# Patient Record
Sex: Male | Born: 1955 | Race: White | Hispanic: No | Smoking: Never smoker
Health system: Southern US, Community
[De-identification: ages and names within clinical notes are randomized; demographics above are authoritative.]

## PROBLEM LIST (undated history)

## (undated) DIAGNOSIS — K219 Gastro-esophageal reflux disease without esophagitis: Secondary | ICD-10-CM

## (undated) DIAGNOSIS — E785 Hyperlipidemia, unspecified: Secondary | ICD-10-CM

## (undated) DIAGNOSIS — R002 Palpitations: Secondary | ICD-10-CM

## (undated) HISTORY — DX: Hyperlipidemia, unspecified: E78.5

## (undated) HISTORY — DX: Palpitations: R00.2

## (undated) HISTORY — DX: Gastro-esophageal reflux disease without esophagitis: K21.9

## (undated) HISTORY — PX: NASAL SINUS SURGERY: SHX719

## (undated) HISTORY — PX: OTHER SURGICAL HISTORY: SHX169

---

## 2000-12-13 ENCOUNTER — Encounter: Payer: Self-pay | Admitting: *Deleted

## 2000-12-13 ENCOUNTER — Encounter: Admission: RE | Admit: 2000-12-13 | Discharge: 2000-12-13 | Payer: Self-pay | Admitting: *Deleted

## 2000-12-14 ENCOUNTER — Ambulatory Visit (HOSPITAL_COMMUNITY): Admission: RE | Admit: 2000-12-14 | Discharge: 2000-12-14 | Payer: Self-pay | Admitting: *Deleted

## 2002-06-04 ENCOUNTER — Encounter: Payer: Self-pay | Admitting: Orthopedic Surgery

## 2002-06-04 ENCOUNTER — Ambulatory Visit (HOSPITAL_COMMUNITY): Admission: RE | Admit: 2002-06-04 | Discharge: 2002-06-04 | Payer: Self-pay | Admitting: Orthopedic Surgery

## 2004-05-04 ENCOUNTER — Encounter: Admission: RE | Admit: 2004-05-04 | Discharge: 2004-05-04 | Payer: Self-pay | Admitting: Family Medicine

## 2006-09-21 ENCOUNTER — Encounter: Admission: RE | Admit: 2006-09-21 | Discharge: 2006-09-21 | Payer: Self-pay | Admitting: Family Medicine

## 2007-06-24 ENCOUNTER — Encounter: Payer: Self-pay | Admitting: Cardiovascular Disease

## 2010-12-16 ENCOUNTER — Encounter: Payer: Self-pay | Admitting: Cardiovascular Disease

## 2010-12-16 ENCOUNTER — Ambulatory Visit (INDEPENDENT_AMBULATORY_CARE_PROVIDER_SITE_OTHER): Payer: Managed Care, Other (non HMO) | Admitting: Cardiovascular Disease

## 2010-12-16 DIAGNOSIS — Z87898 Personal history of other specified conditions: Secondary | ICD-10-CM | POA: Insufficient documentation

## 2010-12-16 DIAGNOSIS — R002 Palpitations: Secondary | ICD-10-CM

## 2010-12-22 NOTE — Assessment & Plan Note (Signed)
Summary: palpitations/[facility] [phone]/pt [phone]/mt   Visit Type:  Initial Consult Primary Provider:  Dr. Cam Hai  CC:  palpitations.  History of Present Illness: 55 yo WM with history of hyperlipidemia, GERD who is here today as a self referral for evaluation of palpitations. He tells me that he has noticed his heart skipping beats in the past. He has had a workup at [facility] in the past. He exercised on the treadmill several years ago and had no ischemia. 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 He describes skipped beats but no chest pain, SOB, near syncope or syncope. He notices skipped beats several times per week. He does drink several caffeinated sodas per day. He avoids any stimulants.   Current Medications (verified): 1)  Acyclovir 400 Mg Tabs (Acyclovir) .Marland Kitchen.. 1 Tab Two Times A Day 2)  Protonix 40 Mg Tbec (Pantoprazole Sodium) .... Take 1 Tablet By Mouth Once A Day 3)  Atorvastatin Calcium 10 Mg Tabs (Atorvastatin Calcium) .... 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  The patient denies fatigue, malaise, fever, weight gain/loss, vision loss, decreased hearing, hoarseness, chest pain, shortness of breath, prolonged cough, wheezing, sleep apnea, coughing up blood, abdominal pain, blood in stool, nausea, vomiting, diarrhea, heartburn, incontinence, blood in urine, muscle weakness, joint pain, leg swelling, rash, skin lesions, headache, fainting, dizziness, depression, anxiety, enlarged lymph nodes, easy bruising or bleeding, and environmental allergies.    Vital  Signs:  Patient profile:   55 year old male Height:      69 inches Weight:      217.25 pounds BMI:     32.20 Pulse rate:   78 / minute Resp:     17 per minute BP sitting:   122 / 82  (left arm)  Vitals Entered By: Celestia Khat, CMA (December 16, 2010 10:24 AM)  Physical Exam  General:  General: Well developed, well nourished, NAD HEENT: OP clear, mucus membranes moist SKIN: warm, dry Neuro: No focal deficits Musculoskeletal: Muscle strength 5/5 all ext Psychiatric: Mood and affect normal Neck: No JVD, no carotid bruits, no thyromegaly, no lymphadenopathy. Lungs:Clear bilaterally, no wheezes, rhonci, crackles CV: RRR no murmurs, gallops rubs Abdomen: soft, NT, ND, BS present Extremities: No edema, pulses 2+.    EKG  Procedure date:  12/16/2010  Findings:      sinus rhythm.   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 Will arrange echo to assess LV function. He does not wish to wear a monitor at this time.   Orders: EKG w/ Interpretation (93000) Echocardiogram (Echo)  Patient Instructions: 1)  Your physician recommends that you schedule a follow-up appointment as needed. 2)  Your physician recommends that you continue on your current medications as directed. Please refer to the Current Medication list given to you today. 3)  Your physician has requested that you have an echocardiogram.  Echocardiography is a painless test that uses sound waves to create images of your heart. It provides your doctor with information about the size and shape of your heart and how well  your heart's chambers and valves are working.  This procedure takes approximately one hour. There are no restrictions for this procedure.

## 2010-12-26 ENCOUNTER — Ambulatory Visit (HOSPITAL_COMMUNITY): Payer: Managed Care, Other (non HMO) | Attending: Cardiovascular Disease

## 2010-12-26 DIAGNOSIS — K219 Gastro-esophageal reflux disease without esophagitis: Secondary | ICD-10-CM | POA: Insufficient documentation

## 2010-12-26 DIAGNOSIS — R002 Palpitations: Secondary | ICD-10-CM | POA: Insufficient documentation

## 2010-12-26 DIAGNOSIS — I519 Heart disease, unspecified: Secondary | ICD-10-CM | POA: Insufficient documentation

## 2011-01-11 ENCOUNTER — Telehealth: Payer: Self-pay | Admitting: Cardiovascular Disease

## 2011-01-11 DIAGNOSIS — R002 Palpitations: Secondary | ICD-10-CM

## 2011-01-11 NOTE — Telephone Encounter (Signed)
Patient has been experiencing more palpitations. They are coming off & on. No SOB or CP. Denies a lot of caffeine use. He is still well hydrated. Spoke with Dr. Clifton James and we will order a 48 hour holter monitor.

## 2011-01-13 ENCOUNTER — Telehealth: Payer: Self-pay

## 2011-01-13 NOTE — Telephone Encounter (Signed)
Patient  call back and said he is  not having  Any more palpitations and don't need monitor now.

## 2011-08-17 ENCOUNTER — Telehealth: Payer: Self-pay | Admitting: Cardiovascular Disease

## 2011-08-17 NOTE — Telephone Encounter (Signed)
New message  Pt said he was lifting weights and now his chest is hurting,he has no sob but he is concerned. He was taking tylenol and it goes away but comes back. Pleas call

## 2011-08-17 NOTE — Telephone Encounter (Signed)
Called stating he was lifting weights last week and ever since then has had some chest discomfort. No SOB, "no really chest pain". BP good. 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 Has taken Aleve once or twice and helps for awhile. Advised to take Aleve consistently for a few days. If pain doesn't resolve or gets more symptotic ie SOB,dizziness,chest pain then advised to go to ER. Otherwise he will call us back and will be worked in. Marland Kitchen

## 2011-08-23 ENCOUNTER — Encounter: Payer: Self-pay | Admitting: *Deleted

## 2011-08-24 ENCOUNTER — Ambulatory Visit (INDEPENDENT_AMBULATORY_CARE_PROVIDER_SITE_OTHER): Payer: Managed Care, Other (non HMO) | Admitting: Physician Assistant

## 2011-08-24 ENCOUNTER — Encounter: Payer: Self-pay | Admitting: Physician Assistant

## 2011-08-24 VITALS — BP 144/80 | HR 96 | Ht 70.0 in | Wt 223.0 lb

## 2011-08-24 DIAGNOSIS — R030 Elevated blood-pressure reading, without diagnosis of hypertension: Secondary | ICD-10-CM

## 2011-08-24 DIAGNOSIS — R079 Chest pain, unspecified: Secondary | ICD-10-CM

## 2011-08-24 DIAGNOSIS — E785 Hyperlipidemia, unspecified: Secondary | ICD-10-CM

## 2011-08-24 NOTE — Assessment & Plan Note (Signed)
Atypical > typical features.  He is adopted but knows his grandfather had CAD.  He has HLP and is intolerant to most meds due to muscle pain.  With his risk factors, I have suggested he undergo an ETT-Myoview.  If negative he can follow up PRN.

## 2011-08-24 NOTE — Assessment & Plan Note (Signed)
Managed by PCP.  I have suggest he try taking his statin 3 x a week to help with his side effects.  He will notify his PCP that he is trying this.

## 2011-08-24 NOTE — Assessment & Plan Note (Signed)
Took Mucinex D recently for his URI.  Likely related to this as prior BPs ok.

## 2011-08-24 NOTE — Patient Instructions (Addendum)
Your physician recommends that you schedule a follow-up appointment in: AS NEEDED  Your physician has requested that you have en exercise stress myoview DX [phone] CHEST PAIN. For further information please visit [email or web address] Please follow instruction sheet, as given.

## 2011-08-24 NOTE — Progress Notes (Signed)
History of Present Illness: PCP:  Dr. Cam Hai Primary Cardiologist:  Dr. Verne Carrow   Jim Graham is a 55 y.o. male with a h/o HLP and GERD.  He saw Dr. Clifton James in 3/12 for evaluation of palpitations.  He was apparently dx with PVCs when seen at [facility].  Echo 3/12: mild LVH, EF 60-65%, grade 1 diast dysfxn.  Patient called in after that visit with palpitations and was set up for a Holter but he canceled this as his symptoms improved.  He presents today for evaluation of chest pain.  He was placed on amoxicillin for a URI recently.  Since then, he has noted chest discomfort.  It is an ache or tightness.  Located anterior chest.  Radiates to bilateral shoulders and arms.  Pain does not occur with running.  He lifts weights and will note pain sometimes with this, but more often after finishing his workout.  No associated dyspnea, nausea or diaphoresis.  Pain lasts 1-2 hours.  Takes aleve with relief.  No syncope, orthopnea, PND, edema.    Past Medical History  Diagnosis Date  . Hyperlipidemia   . GERD (gastroesophageal reflux disease)   . Palpitations     Current Outpatient Prescriptions  Medication Sig Dispense Refill  . acyclovir (ZOVIRAX) 400 MG tablet Take 400 mg by mouth every 4 (four) hours while awake.        Marland Kitchen aspirin 81 MG tablet Take 81 mg by mouth daily.        . CRESTOR 5 MG tablet Take 1 tablet by mouth Daily.      . fluticasone (FLONASE) 50 MCG/ACT nasal spray as needed.      . multivitamin (THERAGRAN) per tablet Take 1 tablet by mouth daily.        Marland Kitchen NEXIUM 40 MG capsule Take 1 tablet by mouth Daily.        Allergies: No Known Allergies  History  Substance Use Topics  . Smoking status: Never Smoker   . Smokeless tobacco: Never Used  . Alcohol Use: No     Family History  Problem Relation Age of Onset  . Adopted: Yes  . Heart attack Paternal Grandfather      ROS:  Please see the history of present illness.   Gastrointestinal ROS: positive for -  blood in stools from hemorrhoids and diarrhea.  All other systems reviewed and negative.   Vital Signs: BP 144/80  Pulse 96  Ht 5\' 10"  (1.778 m)  Wt 223 lb (101.152 kg)  BMI 32.00 kg/m2  PHYSICAL EXAM: Well nourished, well developed, in no acute distress HEENT: normal Neck: no JVD Vascular: no carotid bruits Endocrine: no thyromegaly Cardiac:  normal S1, S2; RRR; no murmur Lungs:  clear to auscultation bilaterally, no wheezing, rhonchi or rales Abd: soft, nontender, no hepatomegaly Ext: no edema Skin: warm and dry Neuro:  CNs 2-12 intact, no focal abnormalities noted Psych: normal affect  EKG:  NSR, HR 96, no ischemic changes, no significant change since prior tracing  ASSESSMENT AND PLAN:

## 2011-09-01 ENCOUNTER — Telehealth: Payer: Self-pay | Admitting: Physician Assistant

## 2011-09-01 ENCOUNTER — Other Ambulatory Visit: Payer: Self-pay | Admitting: Physician Assistant

## 2011-09-01 ENCOUNTER — Telehealth: Payer: Self-pay | Admitting: *Deleted

## 2011-09-01 DIAGNOSIS — R079 Chest pain, unspecified: Secondary | ICD-10-CM

## 2011-09-01 NOTE — Telephone Encounter (Signed)
Ok Katrese Shell, PA-C  

## 2011-09-01 NOTE — Telephone Encounter (Signed)
PATIENT WAS SCHEDULED FOR A STRESS MYOVIEW ON 09/04/11.  [employer] DENIED DUE TO NORMAL EKG AND ABILITY TO WALK ON A TREADMILL.  PER SCOTT CHANGE TO A STRESS ECHO.  KEVIN JOHNSON SPOKE WITH THE PATIENT AND SCHEDULED HIM FOR A STRESS ECHO ON 09/11/11.  KEVIN STATES PATIENT WAS OKAY WITH THIS AND VERBALIZED UNDERSTANDING OF WHY THE TEST WAS CHANGED.

## 2011-09-01 NOTE — Telephone Encounter (Signed)
Wanted to note that Dr. Bosie Clos from [facility] doubled his nexium prescription from 40mg  to 80mg  daily his heartburn is still persistant. Please feel free to call back if you have any questions.  Danielle Rankin

## 2011-09-01 NOTE — Telephone Encounter (Signed)
Wanted to note that Dr. Bosie Clos from [facility] doubled his nexium prescription from 40mg  to 80mg  daily his heartburn is still persistant. Please feel free to call back if you have any questions.

## 2011-09-04 ENCOUNTER — Encounter (HOSPITAL_COMMUNITY): Payer: Managed Care, Other (non HMO) | Admitting: Radiology

## 2011-09-11 ENCOUNTER — Ambulatory Visit (HOSPITAL_BASED_OUTPATIENT_CLINIC_OR_DEPARTMENT_OTHER): Payer: Managed Care, Other (non HMO) | Admitting: Radiology

## 2011-09-11 ENCOUNTER — Ambulatory Visit (HOSPITAL_COMMUNITY): Payer: Managed Care, Other (non HMO) | Attending: Cardiology | Admitting: Radiology

## 2011-09-11 DIAGNOSIS — E785 Hyperlipidemia, unspecified: Secondary | ICD-10-CM | POA: Insufficient documentation

## 2011-09-11 DIAGNOSIS — R072 Precordial pain: Secondary | ICD-10-CM

## 2011-09-11 DIAGNOSIS — R002 Palpitations: Secondary | ICD-10-CM | POA: Insufficient documentation

## 2011-09-11 DIAGNOSIS — R0989 Other specified symptoms and signs involving the circulatory and respiratory systems: Secondary | ICD-10-CM

## 2011-09-11 DIAGNOSIS — R079 Chest pain, unspecified: Secondary | ICD-10-CM | POA: Insufficient documentation

## 2011-09-11 DIAGNOSIS — I4949 Other premature depolarization: Secondary | ICD-10-CM | POA: Insufficient documentation

## 2011-09-13 ENCOUNTER — Other Ambulatory Visit (HOSPITAL_COMMUNITY): Payer: Managed Care, Other (non HMO) | Admitting: Radiology

## 2011-10-18 ENCOUNTER — Other Ambulatory Visit: Payer: Self-pay | Admitting: Family Medicine

## 2011-10-18 DIAGNOSIS — R109 Unspecified abdominal pain: Secondary | ICD-10-CM

## 2011-10-20 ENCOUNTER — Ambulatory Visit
Admission: RE | Admit: 2011-10-20 | Discharge: 2011-10-20 | Disposition: A | Discharge status: Home / Self Care | Payer: Managed Care, Other (non HMO) | Source: Ambulatory Visit | Attending: Family Medicine | Admitting: Family Medicine

## 2011-10-20 DIAGNOSIS — R109 Unspecified abdominal pain: Secondary | ICD-10-CM

## 2012-04-16 ENCOUNTER — Other Ambulatory Visit: Payer: Self-pay | Admitting: Family Medicine

## 2012-04-16 DIAGNOSIS — M545 Low back pain: Secondary | ICD-10-CM

## 2012-04-17 ENCOUNTER — Ambulatory Visit
Admission: RE | Admit: 2012-04-17 | Discharge: 2012-04-17 | Disposition: A | Discharge status: Home / Self Care | Payer: Managed Care, Other (non HMO) | Source: Ambulatory Visit | Attending: Family Medicine | Admitting: Family Medicine

## 2012-04-17 DIAGNOSIS — M545 Low back pain: Secondary | ICD-10-CM

## 2012-07-01 ENCOUNTER — Ambulatory Visit (HOSPITAL_BASED_OUTPATIENT_CLINIC_OR_DEPARTMENT_OTHER)
Admission: RE | Admit: 2012-07-01 | Discharge: 2012-07-01 | Disposition: A | Discharge status: Home / Self Care | Payer: Self-pay | Source: Ambulatory Visit | Attending: Occupational Medicine | Admitting: Occupational Medicine

## 2012-07-01 ENCOUNTER — Other Ambulatory Visit: Payer: Self-pay | Admitting: Occupational Medicine

## 2012-07-01 DIAGNOSIS — Z0000 Encounter for general adult medical examination without abnormal findings: Secondary | ICD-10-CM

## 2013-08-22 ENCOUNTER — Ambulatory Visit (INDEPENDENT_AMBULATORY_CARE_PROVIDER_SITE_OTHER): Payer: 59 | Admitting: Psychology

## 2013-08-22 DIAGNOSIS — F331 Major depressive disorder, recurrent, moderate: Secondary | ICD-10-CM

## 2013-09-03 ENCOUNTER — Ambulatory Visit: Payer: Self-pay | Admitting: Psychology

## 2013-09-03 ENCOUNTER — Ambulatory Visit (INDEPENDENT_AMBULATORY_CARE_PROVIDER_SITE_OTHER): Payer: 59 | Admitting: Psychology

## 2013-09-03 DIAGNOSIS — F331 Major depressive disorder, recurrent, moderate: Secondary | ICD-10-CM

## 2014-10-14 ENCOUNTER — Ambulatory Visit (INDEPENDENT_AMBULATORY_CARE_PROVIDER_SITE_OTHER): Payer: 59 | Admitting: Psychology

## 2014-10-14 DIAGNOSIS — F332 Major depressive disorder, recurrent severe without psychotic features: Secondary | ICD-10-CM

## 2014-10-30 ENCOUNTER — Ambulatory Visit (INDEPENDENT_AMBULATORY_CARE_PROVIDER_SITE_OTHER): Payer: 59 | Admitting: Psychology

## 2014-10-30 DIAGNOSIS — F332 Major depressive disorder, recurrent severe without psychotic features: Secondary | ICD-10-CM

## 2014-11-10 ENCOUNTER — Ambulatory Visit (INDEPENDENT_AMBULATORY_CARE_PROVIDER_SITE_OTHER): Payer: Managed Care, Other (non HMO)

## 2014-11-10 VITALS — BP 153/93 | HR 86 | Resp 13 | Ht 70.0 in | Wt 220.0 lb

## 2014-11-10 DIAGNOSIS — M7732 Calcaneal spur, left foot: Secondary | ICD-10-CM

## 2014-11-10 DIAGNOSIS — M79672 Pain in left foot: Secondary | ICD-10-CM

## 2014-11-10 DIAGNOSIS — M722 Plantar fascial fibromatosis: Secondary | ICD-10-CM

## 2014-11-10 NOTE — Progress Notes (Signed)
   Subjective:    Patient ID: Jim Graham, male    DOB: 1955/10/24, 59 y.o.   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  Dr. Elijah Birkom has injected the left heel 6 times per pt, and ultrasound therapy, and prescription orthotics without relief.     Review of Systems  Musculoskeletal: Positive for back pain.       Back pain and taking Aleve bid.  All other systems reviewed and are negative.      Objective:   Physical Exam 59 year old white male well-developed well-nourished oriented 3 presents this time with neurovascular status is intact pedal pulses are palpable epicritic and proprioceptive sensations intact and symmetric. Patient been seen by Dr. Maisie Fushomas had 6 steroid injections for his inferior left heel pain on exam at this time indicates is actually feeling better still has tenderness and mid arch medial band of the plantar fascia left right is however this is compensatory due to gait changes and walking on his toes. There is x-rays reveal well-developed inferior calcaneal spur mild fascial thickening rectus foot type mild rotatory changes noted on weightbearing clinically and radiographically. Patient is wearing orthoses however on exam orthotics did not contour well to the foot and arch patient did a standing bio foam impression and I feel captured a pronated foot position rather than a neutral position. At this time orthotics are modified with a felt padding to the medial arch of both orthotics left more so than right and patient was placed in a fascial strapping.       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 However if it is improved and successful biomechanical support new orthoses or adjustment orthotic may be beneficial in the future. Reassess with the next 2 weeks for follow-up  Alvan Dameichard Josiane Labine DPM

## 2014-11-10 NOTE — Patient Instructions (Signed)

## 2014-11-20 ENCOUNTER — Ambulatory Visit (INDEPENDENT_AMBULATORY_CARE_PROVIDER_SITE_OTHER): Payer: 59 | Admitting: Psychology

## 2014-11-20 DIAGNOSIS — F332 Major depressive disorder, recurrent severe without psychotic features: Secondary | ICD-10-CM

## 2014-12-01 ENCOUNTER — Ambulatory Visit (INDEPENDENT_AMBULATORY_CARE_PROVIDER_SITE_OTHER): Payer: Managed Care, Other (non HMO)

## 2014-12-01 VITALS — BP 126/82 | HR 90 | Resp 12

## 2014-12-01 DIAGNOSIS — M7732 Calcaneal spur, left foot: Secondary | ICD-10-CM

## 2014-12-01 DIAGNOSIS — M722 Plantar fascial fibromatosis: Secondary | ICD-10-CM

## 2014-12-01 DIAGNOSIS — M79672 Pain in left foot: Secondary | ICD-10-CM

## 2014-12-01 NOTE — Progress Notes (Signed)
   Subjective:    Patient ID: Jim Graham, male    DOB: 1956-05-20, 59 y.o.   MRN: [id number]  HPI  ''LT FOOT IS DOING MUCH BETTER BUT STILL GET A LITTLE SORE,  Review of Systems no new findings or systemic changes noted     Objective:   Physical Exam Neurovascular status unchanged pedal pulses are palpable the strapping has significant improvement both heels are definitely much better than they were based on the fact that the strapping in the orthotic adjustment was beneficial patient is a strong candidate for new orthoses as alternative to surgery at this time orthotic scan will be carried out with a new functional Sorbothane type orthotic. The orthotic with a high arch with no extrinsic post. If successful, will likely not require surgery for his heel pain. At this time fascial strapping reapplied on an as-needed basis until orthotics arrive orthotic scan carried out per patient request at this time.        AAssessment plantar fasciitis/heel spur syndrome bilateral orthotic scan carried out per patient request may be not be covered by his insurance R patient is prepared to pay if available and successful will be able to avoid surgery however if not successful long-term patient may still require EPF procedure. Reevaluate within 3 or 4 weeks once orthotics are ready for fitting and dispensing  Alvan DameRichard Taleeya Graham Graham Neosho HospitalDPMssessment & Plan:

## 2014-12-01 NOTE — Patient Instructions (Signed)
ICE INSTRUCTIONS  Apply ice or cold pack to the affected area at least 3 times a day for 10-15 minutes each time.  You should also use ice after prolonged activity or vigorous exercise.  Do not apply ice longer than 20 minutes at one time.  Always keep a cloth between your skin and the ice pack to prevent burns.  Being consistent and following these instructions will help control your symptoms.  We suggest you purchase a gel ice pack because they are reusable and do bit leak.  Some of them are designed to wrap around the area.  Use the method that works best for you.  Here are some other suggestions for icing.   Use a frozen bag of peas or corn-inexpensive and molds well to your body, usually stays frozen for 10 to 20 minutes.  Wet a towel with cold water and squeeze out the excess until it's damp.  Place in a bag in the freezer for 20 minutes. Then remove and use.  Follow-up with additional strapping if needed if there is a flareup until the orthotics arrive.

## 2014-12-28 ENCOUNTER — Encounter: Payer: Self-pay | Admitting: Internal Medicine

## 2014-12-28 ENCOUNTER — Ambulatory Visit (INDEPENDENT_AMBULATORY_CARE_PROVIDER_SITE_OTHER): Payer: Managed Care, Other (non HMO) | Admitting: Internal Medicine

## 2014-12-28 ENCOUNTER — Telehealth: Payer: Self-pay | Admitting: Internal Medicine

## 2014-12-28 VITALS — BP 130/98 | HR 69 | Ht 69.0 in | Wt 217.6 lb

## 2014-12-28 DIAGNOSIS — Z8679 Personal history of other diseases of the circulatory system: Secondary | ICD-10-CM

## 2014-12-28 DIAGNOSIS — E785 Hyperlipidemia, unspecified: Secondary | ICD-10-CM

## 2014-12-28 DIAGNOSIS — Z87898 Personal history of other specified conditions: Secondary | ICD-10-CM

## 2014-12-28 DIAGNOSIS — R0789 Other chest pain: Secondary | ICD-10-CM

## 2014-12-28 NOTE — Patient Instructions (Addendum)
Dr. Rennis GoldenHilty has scheduled you to have a CTA calcium score. This will be done at our [address] office. A cardiac CT scan is a non-invasive way of obtaining information about the presence, location and extent of calcified plaque in the coronary arteries, the vessels that supply oxygen-containing blood to the heart muscle.  Your physician wants you to follow-up in: 1 year or sooner if needed.You will receive a reminder letter in the mail two months in advance. If you don't receive a letter, please call our office to schedule the follow-up appointment.

## 2014-12-28 NOTE — Progress Notes (Signed)
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 He is also on chronic suppressive therapy for HSV. He was praised evaluated for atypical chest pain and palpitations in 2012. He underwent an exercise echocardiogram which was negative for ischemia. He also wore monitor for. At time. Symptoms improved and were related to stress. He does not know his medical history secondary to being adopted. Recently he's had more discomfort mostly in his left shoulder and some in the left upper chest but no evidence for substernal chest pain. He's also been having worsening symptoms which she attributes to reflux. He was switched from Nexium to Dexilant. In addition, he notes that when he takes Zantac he has marked improvement in his symptoms. 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  PMHx:  Past Medical History  Diagnosis Date  . Hyperlipidemia   . GERD (gastroesophageal reflux disease)   . Palpitations     Past Surgical History  Procedure Laterality Date  . Bicep re-attachment    . Nasal sinus surgery      FAMHx:  Family History  Problem Relation Age of Onset  . Adopted: Yes  . Heart attack Paternal Grandfather     SOCHx:   reports that he has never smoked. He has never used smokeless tobacco. He reports that he does not drink alcohol or use illicit drugs.  ALLERGIES:  No Known Allergies  ROS: A comprehensive review of systems was negative except for: Cardiovascular: positive for chest pain Gastrointestinal: positive for reflux symptoms  HOME MEDS: Current Outpatient Prescriptions  Medication Sig Dispense Refill  . acyclovir (ZOVIRAX) 400 MG tablet Take 400 mg by mouth every 4  (four) hours while awake.      Marland Kitchen aspirin 81 MG tablet Take 81 mg by mouth daily.      . CRESTOR 5 MG tablet Take 1 tablet by mouth Daily.    Marland Kitchen dexlansoprazole (DEXILANT) 60 MG capsule Take 60 mg by mouth daily.    . multivitamin (THERAGRAN) per tablet Take 1 tablet by mouth daily.      Marland Kitchen omega-3 acid ethyl esters (LOVAZA) 1 G capsule Take 1 capsule by mouth daily.    . valACYclovir (VALTREX) 500 MG tablet Take 1 tablet by mouth 2 (two) times daily.     No current facility-administered medications for this visit.    LABS/IMAGING: No results found for this or any previous visit (from the past 48 hour(s)). No results found.  VITALS: BP 130/98 mmHg  Pulse 69  Ht  (1.753 m)  Wt 217 lb 9.6 oz (98.703 kg)  BMI 32.12 kg/m2  EXAM: General appearance: alert and no distress Neck: no carotid bruit, no JVD and thyroid not enlarged, symmetric, no tenderness/mass/nodules Lungs: clear to auscultation bilaterally Heart: regular rate and rhythm, S1, S2 normal, no murmur, click, rub or gallop Abdomen: soft, non-tender; bowel sounds normal; no masses,  no organomegaly Extremities: extremities normal, atraumatic, no cyanosis or edema Pulses: 2+ and symmetric Skin: Skin color, texture, turgor normal. No rashes or lesions Neurologic: Grossly normal Psych: Mildly anxious  EKG:  Normal sinus rhythm at 69  ASSESSMENT: 1. Atypical left upper chest/shoulder pain 2. GERD 3. Dyslipidemia  PLAN: 1.   Jim Graham is reporting atypical chest discomfort which does not sound cardiac. He continues to exercise and does a lot of aerobic and strength training without any symptoms. This would be very unusual if his chest pain were cardiac in nature. I suspect is probably related to reflux, as he reports he gets some improvement in his symptoms when taking Zantac. I advised him it is okay to take both Zantac and Dexilant for his ongoing reflux symptoms. He should follow-up with his gastroenterologist. He is  interested as to whether or not he has any coronary disease.  I recommended a coronary calcium score to help better advise him. Should he have no coronary calcium, his risk would be very low, and we may be able to adjust his cholesterol medicine some. If he has a very high coronary calcium score, further risk stratification and risk medication would be necessary.  Thanks for allowing me to participate in his care.  Chrystie NoseKenneth C. Hilty, MD, [facility] HospitalFACC Attending Cardiologist [facility]  HILTY,Kenneth C 12/28/2014, 1:03 PM

## 2014-12-28 NOTE — Telephone Encounter (Signed)
Called and spoke with patient regarding CT [facility] [address]  Scheduled for 01/05/15 @ 8:30 am  Arrive at 8:15 for check in and be sure to bring $150.00 since insurance does not cover this service.  Patient voiced his understanding.

## 2015-01-01 ENCOUNTER — Ambulatory Visit: Payer: Managed Care, Other (non HMO) | Admitting: *Deleted

## 2015-01-01 DIAGNOSIS — M79673 Pain in unspecified foot: Secondary | ICD-10-CM

## 2015-01-01 DIAGNOSIS — M722 Plantar fascial fibromatosis: Secondary | ICD-10-CM

## 2015-01-01 NOTE — Patient Instructions (Signed)

## 2015-01-01 NOTE — Progress Notes (Signed)
Patient ID: Jim Graham, male   DOB: May 19, 1956, 59 y.o.   MRN: [id number] PICKING UP INSERTS

## 2015-01-05 ENCOUNTER — Ambulatory Visit (INDEPENDENT_AMBULATORY_CARE_PROVIDER_SITE_OTHER)
Admission: RE | Admit: 2015-01-05 | Discharge: 2015-01-05 | Disposition: A | Discharge status: Home / Self Care | Payer: Managed Care, Other (non HMO) | Source: Ambulatory Visit | Attending: Internal Medicine | Admitting: Internal Medicine

## 2015-01-05 DIAGNOSIS — E785 Hyperlipidemia, unspecified: Secondary | ICD-10-CM

## 2015-01-12 ENCOUNTER — Telehealth: Payer: Self-pay | Admitting: Internal Medicine

## 2015-01-12 NOTE — Telephone Encounter (Signed)
Called, attempted to speak to pt, line d/c'ed.

## 2015-01-12 NOTE — Telephone Encounter (Signed)
[place]ing [place] call from [place],concerning his test results.

## 2015-01-12 NOTE — Telephone Encounter (Signed)
Returned call to patient no answer.LMTC. 

## 2015-01-13 ENCOUNTER — Telehealth: Payer: Self-pay

## 2015-01-13 DIAGNOSIS — E785 Hyperlipidemia, unspecified: Secondary | ICD-10-CM

## 2015-01-13 NOTE — Telephone Encounter (Signed)
Pt is calling back to get the results to his CT CA score.

## 2015-01-13 NOTE — Telephone Encounter (Signed)
Returned call to patient received labs from PCP.Last Lipid Panel 06/11/14 LDL 93.Advised will need current lipid panel.Stated he will have a fasting lipid panel this [facility].

## 2015-01-13 NOTE — Telephone Encounter (Signed)
Received a call from patient Ct cardiac scoring results given.Appointment scheduled 02/08/15 with Dr.Hilty to discuss.Lab work with PCP requested.

## 2015-01-15 ENCOUNTER — Other Ambulatory Visit: Payer: Self-pay | Admitting: *Deleted

## 2015-01-15 ENCOUNTER — Telehealth: Payer: Self-pay | Admitting: Internal Medicine

## 2015-01-15 DIAGNOSIS — E785 Hyperlipidemia, unspecified: Secondary | ICD-10-CM

## 2015-01-15 LAB — LIPID PANEL
CHOL/HDL RATIO: 3.3 ratio
Cholesterol: 140 mg/dL (ref 0–200)
HDL: 43 mg/dL (ref >=40)
LDL Cholesterol: 84 mg/dL (ref 0–99)
Triglycerides: 66 mg/dL (ref <150)
VLDL: 13 mg/dL (ref 0–40)

## 2015-01-15 NOTE — Telephone Encounter (Signed)
Lab order released, [facility] confirmed.

## 2015-01-21 ENCOUNTER — Encounter: Payer: Self-pay | Admitting: Cardiovascular Disease

## 2015-02-08 ENCOUNTER — Ambulatory Visit (INDEPENDENT_AMBULATORY_CARE_PROVIDER_SITE_OTHER): Payer: Managed Care, Other (non HMO) | Admitting: Internal Medicine

## 2015-02-08 ENCOUNTER — Encounter: Payer: Self-pay | Admitting: Internal Medicine

## 2015-02-08 VITALS — BP 120/88 | HR 76 | Ht 70.0 in | Wt 195.3 lb

## 2015-02-08 DIAGNOSIS — E785 Hyperlipidemia, unspecified: Secondary | ICD-10-CM | POA: Diagnosis not present

## 2015-02-08 DIAGNOSIS — R938 Abnormal findings on diagnostic imaging of other specified body structures: Secondary | ICD-10-CM

## 2015-02-08 DIAGNOSIS — I77819 Aortic ectasia, unspecified site: Secondary | ICD-10-CM

## 2015-02-08 DIAGNOSIS — I77810 Thoracic aortic ectasia: Secondary | ICD-10-CM

## 2015-02-08 DIAGNOSIS — N529 Male erectile dysfunction, unspecified: Secondary | ICD-10-CM | POA: Insufficient documentation

## 2015-02-08 DIAGNOSIS — R918 Other nonspecific abnormal finding of lung field: Secondary | ICD-10-CM | POA: Diagnosis not present

## 2015-02-08 DIAGNOSIS — R030 Elevated blood-pressure reading, without diagnosis of hypertension: Secondary | ICD-10-CM | POA: Diagnosis not present

## 2015-02-08 DIAGNOSIS — N528 Other male erectile dysfunction: Secondary | ICD-10-CM

## 2015-02-08 DIAGNOSIS — R931 Abnormal findings on diagnostic imaging of heart and coronary circulation: Secondary | ICD-10-CM

## 2015-02-08 DIAGNOSIS — IMO0001 Reserved for inherently not codable concepts without codable children: Secondary | ICD-10-CM

## 2015-02-08 MED ORDER — SILDENAFIL CITRATE 20 MG PO TABS: 40.0000 mg | ORAL_TABLET | Freq: Every day | ORAL | Status: DC | PRN

## 2015-02-08 NOTE — Patient Instructions (Signed)
Your physician recommends that you return for lab work in: 2 months (fasting)  Non-Cardiac CT scanning, (CAT scanning), is a noninvasive, special x-ray that produces cross-sectional images of the body using x-rays and a computer. CT scans help physicians diagnose and treat medical conditions. For some CT exams, a contrast material is used to enhance visibility in the area of the body being studied. CT scans provide greater clarity and reveal more details than regular x-ray exams. >> this with be a CT of your chest done in 1 year ~ April 2017  Your physician wants you to follow-up in: 1 year with Dr. Rennis GoldenHilty. You will receive a reminder letter in the mail two months in advance. If you don't receive a letter, please call our office to schedule the follow-up appointment.

## 2015-02-08 NOTE — Progress Notes (Signed)
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 He is also on chronic suppressive therapy for HSV. He was praised evaluated for atypical chest pain and palpitations in 2012. He underwent an exercise echocardiogram which was negative for ischemia. He also wore monitor for. At time. Symptoms improved and were related to stress. He does not know his medical history secondary to being adopted. Recently he's had more discomfort mostly in his left shoulder and some in the left upper chest but no evidence for substernal chest pain. He's also been having worsening symptoms which she attributes to reflux. He was switched from Nexium to Dexilant. In addition, he notes that when he takes Zantac he has marked improvement in his symptoms. 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  The pleasure see Mr. Jim CantorStrimple back in the office today. Overall he is doing well denies any further chest pain or shortness of breath. In fact she's managed to lose about 20 pounds since his last office visit. He says he's on the [employer] diet. This is a combination of caloric restriction, hCG, other vitamins and appetite suppressants. He also continues to exercise. He had a lipid profile performed which showed total cholesterol 140, glyceride 66, HL 43, LDL 84. He's currently on Crestor 5 mg daily. The CT scan of his coronary Arteries was performed which demonstrated a low coronary artery calcium score of 46, but was located in the proximal to mid LAD. The aortic root was mildly dilated 4.0 cm. [facility] indicated several small subcentimeter pulmonary nodules, the largest being 5 mm. A repeat CT  scan was recommended in one year. He is not a smoker and at lower risk for bronchogenic carcinoma. In addition, today he reported some recent erectile dysfunction, which was unrelated to her follow-up. He is asking about Viagra.  PMHx:  Past Medical History  Diagnosis Date  . Hyperlipidemia   . GERD (gastroesophageal reflux disease)   . Palpitations     Past Surgical History  Procedure Laterality Date  . Bicep re-attachment    . Nasal sinus surgery      FAMHx:  Family History  Problem Relation Age of Onset  . Adopted: Yes  . Heart attack Paternal Grandfather     SOCHx:   reports that he has never smoked. He has never used smokeless tobacco. He reports that he does not drink alcohol or use illicit drugs.  ALLERGIES:  No Known Allergies  ROS: A comprehensive review of systems was negative except for: Genitourinary: positive for sexual problems  HOME MEDS: Current Outpatient Prescriptions  Medication Sig Dispense Refill  . aspirin 81 MG tablet Take 81 mg by mouth daily.      . CRESTOR 5 MG tablet Take 1 tablet by mouth Daily.    Marland Kitchen. dexlansoprazole (DEXILANT) 60 MG capsule Take 60 mg by mouth daily.    . multivitamin (THERAGRAN) per tablet Take 1 tablet by mouth daily.      Marland Kitchen. omega-3 acid ethyl esters (LOVAZA) 1 G capsule Take 1 capsule by mouth daily.    . valACYclovir (VALTREX) 500 MG tablet Take 1 tablet by mouth 2 (two) times daily.    .Marland Kitchen  sildenafil (REVATIO) 20 MG tablet Take 2-3 tablets (40-60 mg total) by mouth daily as needed. 50 tablet 1   No current facility-administered medications for this visit.    LABS/IMAGING: No results found for this or any previous visit (from the past 48 hour(s)). No results found.  VITALS: BP 120/88 mmHg  Pulse 76  Ht  (1.778 m)  Wt 195 lb 4.8 oz (88.587 kg)  BMI 28.02 kg/m2  EXAM: Deferred  EKG: Deferred  ASSESSMENT: 1. Atypical left upper chest/shoulder pain - low coronary calcium score  46 2. GERD 3. Dyslipidemia 4. Erectile dysfunction  PLAN: 1.   [name] has had improvement in his chest discomfort. This may been related to reflux. He says currently he is taking Dexilant as needed. I told him it may be better to take either Zantac or Pepcid on an as-needed basis. He continues to exercise without any exertional chest discomfort. He does have a low coronary artery calcium score however it is located in the proximal to mid LAD. A review his lipid profile shows there is room for further risk stratification. He's made major dietary changes and has had over 20 pound weight loss. He wishes to reassess his cholesterol in a couple of months. I like to obtain a lipid NMR for more detailed analysis. There may be room to increase his Crestor to lower particle numbers and/or LDL below 70. He does need strict cholesterol controlled her reduce his ongoing risk. He inquired today about using Viagra and I feel that's safe. I'll go ahead and give him a prescription for the 20 mg tablets to take 2 or 3 prior to sexual activity. He is not using any nitrates and was advised against using them together.  I will contact him with the results of his cholesterol profile a couple months and adjust his medication as necessary. I'll also go ahead and schedule him for repeat chest CT in one year to follow-up on his pulmonary nodules and borderline dilated aortic root.  Thanks for allowing me to participate in his care.  Jim Nose, MD, [facility] Attending Cardiologist [facility]  Jim Graham 02/08/2015, 9:39 AM

## 2015-03-05 DIAGNOSIS — M722 Plantar fascial fibromatosis: Secondary | ICD-10-CM

## 2015-05-17 ENCOUNTER — Telehealth: Payer: Self-pay | Admitting: Internal Medicine

## 2015-05-17 NOTE — Telephone Encounter (Signed)
Pt called in stating that he has been having some spasms in his chest which work their way down to both arms . He is concerned and he would like to come in and be seen tomorrow.   Thanks

## 2015-05-17 NOTE — Telephone Encounter (Signed)
Patient said that on Saturday while in grocery store started have a spasm feeling in chest and in his arms lasting 30 min  This morning he started with same symptoms.  Took Zantac and symptoms have improved.  Said he hasn't taken Dex lansoprazole in months and is thinking about restarting.  Stated he drank a lot of iced tea this weekend and is attributing his symptoms to this especially after improvement with Zantac  Has been working out like he typically does lifting weights 5 days a week.  Describes discomfort in arms like a muscle pull.  Denies any shortness of breath or chest pain  Advised if his symptoms start returning,  He becomes weak or short of breath or pain becomes worse he needs to go to the ED.  Will send message to Dr. Rennis Golden to see if he would like for him to follow up in the office or any or changes in his plan of care

## 2015-05-20 NOTE — Telephone Encounter (Signed)
Patient called back and said that his symptoms have improved but he would like to be seen to discuss with physician  York Spaniel that Joyce Gross was to get back to him to see if she could get him an appointment next week  Will route to [employer]

## 2015-05-20 NOTE — Telephone Encounter (Signed)
This patient is calling back today stating his symptoms are improved, but he feels he needs to be seen.  Please call patient and advise scheduling if this should be scheduled on the flex schedule.

## 2015-05-24 NOTE — Telephone Encounter (Signed)
Patient is scheduled 05/25/15

## 2015-05-25 ENCOUNTER — Ambulatory Visit (INDEPENDENT_AMBULATORY_CARE_PROVIDER_SITE_OTHER): Payer: Managed Care, Other (non HMO) | Admitting: Internal Medicine

## 2015-05-25 ENCOUNTER — Encounter: Payer: Self-pay | Admitting: Internal Medicine

## 2015-05-25 VITALS — BP 126/100 | HR 67 | Ht 70.0 in | Wt 197.1 lb

## 2015-05-25 DIAGNOSIS — E785 Hyperlipidemia, unspecified: Secondary | ICD-10-CM

## 2015-05-25 DIAGNOSIS — R938 Abnormal findings on diagnostic imaging of other specified body structures: Secondary | ICD-10-CM

## 2015-05-25 DIAGNOSIS — R931 Abnormal findings on diagnostic imaging of heart and coronary circulation: Secondary | ICD-10-CM

## 2015-05-25 DIAGNOSIS — R0789 Other chest pain: Secondary | ICD-10-CM | POA: Diagnosis not present

## 2015-05-25 DIAGNOSIS — K219 Gastro-esophageal reflux disease without esophagitis: Secondary | ICD-10-CM | POA: Diagnosis not present

## 2015-05-25 NOTE — Patient Instructions (Signed)
Dr. Rennis Golden will follow up with you in April 2017  Please take dexilant once daily You can take zantac in addition to this as necessary Please contact Dr. Bosie Clos for your heartburn

## 2015-05-25 NOTE — Progress Notes (Signed)
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 He is also on chronic suppressive therapy for HSV. He was praised evaluated for atypical chest pain and palpitations in 2012. He underwent an exercise echocardiogram which was negative for ischemia. He also wore monitor for. At time. Symptoms improved and were related to stress. He does not know his medical history secondary to being adopted. Recently he's had more discomfort mostly in his left shoulder and some in the left upper chest but no evidence for substernal chest pain. He's also been having worsening symptoms which she attributes to reflux. He was switched from Nexium to Dexilant. In addition, he notes that when he takes Zantac he has marked improvement in his symptoms. 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  The pleasure see [name] back in the office today. Overall he is doing well denies any further chest pain or shortness of breath. In fact she's managed to lose about 20 pounds since his last office visit. He says he's on the [employer]. This is a combination of caloric restriction, hCG, other vitamins and appetite suppressants. He also continues to exercise. He had a lipid profile performed which showed total cholesterol 140, glyceride 66, HL 43, LDL 84. He's currently on Crestor 5 mg daily. The CT scan of his coronary Arteries was performed which demonstrated a low coronary artery calcium score of 46, but was located in the proximal to mid LAD. The aortic root was mildly dilated 4.0 cm. An over read by [facility] radiology indicated several small subcentimeter pulmonary nodules, the largest being 5 mm. A repeat  CT scan was recommended in one year. He is not a smoker and at lower risk for bronchogenic carcinoma. In addition, today he reported some recent erectile dysfunction, which was unrelated to her follow-up. He is asking about Viagra.  [name] returns today for follow-up. Over the weekend he had an episode where he developed pain on the left and right sides of his chest laterally that radiated down his arms. There is no central chest discomfort. It seemed to occurred fairly recently after eating. He did take a Zantac and went to the emergency room. He said he waited outside of the emergency room and his symptoms resolved and he ultimately was never seen. He then started taking his Dexilant again. He has noted some improvement in his symptoms but is not completely resolved. He says he recently saw his gastroenterologist but was not having any symptoms at the time. He continues to exercise at a significant rate without any chest pain or shortness of breath.  PMHx:  Past Medical History  Diagnosis Date  . Hyperlipidemia   . GERD (gastroesophageal reflux disease)   . Palpitations     Past Surgical History  Procedure Laterality Date  . Bicep re-attachment    . Nasal sinus surgery      FAMHx:  Family History  Problem Relation Age of Onset  . Adopted: Yes  . Heart attack Paternal Grandfather     SOCHx:   reports that he has never smoked. He has never used smokeless tobacco. He reports that he does not drink alcohol or use illicit  drugs.  ALLERGIES:  No Known Allergies  ROS: A comprehensive review of systems was negative except for: Gastrointestinal: positive for dyspepsia and reflux symptoms  HOME MEDS: Current Outpatient Prescriptions  Medication Sig Dispense Refill  . aspirin 81 MG tablet Take 81 mg by mouth daily.      . CRESTOR 5 MG tablet Take 1 tablet by mouth Daily.    Marland Kitchen dexlansoprazole (DEXILANT) 60 MG capsule Take 60 mg by mouth daily.    . Multiple Vitamin (MULTIVITAMIN)  capsule Take 1 capsule by mouth daily.    . Omega-3 Fatty Acids (FISH OIL) 1200 MG CAPS Take 1 capsule by mouth daily.    . sildenafil (REVATIO) 20 MG tablet Take 2-3 tablets (40-60 mg total) by mouth daily as needed. 50 tablet 1  . valACYclovir (VALTREX) 500 MG tablet Take 1 tablet by mouth 2 (two) times daily.     No current facility-administered medications for this visit.    LABS/IMAGING: No results found for this or any previous visit (from the past 48 hour(s)). No results found.  VITALS: BP 126/100 mmHg  Pulse 67  Ht 5\' 10"  (1.778 m)  Wt 197 lb 1.6 oz (89.404 kg)  BMI 28.28 kg/m2  EXAM: General appearance: alert and no distress Lungs: clear to auscultation bilaterally Heart: regular rate and rhythm, S1, S2 normal, no murmur, click, rub or gallop Extremities: extremities normal, atraumatic, no cyanosis or edema  EKG: Normal sinus rhythm at 67, no ischemia   ASSESSMENT: 1. Atypical left upper chest/shoulder pain - low coronary calcium score 46 2. GERD 3. Dyslipidemia 4. Erectile dysfunction  PLAN: 1.   Mr. Feil had an episode of left and right sided chest pain with radiation to both arms, this was not associated with exertion and seem to improve with Zantac. He subsequently started taking Dexliant and his symptoms have improved but not yet totally resolved. He's only been on the medicine for a week. I've encouraged him to continue to take this as I feel that this is likely recurrent reflux. He could also take Zantac in addition to this. He should contact Dr. Bosie Clos to be reevaluated. If his symptoms persist or are associated with exertion or he noticed worsening exercise tolerance, I'm happy to see him back and we may consider invasive cardiac evaluation however I think this is very atypical for coronary disease, despite his coronary calcium.   Chrystie Nose, MD, [facility] Attending Cardiologist [facility]  Lisette Abu Hilty 05/25/2015, 1:26 PM

## 2015-05-28 ENCOUNTER — Ambulatory Visit: Payer: Self-pay | Admitting: Physician Assistant

## 2015-08-04 ENCOUNTER — Telehealth: Payer: Self-pay | Admitting: Internal Medicine

## 2015-08-04 MED ORDER — ROSUVASTATIN CALCIUM 10 MG PO TABS: 10.0000 mg | ORAL_TABLET | Freq: Every day | ORAL | Status: DC

## 2015-08-04 NOTE — Telephone Encounter (Signed)
Reviewed labs- cholesterol is higher. I would recommend increasing Crestor to 10 mg daily as we had discussed before.  Dr. HRexene Edison

## 2015-08-04 NOTE — Telephone Encounter (Signed)
Spoke with pt, aware no labs received at this time. Spoke with dr Alver Fishershaw's office, requested labs be faxed to our office.

## 2015-08-04 NOTE — Telephone Encounter (Signed)
Patient called and notified of MD advice. He voiced understanding.  Rx(s) sent to pharmacy electronically. Co-pay card left for patient to pick up.

## 2015-08-04 NOTE — Telephone Encounter (Signed)
Labs received and on MD cart for review

## 2015-08-04 NOTE — Addendum Note (Signed)
Addended by: Lindell SparELKINS, JENNA M on: 08/04/2015 05:48 PM   Modules accepted: Orders

## 2015-08-04 NOTE — Telephone Encounter (Signed)
Pt had lab work on 06-25-15 at Dr Alver FisherShaw's office. He wants to know if you received it,and what was his Cholesterol results?

## 2015-08-18 ENCOUNTER — Encounter: Payer: Self-pay | Admitting: Internal Medicine

## 2016-02-03 ENCOUNTER — Ambulatory Visit (INDEPENDENT_AMBULATORY_CARE_PROVIDER_SITE_OTHER): Payer: Managed Care, Other (non HMO) | Admitting: Internal Medicine

## 2016-02-03 ENCOUNTER — Encounter: Payer: Self-pay | Admitting: Internal Medicine

## 2016-02-03 VITALS — BP 126/96 | HR 82 | Ht 70.0 in | Wt 207.4 lb

## 2016-02-03 DIAGNOSIS — I77810 Thoracic aortic ectasia: Secondary | ICD-10-CM

## 2016-02-03 DIAGNOSIS — E785 Hyperlipidemia, unspecified: Secondary | ICD-10-CM

## 2016-02-03 DIAGNOSIS — R938 Abnormal findings on diagnostic imaging of other specified body structures: Secondary | ICD-10-CM | POA: Diagnosis not present

## 2016-02-03 DIAGNOSIS — R918 Other nonspecific abnormal finding of lung field: Secondary | ICD-10-CM | POA: Diagnosis not present

## 2016-02-03 DIAGNOSIS — R931 Abnormal findings on diagnostic imaging of heart and coronary circulation: Secondary | ICD-10-CM

## 2016-02-03 NOTE — Progress Notes (Signed)
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 He is also on chronic suppressive therapy for HSV. He was praised evaluated for atypical chest pain and palpitations in 2012. He underwent an exercise echocardiogram which was negative for ischemia. He also wore monitor for. At time. Symptoms improved and were related to stress. He does not know his medical history secondary to being adopted. Recently he's had more discomfort mostly in his left shoulder and some in the left upper chest but no evidence for substernal chest pain. He's also been having worsening symptoms which she attributes to reflux. He was switched from Nexium to Dexilant. In addition, he notes that when he takes Zantac he has marked improvement in his symptoms. 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  The pleasure see [name] back in the office today. Overall he is doing well denies any further chest pain or shortness of breath. In fact she's managed to lose about 20 pounds since his last office visit. He says he's on the [employer] diet. This is a combination of caloric restriction, hCG, other vitamins and appetite suppressants. He also continues to exercise. He had a lipid profile performed which showed total cholesterol 140, glyceride 66, HL 43, LDL 84. He's currently on Crestor 5 mg daily. The CT scan of his coronary Arteries was performed which demonstrated a low coronary artery calcium score of 46, but was located in the proximal to mid LAD. The aortic root was mildly dilated 4.0 cm. [facility] indicated several small subcentimeter pulmonary nodules, the largest being 5 mm. A  repeat CT scan was recommended in one year. He is not a smoker and at lower risk for bronchogenic carcinoma. In addition, today he reported some recent erectile dysfunction, which was unrelated to her follow-up. He is asking about Viagra.  [name] returns today for follow-up. Over the weekend he had an episode where he developed pain on the left and right sides of his chest laterally that radiated down his arms. There is no central chest discomfort. It seemed to occurred fairly recently after eating. He did take a Zantac and went to the emergency room. He said he waited outside of the emergency room and his symptoms resolved and he ultimately was never seen. He then started taking his Dexilant again. He has noted some improvement in his symptoms but is not completely resolved. He says he recently saw his gastroenterologist but was not having any symptoms at the time. He continues to exercise at a significant rate without any chest pain or shortness of breath.  [name] returns today for follow-up. He reports some occasional left shoulder pain but is able to exercise at a high rate without any chest pain or worsening shortness of breath. He exercises fairly regularly. He recently thought he was having side effects from his Crestor discontinued that however the symptoms did not go away and he restarted his medicine. He is due for repeat lipid profile. He is also due for one year follow-up of his last chest CT which demonstrated several small sub-pulmonary nodules. There was also a mildly dilated aortic root to 4.0 cm.  PMHx:  Past Medical History  Diagnosis Date  . Hyperlipidemia   . GERD (gastroesophageal reflux disease)   . Palpitations     Past Surgical History  Procedure Laterality Date  . Bicep re-attachment    . Nasal sinus surgery      FAMHx:  Family History  Problem Relation Age of Onset  . Adopted: Yes  . Heart attack Paternal Grandfather     SOCHx:   reports that he has  never smoked. He has never used smokeless tobacco. He reports that he does not drink alcohol or use illicit drugs.  ALLERGIES:  No Known Allergies  ROS: A comprehensive review of systems was negative except for: Gastrointestinal: positive for dyspepsia and reflux symptoms  HOME MEDS: Current Outpatient Prescriptions  Medication Sig Dispense Refill  . aspirin 81 MG tablet Take 81 mg by mouth daily.      . Multiple Vitamin (MULTIVITAMIN) capsule Take 1 capsule by mouth daily.    . Omega-3 Fatty Acids (FISH OIL) 1200 MG CAPS Take 1 capsule by mouth daily.    Marland Kitchen omeprazole (PRILOSEC) 40 MG capsule Take 40 mg by mouth daily as needed.    . rosuvastatin (CRESTOR) 10 MG tablet Take 1 tablet (10 mg total) by mouth daily. 30 tablet 11  . sildenafil (REVATIO) 20 MG tablet Take 2-3 tablets (40-60 mg total) by mouth daily as needed. 50 tablet 1  . valACYclovir (VALTREX) 500 MG tablet Take 1 tablet by mouth 2 (two) times daily.     No current facility-administered medications for this visit.    LABS/IMAGING: No results found for this or any previous visit (from the past 48 hour(s)). No results found.  VITALS: BP 126/96 mmHg  Pulse 82  Ht  (1.778 m)  Wt 207 lb 6.4 oz (94.076 kg)  BMI 29.76 kg/m2  EXAM: General appearance: alert and no distress Lungs: clear to auscultation bilaterally Heart: regular rate and rhythm, S1, S2 normal, no murmur, click, rub or gallop Extremities: extremities normal, atraumatic, no cyanosis or edema  EKG: Normal sinus rhythm at 82  ASSESSMENT: 1. Atypical left upper chest/shoulder pain - low coronary calcium score 46 2. GERD 3. Dyslipidemia 4. Erectile dysfunction 5. Mildly dilated aortic root to 4.0 cm 6. Subcentimeter pulmonary nodules  PLAN: 1.   Mr. Asato is generally asymptomatic. He continues to exercise without problems. He has on-and-off symptoms of reflux and he is back on Prilosec. He recently stopped and restarted his Crestor. He is  due for recheck of his lipid profile which were ordered today. He also is due for repeat CT scan of the chest to reassess his aortic root size as well as a subcentimeter pulmonary nodules to see if they've grown in size. Plan follow-up annually or sooner as necessary to all contacted with the results of the CT and lipid profile.  Chrystie Nose, MD, [facility] Attending Cardiologist [facility]  Lisette Abu Hilty 02/03/2016, 1:00 PM

## 2016-02-03 NOTE — Patient Instructions (Signed)
Your physician recommends that you return for lab work FASTING to check cholesterol   Non-Cardiac CT scanning, (CAT scanning), is a noninvasive, special x-ray that produces cross-sectional images of the body using x-rays and a computer. CT scans help physicians diagnose and treat medical conditions. For some CT exams, a contrast material is used to enhance visibility in the area of the body being studied. CT scans provide greater clarity and reveal more details than regular x-ray exams.  Your physician wants you to follow-up in: 1 year with Dr. Rennis GoldenHilty. You will receive a reminder letter in the mail two months in advance. If you don't receive a letter, please call our office to schedule the follow-up appointment.

## 2016-02-08 ENCOUNTER — Ambulatory Visit (INDEPENDENT_AMBULATORY_CARE_PROVIDER_SITE_OTHER)
Admission: RE | Admit: 2016-02-08 | Discharge: 2016-02-08 | Disposition: A | Discharge status: Home / Self Care | Payer: Managed Care, Other (non HMO) | Source: Ambulatory Visit | Attending: Internal Medicine | Admitting: Internal Medicine

## 2016-02-08 DIAGNOSIS — I77819 Aortic ectasia, unspecified site: Secondary | ICD-10-CM | POA: Diagnosis not present

## 2016-02-08 DIAGNOSIS — R918 Other nonspecific abnormal finding of lung field: Secondary | ICD-10-CM

## 2016-02-09 LAB — LIPID PANEL
Cholesterol: 126 mg/dL (ref 125–200)
HDL: 42 mg/dL (ref >=40)
LDL CALC: 70 mg/dL (ref <130)
TRIGLYCERIDES: 71 mg/dL (ref <150)
Total CHOL/HDL Ratio: 3 Ratio (ref <=5.0)
VLDL: 14 mg/dL (ref <30)

## 2016-02-10 ENCOUNTER — Telehealth: Payer: Self-pay | Admitting: Internal Medicine

## 2016-02-10 NOTE — Telephone Encounter (Signed)
Returned called to patient. Notified of lab & CT results.

## 2016-02-10 NOTE — Telephone Encounter (Signed)
New message    Pt is calling requesting to speak to Dr.hilty rn about test results for lab

## 2016-02-14 ENCOUNTER — Ambulatory Visit: Payer: Self-pay | Admitting: Internal Medicine

## 2016-02-21 ENCOUNTER — Telehealth: Payer: Self-pay | Admitting: Internal Medicine

## 2016-02-21 NOTE — Telephone Encounter (Signed)
Pt aware Dr. Rennis GoldenHilty has reviewed his results and does not feel there are any changes from previous studies but wait on radiology to review and give his opinion. Pt stated he is appreciative and would still like call back once radiology has reviewed.

## 2016-02-21 NOTE — Telephone Encounter (Signed)
Pt told that request was sent back to radiology by Dr. Rennis GoldenHilty to review his Aorta. Pt is very much awaiting results. Told I would sent this message to MD to see if there are any update. Pt very appreciative.

## 2016-02-21 NOTE — Telephone Encounter (Signed)
New message    Per pt returning the called about the CT results for aorta portion of the test. The pt verbalized he received everything else but the aorta portion of the test. Please call back to discuss.    Message sent by csy

## 2016-02-21 NOTE — Telephone Encounter (Signed)
I sent a message to the radiologist to amend the report, but he was on vacation last week. I personally reviewed the images and the aorta does not measure larger than last year (about 3.9-4 cm). I will notify him of the official report when it is received.  Dr. HRexene Edison

## 2016-02-22 NOTE — Telephone Encounter (Signed)
Patient called with CT results - addendum per radiologist showed stable aortic dilatation. Patient voiced understanding.

## 2016-02-25 ENCOUNTER — Other Ambulatory Visit: Payer: Self-pay | Admitting: *Deleted

## 2016-02-25 DIAGNOSIS — R911 Solitary pulmonary nodule: Secondary | ICD-10-CM

## 2016-02-25 DIAGNOSIS — I77819 Aortic ectasia, unspecified site: Secondary | ICD-10-CM

## 2016-04-05 ENCOUNTER — Ambulatory Visit (INDEPENDENT_AMBULATORY_CARE_PROVIDER_SITE_OTHER): Payer: 59 | Admitting: Psychology

## 2016-04-05 DIAGNOSIS — F332 Major depressive disorder, recurrent severe without psychotic features: Secondary | ICD-10-CM

## 2016-04-14 ENCOUNTER — Ambulatory Visit (INDEPENDENT_AMBULATORY_CARE_PROVIDER_SITE_OTHER): Payer: 59 | Admitting: Psychology

## 2016-04-14 DIAGNOSIS — F332 Major depressive disorder, recurrent severe without psychotic features: Secondary | ICD-10-CM

## 2016-04-28 ENCOUNTER — Ambulatory Visit (INDEPENDENT_AMBULATORY_CARE_PROVIDER_SITE_OTHER): Payer: 59 | Admitting: Psychology

## 2016-04-28 DIAGNOSIS — F331 Major depressive disorder, recurrent, moderate: Secondary | ICD-10-CM | POA: Diagnosis not present

## 2016-05-03 ENCOUNTER — Ambulatory Visit (INDEPENDENT_AMBULATORY_CARE_PROVIDER_SITE_OTHER): Payer: 59 | Admitting: Psychology

## 2016-05-03 DIAGNOSIS — F331 Major depressive disorder, recurrent, moderate: Secondary | ICD-10-CM

## 2016-05-05 ENCOUNTER — Ambulatory Visit: Payer: 59 | Admitting: Psychology

## 2016-05-11 ENCOUNTER — Ambulatory Visit (INDEPENDENT_AMBULATORY_CARE_PROVIDER_SITE_OTHER): Payer: 59 | Admitting: Psychology

## 2016-05-11 DIAGNOSIS — F4320 Adjustment disorder, unspecified: Secondary | ICD-10-CM | POA: Diagnosis not present

## 2016-05-18 ENCOUNTER — Ambulatory Visit (INDEPENDENT_AMBULATORY_CARE_PROVIDER_SITE_OTHER): Payer: 59 | Admitting: Psychology

## 2016-05-18 DIAGNOSIS — F4320 Adjustment disorder, unspecified: Secondary | ICD-10-CM | POA: Diagnosis not present

## 2016-05-26 ENCOUNTER — Ambulatory Visit (INDEPENDENT_AMBULATORY_CARE_PROVIDER_SITE_OTHER): Payer: 59 | Admitting: Psychology

## 2016-05-26 DIAGNOSIS — F4320 Adjustment disorder, unspecified: Secondary | ICD-10-CM | POA: Diagnosis not present

## 2016-06-01 ENCOUNTER — Ambulatory Visit (INDEPENDENT_AMBULATORY_CARE_PROVIDER_SITE_OTHER): Payer: 59 | Admitting: Psychology

## 2016-06-01 DIAGNOSIS — F4320 Adjustment disorder, unspecified: Secondary | ICD-10-CM

## 2016-06-08 ENCOUNTER — Ambulatory Visit (INDEPENDENT_AMBULATORY_CARE_PROVIDER_SITE_OTHER): Payer: 59 | Admitting: Psychology

## 2016-06-08 DIAGNOSIS — F4320 Adjustment disorder, unspecified: Secondary | ICD-10-CM

## 2016-06-15 ENCOUNTER — Ambulatory Visit (INDEPENDENT_AMBULATORY_CARE_PROVIDER_SITE_OTHER): Payer: 59 | Admitting: Psychology

## 2016-06-15 DIAGNOSIS — F331 Major depressive disorder, recurrent, moderate: Secondary | ICD-10-CM | POA: Diagnosis not present

## 2016-06-21 ENCOUNTER — Ambulatory Visit (INDEPENDENT_AMBULATORY_CARE_PROVIDER_SITE_OTHER): Payer: 59 | Admitting: Psychology

## 2016-06-21 DIAGNOSIS — F332 Major depressive disorder, recurrent severe without psychotic features: Secondary | ICD-10-CM | POA: Diagnosis not present

## 2016-06-29 ENCOUNTER — Ambulatory Visit (INDEPENDENT_AMBULATORY_CARE_PROVIDER_SITE_OTHER): Payer: 59 | Admitting: Psychology

## 2016-06-29 DIAGNOSIS — F4320 Adjustment disorder, unspecified: Secondary | ICD-10-CM | POA: Diagnosis not present

## 2016-07-13 ENCOUNTER — Ambulatory Visit (INDEPENDENT_AMBULATORY_CARE_PROVIDER_SITE_OTHER): Payer: 59 | Admitting: Psychology

## 2016-07-13 DIAGNOSIS — F332 Major depressive disorder, recurrent severe without psychotic features: Secondary | ICD-10-CM | POA: Diagnosis not present

## 2016-07-21 ENCOUNTER — Ambulatory Visit (INDEPENDENT_AMBULATORY_CARE_PROVIDER_SITE_OTHER): Payer: 59 | Admitting: Psychology

## 2016-07-21 DIAGNOSIS — F332 Major depressive disorder, recurrent severe without psychotic features: Secondary | ICD-10-CM | POA: Diagnosis not present

## 2016-07-28 ENCOUNTER — Ambulatory Visit (INDEPENDENT_AMBULATORY_CARE_PROVIDER_SITE_OTHER): Payer: 59 | Admitting: Psychology

## 2016-07-28 DIAGNOSIS — F332 Major depressive disorder, recurrent severe without psychotic features: Secondary | ICD-10-CM | POA: Diagnosis not present

## 2016-07-28 DIAGNOSIS — F4320 Adjustment disorder, unspecified: Secondary | ICD-10-CM

## 2016-08-05 ENCOUNTER — Other Ambulatory Visit: Payer: Self-pay | Admitting: Internal Medicine

## 2016-08-11 ENCOUNTER — Ambulatory Visit (INDEPENDENT_AMBULATORY_CARE_PROVIDER_SITE_OTHER): Payer: 59 | Admitting: Psychology

## 2016-08-11 ENCOUNTER — Telehealth (INDEPENDENT_AMBULATORY_CARE_PROVIDER_SITE_OTHER): Payer: Self-pay | Admitting: Physical Medicine and Rehabilitation

## 2016-08-11 DIAGNOSIS — F332 Major depressive disorder, recurrent severe without psychotic features: Secondary | ICD-10-CM

## 2016-08-14 NOTE — Telephone Encounter (Signed)
Needs prior auth from [place] [id number]

## 2016-08-14 NOTE — Telephone Encounter (Signed)
Yes ok 

## 2016-08-15 NOTE — Telephone Encounter (Signed)
Auto approve for d on [employer] website. Berkley HarveyAuth #[id number]. Good from 08/17/16-11/15/16 for cpt code [id number]

## 2016-08-21 ENCOUNTER — Encounter (INDEPENDENT_AMBULATORY_CARE_PROVIDER_SITE_OTHER): Payer: Self-pay | Admitting: Physical Medicine and Rehabilitation

## 2016-08-21 ENCOUNTER — Ambulatory Visit (INDEPENDENT_AMBULATORY_CARE_PROVIDER_SITE_OTHER): Payer: Managed Care, Other (non HMO) | Admitting: Physical Medicine and Rehabilitation

## 2016-08-21 VITALS — BP 132/87 | HR 80 | Temp 98.6°F

## 2016-08-21 DIAGNOSIS — M5416 Radiculopathy, lumbar region: Secondary | ICD-10-CM | POA: Diagnosis not present

## 2016-08-21 MED ORDER — LIDOCAINE HCL (PF) 1 % IJ SOLN
0.3300 mL | Freq: Once | INTRAMUSCULAR | Status: AC
  Administered 2016-08-21: 0.3 mL

## 2016-08-21 MED ORDER — METHYLPREDNISOLONE ACETATE 80 MG/ML IJ SUSP
80.0000 mg | Freq: Once | INTRAMUSCULAR | Status: AC
  Administered 2016-08-21: 80 mg

## 2016-08-21 NOTE — Progress Notes (Signed)
Jim Graham - 60 y.o. male MRN [id number]  Date of birth: 16-Dec-1955  Office Visit Note: Visit Date: 08/21/2016 PCP: Lupita Raider, MD Referred by: Lupita Raider, MD  Subjective: Chief Complaint  Patient presents with  . 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 He is retired now but has recently began increasing activity level with a cross fit or core fit type of exercises. Pt having increased low back pain x 2 weeks but really had had worsening over the last several months with acute flareup in the 2 week timeframe.  Had been doing running and "normal stuff" and it didn't bother him at all. Across back and worse on left side. States mostly having pain when getting up from seated position. He denies any frank radicular symptoms although he has constant tight left hamstring more than right. He does get some sensations into the heel is wellness been sort of a prolonged situation may last time I saw him was a year ago and we completed an S1 transforaminal injection with good relief. 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 He does have foraminal stenosis and lateral recess narrowing. He reports being able to exercise but not be able to do a lot right now. He's been using some lacrosse balls to do some manual work is also been using a foam roller. He's had no numbness tingling or paresthesias. He denies any bowel or bladder symptoms. He's been using anti-inflammatories without much relief at all. He states his symptoms are worsening. There are limiting what he can do right now.  Blood thinner-Aspirin 81 mg No dye allergy  Driver-Wife-Jennifer    Review of Systems  Constitutional: Negative for chills, fever, malaise/fatigue and weight loss.  HENT: Negative for hearing loss and sinus pain.   Eyes: Negative for blurred vision, double vision and  photophobia.  Respiratory: Negative for cough and shortness of breath.   Cardiovascular: Negative for chest pain, palpitations and leg swelling.  Gastrointestinal: Negative for abdominal pain, nausea and vomiting.  Genitourinary: Negative for flank pain, frequency and urgency.  Musculoskeletal: Negative for myalgias.  Skin: Negative for itching and rash.  Neurological: Negative for tremors, focal weakness and weakness.  Endo/Heme/Allergies: Negative.   Psychiatric/Behavioral: Negative for depression and suicidal ideas.  All other systems reviewed and are negative.  Otherwise per HPI.  Assessment & Plan: Visit Diagnoses:  1. Lumbar radiculopathy     Plan: Findings:  Chronic history of low back pain left more than right with degenerative disc height loss and discogenic edema at various stages due to disc herniations in the past. Now with several month history of worsening pain but 3 weeks of severe pain on the left side. He gets tight hamstring as well with some referral to the foot but not really radicular in terms of complaints. I still think is reasonable to complete an S1 transforaminal epidural injection visit done so well in the past. We've also discussed myofascial pain and trigger points. He should continue to work with the area around the quadratus lumborum and paraspinal muscles. Depending on the relief he gets with the injection we might look at trigger point injection. He should continue with foam roller and his current exercise plan with modification as needed.    Meds & Orders:  Meds ordered this encounter  Medications  . lidocaine (PF) (XYLOCAINE)  1 % injection 0.3 mL  . methylPREDNISolone acetate (DEPO-MEDROL) injection 80 mg    Orders Placed This Encounter  Procedures  . Epidural Steroid injection    Follow-up: Return if symptoms worsen or fail to improve after 2 weeks.   Procedures: No procedures performed  S1 Lumbosacral Transforaminal Epidural Steroid Injection -  Sub-Pedicular Approach with Fluoroscopic Guidance   Patient: Jim Graham      Date of Birth: 06-30-1956 MRN: [id number] PCP: Lupita RaiderSHAW,KIMBERLEE, MD      Visit Date: 08/21/2016   Universal Protocol:    Date/Time: 11/06/174:19 PM  Consent Given By: the patient  Position:  PRONE  Additional Comments: Vital signs were monitored before and after the procedure. Patient was prepped and draped in the usual sterile fashion. The correct patient, procedure, and site was verified.   Injection Procedure Details:  Procedure Site One Meds Administered:  Meds ordered this encounter  Medications  . lidocaine (PF) (XYLOCAINE) 1 % injection 0.3 mL  . methylPREDNISolone acetate (DEPO-MEDROL) injection 80 mg     Laterality: Left  Location/Site:  S1 Foramen   Needle size: 22 G  Needle type: Spinal  Needle Placement: Transforaminal  Findings:  -Contrast Used: 1 mL iohexol 180 mg iodine/mL   -Comments: Excellent flow of contrast along the nerve and into the epidural space.  Procedure Details: After squaring off the sacral end-plate to get a true AP view, the C-arm was positioned so that the best possible view of the S1 foramen was visualized. The soft tissues overlying this structure were infiltrated with 2-3 ml. of 1% Lidocaine without Epinephrine.    The spinal needle was inserted toward the target using a "trajectory" view along the fluoroscope beam.  Under AP and lateral visualization, the needle was advanced so it did not puncture dura. Biplanar projections were used to confirm position. Aspiration was confirmed to be negative for CSF and/or blood. A 1-2 ml. volume of Isovue-250 was injected and flow of contrast was noted at each level. Radiographs were obtained for documentation purposes.   After attaining the desired flow of contrast documented above, a 0.5 to 1.0 ml test dose of 0.25% Marcaine was injected into each respective transforaminal space.  The patient was observed for 90  seconds post injection.  After no sensory deficits were reported, and normal lower extremity motor function was noted,   the above injectate was administered so that equal amounts of the injectate were placed at each foramen (level) into the transforaminal epidural space.   Additional Comments:  The patient tolerated the procedure well But he did have somewhat of a spasm in the left hamstring. Dressing: Band-Aid    Post-procedure details: Patient was observed during the procedure. Post-procedure instructions were reviewed.  Patient left the clinic in stable condition.        Clinical History: No specialty comments available.  He reports that he has never smoked. He has never used smokeless tobacco. No results for input(s): HGBA1C, LABURIC in the last 8760 hours.  Objective:  VS:  HT:    WT:   BMI:     BP:132/87  HR:80bpm  TEMP:98.6 F (37 C)(Oral)  RESP:98 % Physical Exam  Constitutional: He appears well-developed and well-nourished. No distress.  Eyes: Conjunctivae are normal. Pupils are equal, round, and reactive to light.  Cardiovascular: Regular rhythm and intact distal pulses.   Pulmonary/Chest: Effort normal and breath sounds normal.  Musculoskeletal:  The patient ambulates without aid with a slightly forward flexed spine and the knee.  He has very tight hamstrings bilaterally with a equivocal slump test on the left. He has good distal strength without deficit. He has a focal trigger point in the quadratus lumborum paraspinal region on the left does reproduce some of his pain.  Skin: Skin is warm.  Psychiatric: He has a normal mood and affect.    Ortho Exam Imaging: No results found.  Past Medical/Family/Surgical/Social History: Medications & Allergies reviewed per EMR Patient Active Problem List   Diagnosis Date Noted  . GERD (gastroesophageal reflux disease) 05/25/2015  . Agatston coronary artery calcium score less than 100 02/08/2015  . ED (erectile  dysfunction) 02/08/2015  . Pulmonary nodules 02/08/2015  . Dilated aortic root (HCC) 02/08/2015  . Chest pain 08/24/2011  . HLD (hyperlipidemia) 08/24/2011  . Elevated BP 08/24/2011  . History of palpitations 12/16/2010   Past Medical History:  Diagnosis Date  . GERD (gastroesophageal reflux disease)   . Hyperlipidemia   . Palpitations    Family History  Problem Relation Age of Onset  . Adopted: Yes  . Heart attack Paternal Grandfather    Past Surgical History:  Procedure Laterality Date  . bicep re-attachment    . 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 Smoking status: Never Smoker  . Smokeless tobacco: Never Used  . Alcohol use No  . Drug use: No  . Sexual activity: Not on file

## 2016-08-21 NOTE — Procedures (Signed)
S1 Lumbosacral Transforaminal Epidural Steroid Injection - Sub-Pedicular Approach with Fluoroscopic Guidance   Patient: Jim Graham      Date of Birth: 30-Jan-1956 MRN: [id number] PCP: Jim Graham      Visit Date: 08/21/2016   Universal Protocol:    Date/Time: 11/06/174:19 PM  Consent Given By: the patient  Position:  PRONE  Additional Comments: Vital signs were monitored before and after the procedure. Patient was prepped and draped in the usual sterile fashion. The correct patient, procedure, and site was verified.   Injection Procedure Details:  Procedure Site One Meds Administered:  Meds ordered this encounter  Medications  . lidocaine (PF) (XYLOCAINE) 1 % injection 0.3 mL  . methylPREDNISolone acetate (DEPO-MEDROL) injection 80 mg     Laterality: Left  Location/Site:  S1 Foramen   Needle size: 22 G  Needle type: Spinal  Needle Placement: Transforaminal  Findings:  -Contrast Used: 1 mL iohexol 180 mg iodine/mL   -Comments: Excellent flow of contrast along the nerve and into the epidural space.  Procedure Details: After squaring off the sacral end-plate to get a true AP view, the C-arm was positioned so that the best possible view of the S1 foramen was visualized. The soft tissues overlying this structure were infiltrated with 2-3 ml. of 1% Lidocaine without Epinephrine.    The spinal needle was inserted toward the target using a "trajectory" view along the fluoroscope beam.  Under AP and lateral visualization, the needle was advanced so it did not puncture dura. Biplanar projections were used to confirm position. Aspiration was confirmed to be negative for CSF and/or blood. A 1-2 ml. volume of Isovue-250 was injected and flow of contrast was noted at each level. Radiographs were obtained for documentation purposes.   After attaining the desired flow of contrast documented above, a 0.5 to 1.0 ml test dose of 0.25% Marcaine was injected into each  respective transforaminal space.  The patient was observed for 90 seconds post injection.  After no sensory deficits were reported, and normal lower extremity motor function was noted,   the above injectate was administered so that equal amounts of the injectate were placed at each foramen (level) into the transforaminal epidural space.   Additional Comments:  The patient tolerated the procedure well But he did have somewhat of a spasm in the left hamstring. Dressing: Band-Aid    Post-procedure details: Patient was observed during the procedure. Post-procedure instructions were reviewed.  Patient left the clinic in stable condition.

## 2016-08-21 NOTE — Patient Instructions (Signed)

## 2016-08-25 ENCOUNTER — Ambulatory Visit (INDEPENDENT_AMBULATORY_CARE_PROVIDER_SITE_OTHER): Payer: 59 | Admitting: Psychology

## 2016-08-25 DIAGNOSIS — F332 Major depressive disorder, recurrent severe without psychotic features: Secondary | ICD-10-CM | POA: Diagnosis not present

## 2016-09-06 ENCOUNTER — Ambulatory Visit: Payer: 59 | Admitting: Psychology

## 2016-09-22 ENCOUNTER — Ambulatory Visit (INDEPENDENT_AMBULATORY_CARE_PROVIDER_SITE_OTHER): Payer: 59 | Admitting: Psychology

## 2016-09-22 DIAGNOSIS — F332 Major depressive disorder, recurrent severe without psychotic features: Secondary | ICD-10-CM | POA: Diagnosis not present

## 2016-10-13 ENCOUNTER — Ambulatory Visit (INDEPENDENT_AMBULATORY_CARE_PROVIDER_SITE_OTHER): Payer: 59 | Admitting: Psychology

## 2016-10-13 DIAGNOSIS — F332 Major depressive disorder, recurrent severe without psychotic features: Secondary | ICD-10-CM | POA: Diagnosis not present

## 2016-10-16 HISTORY — PX: OTHER SURGICAL HISTORY: SHX169

## 2016-11-01 ENCOUNTER — Ambulatory Visit (INDEPENDENT_AMBULATORY_CARE_PROVIDER_SITE_OTHER): Payer: Managed Care, Other (non HMO) | Admitting: Orthopaedic Surgery

## 2016-11-08 ENCOUNTER — Ambulatory Visit (INDEPENDENT_AMBULATORY_CARE_PROVIDER_SITE_OTHER): Payer: Managed Care, Other (non HMO) | Admitting: Orthopaedic Surgery

## 2016-11-10 ENCOUNTER — Ambulatory Visit (INDEPENDENT_AMBULATORY_CARE_PROVIDER_SITE_OTHER): Payer: 59 | Admitting: Psychology

## 2016-11-10 DIAGNOSIS — F332 Major depressive disorder, recurrent severe without psychotic features: Secondary | ICD-10-CM

## 2016-11-15 ENCOUNTER — Other Ambulatory Visit (INDEPENDENT_AMBULATORY_CARE_PROVIDER_SITE_OTHER): Payer: Self-pay

## 2016-11-15 ENCOUNTER — Ambulatory Visit (INDEPENDENT_AMBULATORY_CARE_PROVIDER_SITE_OTHER): Payer: Self-pay

## 2016-11-15 ENCOUNTER — Ambulatory Visit (INDEPENDENT_AMBULATORY_CARE_PROVIDER_SITE_OTHER): Payer: Managed Care, Other (non HMO) | Admitting: Orthopaedic Surgery

## 2016-11-15 DIAGNOSIS — M25512 Pain in left shoulder: Secondary | ICD-10-CM

## 2016-11-15 MED ORDER — METHYLPREDNISOLONE ACETATE 40 MG/ML IJ SUSP
40.0000 mg | INTRAMUSCULAR | Status: AC | PRN
  Administered 2016-11-15: 40 mg via INTRA_ARTICULAR

## 2016-11-15 MED ORDER — LIDOCAINE HCL 1 % IJ SOLN
3.0000 mL | INTRAMUSCULAR | Status: AC | PRN
  Administered 2016-11-15: 3 mL

## 2016-11-15 NOTE — Progress Notes (Signed)
Office Visit Note   Patient: Jim Graham           Date of Birth: 10/05/1956           MRN: [id number] Visit Date: 11/15/2016              Requested by: Lupita Raider, MD [address] [place], [place] [address] PCP: Lupita Raider, MD   Assessment & Plan: Visit Diagnoses:  1. Acute pain of left shoulder     Plan: Given the weakness the rotator cuff on exam of his left shoulder as well as is positive liftoff exam and MRI is warranted to rule out a rotator cuff tear. We placed a subacromial steroid injection in his shoulder just to calm down his pain and no continue his anti-inflammatories. I do feel that MRI is warranted based on the clinical exam findings. We'll see him back after the MRI.  Follow-Up Instructions: Return in about 2 weeks (around 11/29/2016).   Orders:  Orders Placed This Encounter  Procedures  . Large Joint Injection/Arthrocentesis  . XR Shoulder Left   No orders of the defined types were placed in this encounter.     Procedures: Large Joint Inj Date/Time: 11/15/2016 1:51 PM Performed by: Kathryne Hitch Authorized by: Kathryne Hitch   Location:  Shoulder Site:  L subacromial bursa Ultrasound Guidance: No   Fluoroscopic Guidance: No   Arthrogram: No   Medications:  3 mL lidocaine 1 %; 40 mg methylPREDNISolone acetate 40 MG/ML     Clinical Data: No additional findings.   Subjective: No chief complaint on file. The patient is well-known to me. He has had an acute injury to his left shoulder in October of this past year. He was lifting something heavy and felt a pop in his shoulder. He is tried to modify activities as well as take anti-inflammatories and alternate between ice and heat to get it feel better. He is also given adequate time. This is a nondominant side is definitely feeling weak at this point. The pain is waking him up at night.  HPI  Review of Systems He denies any chest pain, headache, short of  breath, fever, chills, nausea, vomiting. He denies any neck pain. He denies any radicular symptoms or numbness and tingling in his left upper extremity.  Objective: Vital Signs: There were no vitals taken for this visit.  Physical Exam He is alert and oriented 3 and in no acute distress. Ortho Exam Examination of his left shoulder shows weakness with abduction. There is weakness with external rotation. He has a positive liftoff exam as well. He does have positive Neer and Hawkins signs. Passively I get his range of motion full. There is no atrophy of the shoulder muscles on inspection. Specialty Comments:  No specialty comments available.  Imaging: Xr Shoulder Left  Result Date: 11/15/2016 3 views of his left shoulder show well located shoulder. There is loss of the subacromial outlet and some mild arthritic changes.    PMFS History: Patient Active Problem List   Diagnosis Date Noted  . GERD (gastroesophageal reflux disease) 05/25/2015  . Agatston coronary artery calcium score less than 100 02/08/2015  . ED (erectile dysfunction) 02/08/2015  . Pulmonary nodules 02/08/2015  . Dilated aortic root (HCC) 02/08/2015  . Chest pain 08/24/2011  . HLD (hyperlipidemia) 08/24/2011  . Elevated BP 08/24/2011  . History of palpitations 12/16/2010   Past Medical History:  Diagnosis Date  . GERD (gastroesophageal reflux disease)   .  Hyperlipidemia   . Palpitations     Family History  Problem Relation Age of Onset  . Adopted: Yes  . Heart attack Paternal Grandfather     Past Surgical History:  Procedure Laterality Date  . bicep re-attachment    . 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 Smoking status: Never Smoker  . Smokeless tobacco: Never Used  . Alcohol use No  . Drug use: No  . Sexual activity: Not on file

## 2016-11-22 ENCOUNTER — Ambulatory Visit
Admission: RE | Admit: 2016-11-22 | Discharge: 2016-11-22 | Disposition: A | Discharge status: Home / Self Care | Payer: Managed Care, Other (non HMO) | Source: Ambulatory Visit | Attending: Orthopaedic Surgery | Admitting: Orthopaedic Surgery

## 2016-11-22 DIAGNOSIS — M25512 Pain in left shoulder: Secondary | ICD-10-CM

## 2016-11-29 ENCOUNTER — Ambulatory Visit (INDEPENDENT_AMBULATORY_CARE_PROVIDER_SITE_OTHER): Payer: Managed Care, Other (non HMO) | Admitting: Orthopaedic Surgery

## 2016-11-29 DIAGNOSIS — M7542 Impingement syndrome of left shoulder: Secondary | ICD-10-CM

## 2016-11-29 DIAGNOSIS — S43432D Superior glenoid labrum lesion of left shoulder, subsequent encounter: Secondary | ICD-10-CM

## 2016-11-29 DIAGNOSIS — G8929 Other chronic pain: Secondary | ICD-10-CM

## 2016-11-29 DIAGNOSIS — M25512 Pain in left shoulder: Secondary | ICD-10-CM

## 2016-11-29 NOTE — Progress Notes (Addendum)
The patient is here for follow-up after an MRI of his left shoulder. He is a retired 61 year old is very physically fit. He does a Retail bankerlot of CrossFit and works with a Systems analystpersonal trainer. Been having a lot of problems with that shoulder and failed conservative treatment. We sent him for an MRI for further evaluation of the shoulder.  On examination he still hurts with external rotation and reaching behind and overhead. Shoulder doesn't feel weak itself but she can tell certainly bothering him. MRI does confirm I superior labral tear with significant degenerative changes the glenohumeral joint at that level with no joint effusion. There is a significant labral cyst. There is undersurface tearing of the supraspinatus portion of the rotator cuff but that is only minimal area and there is a lot of tendinosis of the supra his tendon as well. His subscapularis tendon and the remainder the rotator cuff and intact. The biceps tendon is intact.  At this point I would recommend an arthroscopic intervention given the SLAP tear as well as the labral cyst. I showed him a shoulder model and gave him copies of his MRI. We went over a shoulder model and explained how arthroscopic surgery would be helpful for what is dealing with. He does wish proceed with surgery given the amount of pain he setting in the shoulder. I explained the risks and benefits of the surgery to him in detail. Certainly if we can repair the labrum in the same setting we will. I explained what his postoperative recovery and therapy would be like as well. We'll work on getting the surgery set up and it would see him back at one week postoperative.  On clinical exam of that shoulder he has pain with overhead activities and reaching across him. He has a positive speed test and a positive O'Brien test. He also has a positive apprehension sign. All of these signs and symptoms correlate with his MRI findings.

## 2016-12-08 ENCOUNTER — Ambulatory Visit (INDEPENDENT_AMBULATORY_CARE_PROVIDER_SITE_OTHER): Payer: Managed Care, Other (non HMO) | Admitting: Psychology

## 2016-12-08 DIAGNOSIS — F332 Major depressive disorder, recurrent severe without psychotic features: Secondary | ICD-10-CM | POA: Diagnosis not present

## 2016-12-14 ENCOUNTER — Encounter: Payer: Self-pay | Admitting: Orthopaedic Surgery

## 2016-12-14 DIAGNOSIS — M7542 Impingement syndrome of left shoulder: Secondary | ICD-10-CM | POA: Diagnosis not present

## 2016-12-21 ENCOUNTER — Ambulatory Visit (INDEPENDENT_AMBULATORY_CARE_PROVIDER_SITE_OTHER): Payer: Self-pay | Admitting: Orthopaedic Surgery

## 2016-12-21 DIAGNOSIS — Z9889 Other specified postprocedural states: Secondary | ICD-10-CM | POA: Insufficient documentation

## 2016-12-21 NOTE — Progress Notes (Signed)
The patient is following up after left shoulder arthroscopy. 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 He had a pair labral cyst as well. We debrided all these back. He is doing well otherwise. On exam incisions look good and we removed of the sutures. He has some limited motion of the shoulder just to stiffness from the surgery.   At this point I will let him get back to most of his exercise routine however he'll avoid pushups and pull-ups as well as bench pressing for now. I showed him some shoulder exercises to try. We'll see him back in about 3 weeks to see how is doing overall.

## 2017-01-10 ENCOUNTER — Ambulatory Visit: Payer: 59 | Admitting: Psychology

## 2017-01-10 ENCOUNTER — Ambulatory Visit (INDEPENDENT_AMBULATORY_CARE_PROVIDER_SITE_OTHER): Payer: Self-pay | Admitting: Orthopaedic Surgery

## 2017-01-10 DIAGNOSIS — Z9889 Other specified postprocedural states: Secondary | ICD-10-CM

## 2017-01-10 NOTE — Progress Notes (Signed)
The patient is now about 4 weeks out from a left shoulder arthroscopy with subacromial decompression. We found significant wear of the posterior conoid and a posterior labral tear. He is working with his Systems analystpersonal trainer avoiding pushups and pull-ups and bench pressing and work on stretching his shoulder. This is certainly sore but his trainers work and things out for him.  On examination his range of motion is almost full except for his internal rotation with adduction his symptoms and limited. It certainly a Bollard him though with overhead activities and reaching behind but is motion is improving.  Atomic continue work with his trainer not sit or injecting his shoulder a month from now to just calm down the posterior surgery inflammation only if needed. I still want him to avoid pushups and pull-ups as well as bench pressing.

## 2017-02-07 ENCOUNTER — Ambulatory Visit (INDEPENDENT_AMBULATORY_CARE_PROVIDER_SITE_OTHER): Payer: Managed Care, Other (non HMO) | Admitting: Orthopaedic Surgery

## 2017-02-07 ENCOUNTER — Encounter (INDEPENDENT_AMBULATORY_CARE_PROVIDER_SITE_OTHER): Payer: Self-pay | Admitting: Orthopaedic Surgery

## 2017-02-07 DIAGNOSIS — M7542 Impingement syndrome of left shoulder: Secondary | ICD-10-CM

## 2017-02-07 DIAGNOSIS — Z9889 Other specified postprocedural states: Secondary | ICD-10-CM | POA: Diagnosis not present

## 2017-02-07 MED ORDER — METHOCARBAMOL 500 MG PO TABS: 500.0000 mg | ORAL_TABLET | Freq: Three times a day (TID) | ORAL | 0 refills | Status: DC | PRN

## 2017-02-07 MED ORDER — LIDOCAINE HCL 1 % IJ SOLN
3.0000 mL | INTRAMUSCULAR | Status: AC | PRN
  Administered 2017-02-07: 3 mL

## 2017-02-07 MED ORDER — METHYLPREDNISOLONE ACETATE 40 MG/ML IJ SUSP
40.0000 mg | INTRAMUSCULAR | Status: AC | PRN
  Administered 2017-02-07: 40 mg via INTRA_ARTICULAR

## 2017-02-07 MED ORDER — CELECOXIB 200 MG PO CAPS: 200.0000 mg | ORAL_CAPSULE | Freq: Two times a day (BID) | ORAL | 3 refills | Status: DC | PRN

## 2017-02-07 NOTE — Progress Notes (Signed)
Office Visit Note   Patient: Jim Graham           Date of Birth: [birth year]           MRN: [id number] Visit Date: 02/07/2017              Requested by: Lupita Raider, MD [address] [place], [place] [address] PCP: Lupita Raider, MD   Assessment & Plan: Visit Diagnoses:  1. History of arthroscopy of left shoulder     Plan: He tolerated the steroid injection in his left shoulder well. We'll try generic Celebrex which is been on the past and Robaxin. I'll see him back in a month to see how is doing overall.  Follow-Up Instructions: Return in about 4 weeks (around 03/07/2017).   Orders:  Orders Placed This Encounter  Procedures  . Large Joint Injection/Arthrocentesis   Meds ordered this encounter  Medications  . celecoxib (CELEBREX) 200 MG capsule    Sig: Take 1 capsule (200 mg total) by mouth 2 (two) times daily as needed.    Dispense:  60 capsule    Refill:  3  . methocarbamol (ROBAXIN) 500 MG tablet    Sig: Take 1 tablet (500 mg total) by mouth every 8 (eight) hours as needed for muscle spasms.    Dispense:  60 tablet    Refill:  0      Procedures: Large Joint Inj Date/Time: 02/07/2017 2:13 PM Performed by: Kathryne Hitch Authorized by: Kathryne Hitch   Location:  Shoulder Site:  L subacromial bursa Ultrasound Guidance: No   Fluoroscopic Guidance: No   Arthrogram: No   Medications:  3 mL lidocaine 1 %; 40 mg methylPREDNISolone acetate 40 MG/ML     Clinical Data: No additional findings.   Subjective: Chief Complaint  Patient presents with  . Left Shoulder - Pain, Follow-up   The patient is following up after a shoulder arthroscopy about 60 days ago. We found significant tearing in the posterior labrum and we debrided this. He is doing well overall. He would like to have steroid injection in his shoulder. He is regular patient of Dr. Alvester Morin is well gets injections in his back on occasion. HPI  Review of  Systems He denies fever, chills, nausea, vomiting.  Objective: Vital Signs: There were no vitals taken for this visit.  Physical Exam His alert and oriented 3 and in no acute distress Ortho Exam Examination of his left shoulder still shows some stiffness postoperatively and a little bit of catching at the subacromial area but overall he seems to be improving. Specialty Comments:  No specialty comments available.  Imaging: No results found.   PMFS History: Patient Active Problem List   Diagnosis Date Noted  . History of arthroscopy of left shoulder 12/21/2016  . GERD (gastroesophageal reflux disease) 05/25/2015  . Agatston coronary artery calcium score less than 100 02/08/2015  . ED (erectile dysfunction) 02/08/2015  . Pulmonary nodules 02/08/2015  . Dilated aortic root (HCC) 02/08/2015  . Chest pain 08/24/2011  . HLD (hyperlipidemia) 08/24/2011  . Elevated BP 08/24/2011  . History of palpitations 12/16/2010   Past Medical History:  Diagnosis Date  . GERD (gastroesophageal reflux disease)   . Hyperlipidemia   . Palpitations     Family History  Problem Relation Age of Onset  . Adopted: Yes  . Heart attack Paternal Grandfather     Past Surgical History:  Procedure Laterality Date  . bicep re-attachment    . 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 Smoking status: Never Smoker  . Smokeless tobacco: Never Used  . Alcohol use No  . Drug use: No  . Sexual activity: Not on file

## 2017-02-08 ENCOUNTER — Ambulatory Visit (INDEPENDENT_AMBULATORY_CARE_PROVIDER_SITE_OTHER): Payer: 59 | Admitting: Psychology

## 2017-02-08 DIAGNOSIS — F332 Major depressive disorder, recurrent severe without psychotic features: Secondary | ICD-10-CM

## 2017-03-07 ENCOUNTER — Ambulatory Visit (INDEPENDENT_AMBULATORY_CARE_PROVIDER_SITE_OTHER): Payer: Managed Care, Other (non HMO) | Admitting: Orthopaedic Surgery

## 2017-03-07 DIAGNOSIS — Z9889 Other specified postprocedural states: Secondary | ICD-10-CM

## 2017-03-07 NOTE — Progress Notes (Signed)
The patient is now about 3 months into a left shoulder arthroscopy. We had a debridement significant posterior labrum. He is doing better overall. I placed a subacromial injection of steroid his last visit and said that helped greatly and therapy is helped well.  His get excellent range of motion of his left shoulder at this point the pain is minimal.  Relation to full activities. He'll follow-up as needed. We can always provide another subacromial injection toward the end of summer he did. All questions were encouraged and answered.

## 2017-03-19 ENCOUNTER — Other Ambulatory Visit: Payer: Self-pay | Admitting: Internal Medicine

## 2017-03-19 NOTE — Telephone Encounter (Signed)
Rx request sent to pharmacy.  

## 2017-03-23 ENCOUNTER — Ambulatory Visit: Payer: 59 | Admitting: Psychology

## 2017-03-23 IMAGING — CT CT CHEST W/O CM
2 of 3 series · 14 of 36 positions shown, 17 images · non-contrast
Comparison: Chest CT 01/05/2015.

ADDENDUM:
Ascending thoracic aorta is ectatic measuring 4 cm in diameter
(unchanged compared to the prior study from 01/05/2015). Recommend
annual imaging followup by CTA or MRA. This recommendation follows
2646 ACCF/AHA/AATS/ACR/ASA/SCA/KALONNEN/FERIENHAUS/HALMIEV/CEOLA Guidelines for the
Diagnosis and Management of Patients with Thoracic Aortic Disease.
Circulation. 2646; 121: [id number].
CLINICAL DATA: 59-year-old male with history of pulmonary nodules.
Followup evaluation.

EXAM:
CT CHEST WITHOUT CONTRAST
TECHNIQUE: Multidetector CT imaging of the chest was performed following the
standard protocol without IV contrast.

[Series 2: thorax · axial · 0.89mm/px · z∈[-342,-60]mm · 11 of 167 slices shown, 14 images]
[im 13/167  mediastinal]
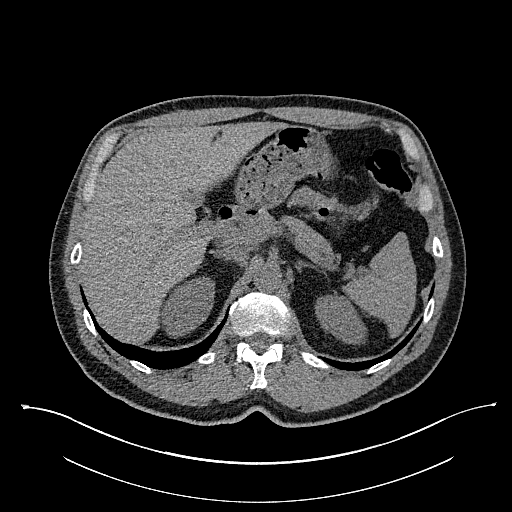
[im 13/167  lung]
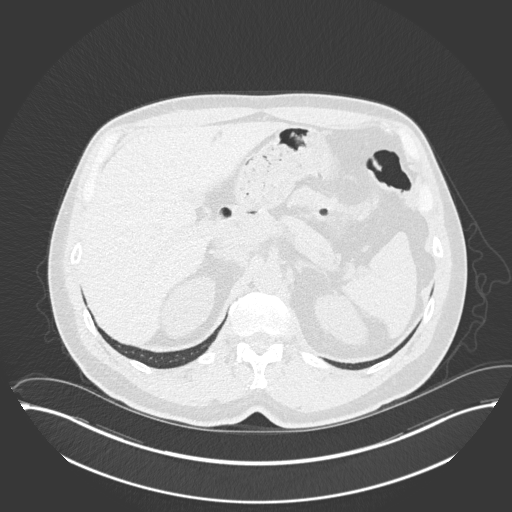
[im 25/167  lung]
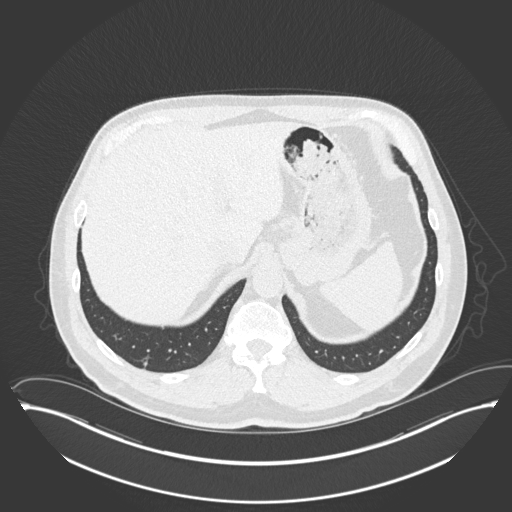
[im 37/167  lung]
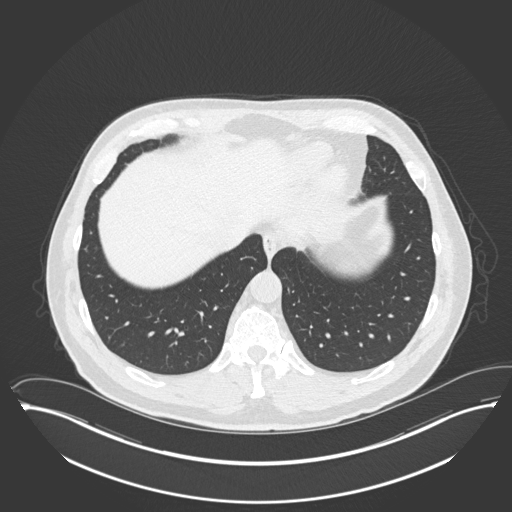
[im 56/167  lung]
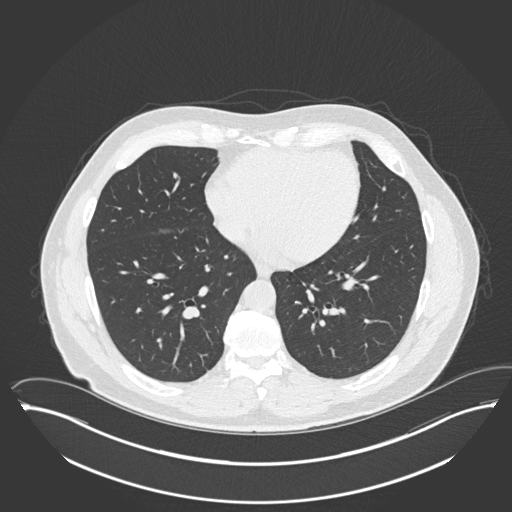
[im 68/167  mediastinal]
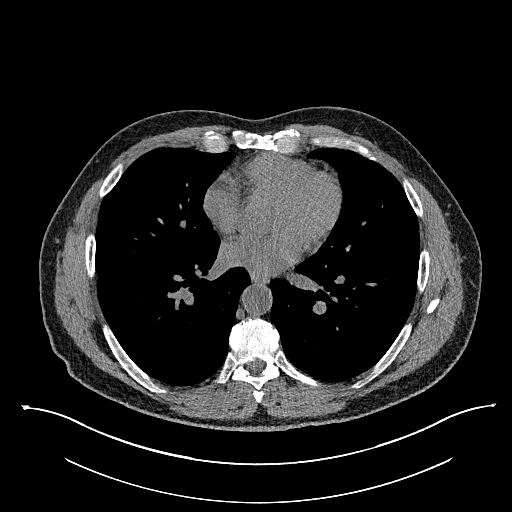
[im 68/167  lung]
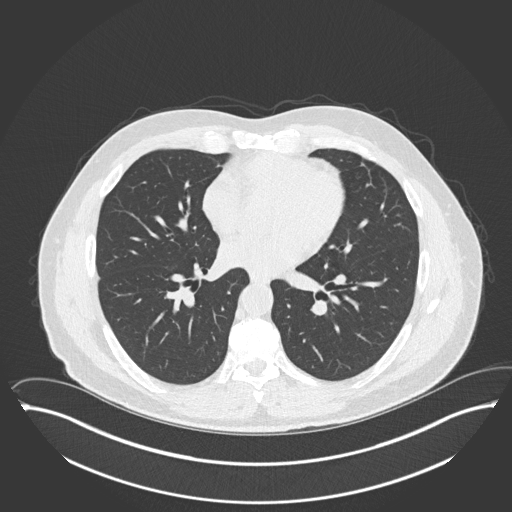
[im 87/167  lung]
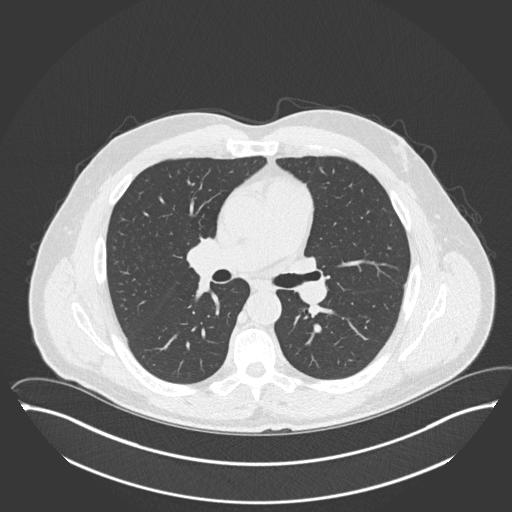
[im 99/167  lung]
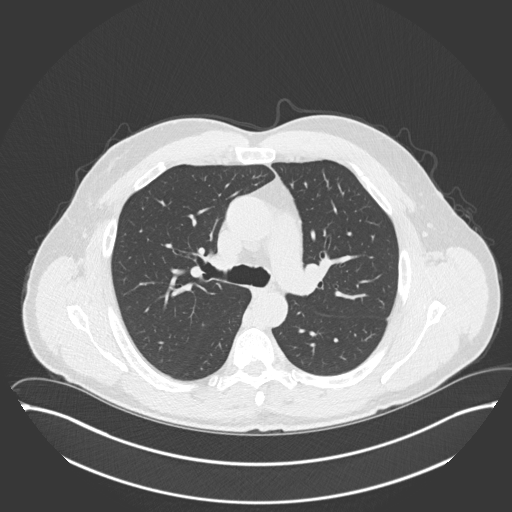
[im 111/167  lung]
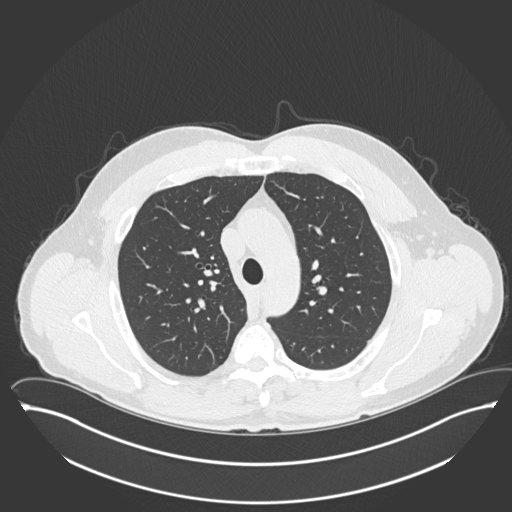
[im 130/167  mediastinal]
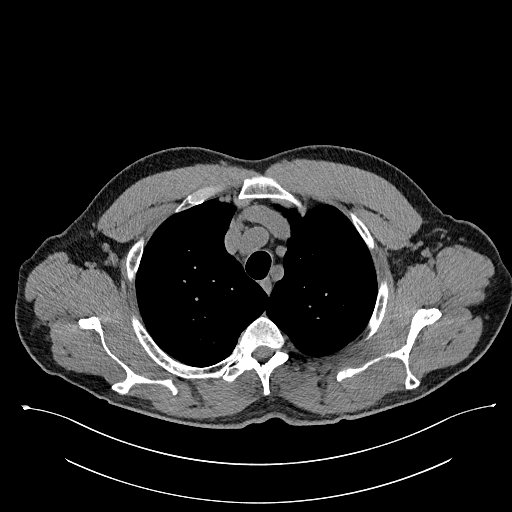
[im 130/167  lung]
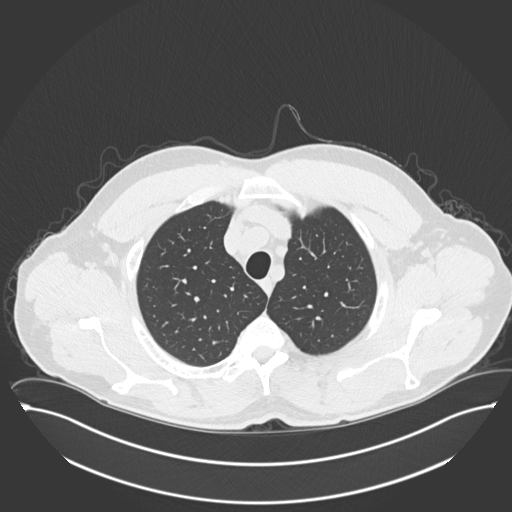
[im 142/167  lung]
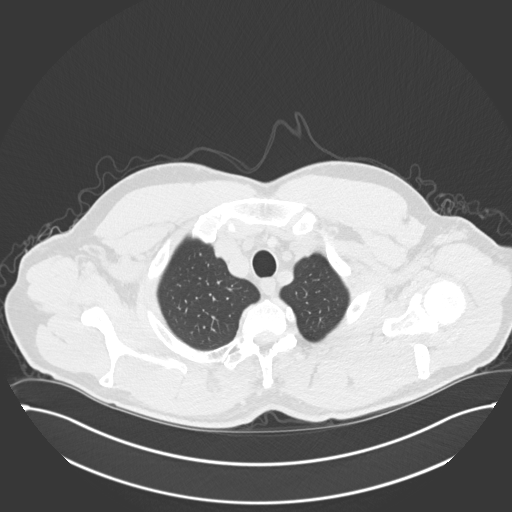
[im 154/167  lung]
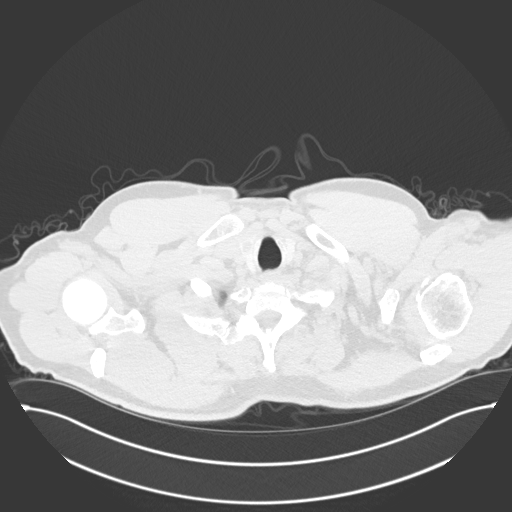

[Series 5: coronal · coronal · 0.65mm/px · 3 of 143 slices shown]
[im 29/143  lung]
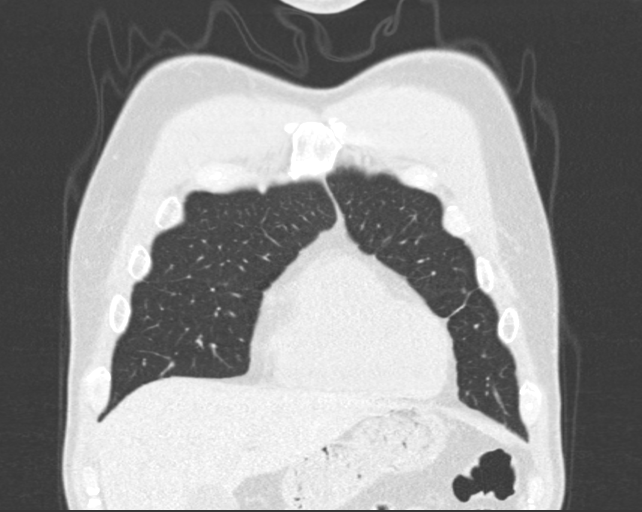
[im 57/143  lung]
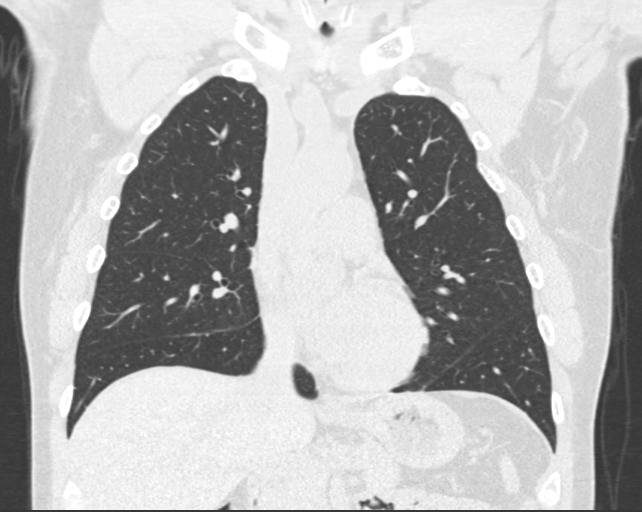
[im 86/143  lung]
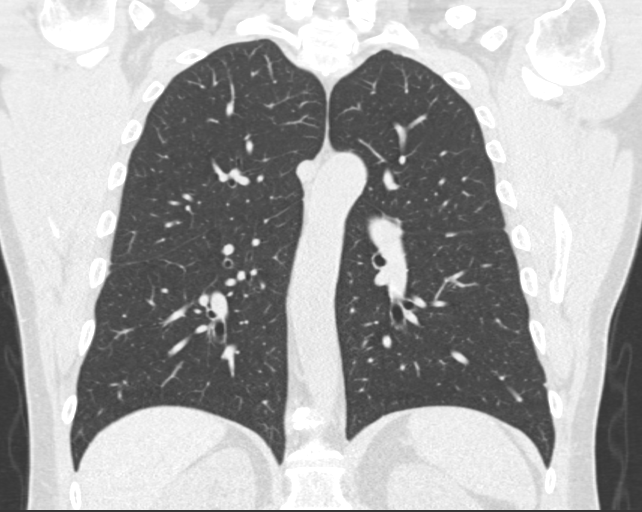

[14 of 36 positions shown; findings below may reference images not displayed]

FINDINGS: Mediastinum/Lymph Nodes: Heart size is normal. There is no
significant pericardial fluid, thickening or pericardial
calcification. There is atherosclerosis of the thoracic aorta, the
great vessels of the mediastinum and the coronary arteries,
including calcified atherosclerotic plaque in the left anterior
descending coronary arteries. No pathologically enlarged mediastinal
or hilar lymph nodes. Please note that accurate exclusion of hilar
adenopathy is limited on noncontrast CT scans. Esophagus is
unremarkable in appearance. No axillary lymphadenopathy.

Lungs/Pleura: Again noted are tiny pulmonary nodules throughout the
in lungs bilaterally which are unchanged in size, number and
distribution, all demonstrating a mean diameter of 4 mm or less in
size. No other suspicious appearing pulmonary nodules or masses are
noted. No acute consolidative airspace disease. No pleural
effusions.

Upper Abdomen: Unremarkable.

Musculoskeletal/Soft Tissues: There are no aggressive appearing
lytic or blastic lesions noted in the visualized portions of the
skeleton.
IMPRESSION: 1. Tiny pulmonary nodules scattered throughout the lungs bilaterally
are stable in size, number and distribution, all demonstrating a
mean diameter of 4 mm or less. These are statistically considered
benign and no future imaging followup is recommended at this time.
This recommendation follows the consensus statement: Guidelines for
Management of Incidental Pulmonary Nodules Detected on CT Images:
From the [HOSPITAL] 6895; published online before print
(10.1148/radiol.9817101083).
2. Atherosclerosis, including left anterior descending coronary
artery disease. Please note that although the presence of coronary
artery calcium documents the presence of coronary artery disease,
the severity of this disease and any potential stenosis cannot be
assessed on this non-gated CT examination. Assessment for potential
risk factor modification, dietary therapy or pharmacologic therapy
may be warranted, if clinically indicated.

## 2017-04-09 ENCOUNTER — Ambulatory Visit (INDEPENDENT_AMBULATORY_CARE_PROVIDER_SITE_OTHER)
Admission: RE | Admit: 2017-04-09 | Discharge: 2017-04-09 | Disposition: A | Discharge status: Home / Self Care | Payer: Managed Care, Other (non HMO) | Source: Ambulatory Visit | Attending: Internal Medicine | Admitting: Internal Medicine

## 2017-04-09 DIAGNOSIS — R911 Solitary pulmonary nodule: Secondary | ICD-10-CM

## 2017-04-09 DIAGNOSIS — I77819 Aortic ectasia, unspecified site: Secondary | ICD-10-CM

## 2017-04-23 ENCOUNTER — Ambulatory Visit (INDEPENDENT_AMBULATORY_CARE_PROVIDER_SITE_OTHER): Payer: Managed Care, Other (non HMO) | Admitting: Internal Medicine

## 2017-04-23 ENCOUNTER — Encounter: Payer: Self-pay | Admitting: Internal Medicine

## 2017-04-23 VITALS — BP 132/90 | HR 84 | Ht 70.0 in | Wt 218.0 lb

## 2017-04-23 DIAGNOSIS — I77810 Thoracic aortic ectasia: Secondary | ICD-10-CM | POA: Diagnosis not present

## 2017-04-23 DIAGNOSIS — R931 Abnormal findings on diagnostic imaging of heart and coronary circulation: Secondary | ICD-10-CM

## 2017-04-23 DIAGNOSIS — E785 Hyperlipidemia, unspecified: Secondary | ICD-10-CM

## 2017-04-23 MED ORDER — ROSUVASTATIN CALCIUM 20 MG PO TABS: 20.0000 mg | ORAL_TABLET | Freq: Every day | ORAL | 3 refills | Status: DC

## 2017-04-23 NOTE — Patient Instructions (Addendum)
Your physician has recommended you make the following change in your medication:  -- INCREASE crestor 20mg  once daily  Your physician wants you to follow-up in: ONE YEAR with Dr. Rennis GoldenHilty. You will receive a reminder letter in the mail two months in advance. If you don't receive a letter, please call our office to schedule the follow-up appointment.

## 2017-04-23 NOTE — Progress Notes (Signed)
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 He is also on chronic suppressive therapy for HSV. He was praised evaluated for atypical chest pain and palpitations in 2012. He underwent an exercise echocardiogram which was negative for ischemia. He also wore monitor for. At time. Symptoms improved and were related to stress. He does not know his medical history secondary to being adopted. Recently he's had more discomfort mostly in his left shoulder and some in the left upper chest but no evidence for substernal chest pain. He's also been having worsening symptoms which she attributes to reflux. He was switched from Nexium to Dexilant. In addition, he notes that when he takes Zantac he has marked improvement in his symptoms. 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  The pleasure see [name] back in the office today. Overall he is doing well denies any further chest pain or shortness of breath. In fact she's managed to lose about 20 pounds since his last office visit. He says he's on the [employer] diet. This is a combination of caloric restriction, hCG, other vitamins and appetite suppressants. He also continues to exercise. He had a lipid profile performed which showed total cholesterol 140, glyceride 66, HL 43, LDL 84. He's currently on Crestor 5 mg daily. The CT scan of his coronary Arteries was performed which demonstrated a low coronary artery calcium score of 46, but was located in the proximal to mid LAD. The aortic root was mildly dilated 4.0 cm. [facility] indicated several small subcentimeter pulmonary nodules, the largest being 5 mm. A repeat CT scan  was recommended in one year. He is not a smoker and at lower risk for bronchogenic carcinoma. In addition, today he reported some recent erectile dysfunction, which was unrelated to her follow-up. He is asking about Viagra.  [name] returns today for follow-up. Over the weekend he had an episode where he developed pain on the left and right sides of his chest laterally that radiated down his arms. There is no central chest discomfort. It seemed to occurred fairly recently after eating. He did take a Zantac and went to the emergency room. He said he waited outside of the emergency room and his symptoms resolved and he ultimately was never seen. He then started taking his Dexilant again. He has noted some improvement in his symptoms but is not completely resolved. He says he recently saw his gastroenterologist but was not having any symptoms at the time. He continues to exercise at a significant rate without any chest pain or shortness of breath.  [name] returns today for follow-up. He reports some occasional left shoulder pain but is able to exercise at a high rate without any chest pain or worsening shortness of breath. He exercises fairly regularly. He recently thought he was having side effects from his Crestor discontinued that however the symptoms did not go away and he restarted his medicine. He is due for repeat lipid profile. He is also due for one year follow-up of his last chest CT which demonstrated several small sub-pulmonary nodules. There was also a mildly dilated aortic root to 4.0 cm.  04/23/2017  Mr.  Jim Graham was seen today in follow-up. He underwent a recent repeat CT which showed no evidence of aortic dilatation and no change in his small subpulmonary nodules. No further CT was recommended. He denies any chest pain or worsening shortness of breath. He continues to exercise regularly. 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 We  recently received labs from his primary care provider which shows a total cholesterol of 149, temperature glyceride 88, HDL-C 40, LDL-C 91 and non-HDL-C of 469. I review the results today including his 10 year and 68 year MESA and pulled cohort risk assessments given his coronary artery calcium. He is at 6.5 and 7.5% risk respectively based on those databases and has a long-term risk of about 36% of developing a hard cardiovascular event. With this in mind his LDL-C is a goal less than 629, however his non-HDL-C is at goal <130. We discussed that based on current data if he were to be very aggressive about his medical therapy he may want to try to drive his LDL-C less than 70 or at least get his non-HDL-C less than 100. We may accomplish this with a small increase in his Crestor.  PMHx:  Past Medical History:  Diagnosis Date  . GERD (gastroesophageal reflux disease)   . Hyperlipidemia   . Palpitations     Past Surgical History:  Procedure Laterality Date  . bicep re-attachment    . NASAL SINUS SURGERY      FAMHx:  Family History  Problem Relation Age of Onset  . Adopted: Yes  . Heart attack Paternal Grandfather     SOCHx:   reports that he has never smoked. He has never used smokeless tobacco. He reports that he does not drink alcohol or use drugs.  ALLERGIES:  No Known Allergies  ROS: Pertinent items noted in HPI and remainder of comprehensive ROS otherwise negative.  HOME MEDS: Current Outpatient Prescriptions  Medication Sig Dispense Refill  . aspirin 81 MG tablet Take 81 mg by mouth daily.      Marland Kitchen buPROPion (WELLBUTRIN XL) 150 MG 24 hr tablet Take 150 mg by mouth daily.     . celecoxib (CELEBREX) 200 MG capsule Take 1 capsule (200 mg total) by mouth 2 (two) times daily as needed. 60 capsule 3  . methocarbamol (ROBAXIN) 500 MG tablet Take 1 tablet (500 mg total) by mouth every 8 (eight) hours as needed for muscle spasms. 60 tablet 0  . Multiple Vitamin (MULTIVITAMIN) capsule  Take 1 capsule by mouth daily.    . Omega-3 Fatty Acids (FISH OIL) 1200 MG CAPS Take 1 capsule by mouth daily.    . sildenafil (REVATIO) 20 MG tablet Take 2-3 tablets (40-60 mg total) by mouth daily as needed. 50 tablet 1  . valACYclovir (VALTREX) 500 MG tablet Take 1 tablet by mouth 2 (two) times daily.    . rosuvastatin (CRESTOR) 20 MG tablet Take 1 tablet (20 mg total) by mouth daily. 90 tablet 3   No current facility-administered medications for this visit.     LABS/IMAGING: No results found for this or any previous visit (from the past 48 hour(s)). No results found.  VITALS: BP 132/90   Pulse 84   Ht 5\' 10"  (1.778 m)   Wt 218 lb (98.9 kg)   BMI 31.28 kg/m   EXAM: General appearance: alert and no distress Lungs: clear to auscultation bilaterally Heart: regular rate and rhythm, S1, S2 normal, no murmur,  click, rub or gallop Extremities: extremities normal, atraumatic, no cyanosis or edema  EKG: Normal sinus rhythm at 79  ASSESSMENT: 1. Atypical left upper chest/shoulder pain - low coronary calcium score 46 2. GERD 3. Dyslipidemia 4. Erectile dysfunction 5. Mildly dilated aortic root to 4.0 cm 6. Subcentimeter pulmonary nodules - stable, do not require follow-up  PLAN: 1.   [name] continues exercise and is asymptomatic. As discussed above we are going to try to be a little more aggressive with his cholesterol. He is maximally modifying his lifestyle risk with exercise and diet at this point. Plan to increase his Crestor to 20 mg daily. He should watch for side effects including myalgias unrelated to his exercise. He says he while a repeat lipid profile with his full physical in September. I would appreciate a copy that from his primary care provider.  I'll put me annually or sooner as necessary.  Chrystie NoseKenneth C. Hilty, MD, [facility]ACC Attending Cardiologist [facility]  Lisette AbuKenneth C Hilty 04/23/2017, 1:06 PM

## 2017-05-15 ENCOUNTER — Telehealth: Payer: Self-pay | Admitting: Internal Medicine

## 2017-05-15 NOTE — Telephone Encounter (Signed)
Pt c/o medication issue:  1. Name of Medication: Crestor 20mg    2. How are you currently taking this medication (dosage and times per day)? Once a day in the morning   3. Are you having a reaction (difficulty breathing--STAT)? Yes( Itching all over )   4. What is your medication issue? Reaction

## 2017-05-15 NOTE — Telephone Encounter (Signed)
Sounds to me like he could try to take 2 x 10 mg Crestor tabs, after the current rash/itching resolves. I guess it is possible he is allergic to the different pigment or other additive in the 20 mg tablet. MCr

## 2017-05-15 NOTE — Telephone Encounter (Signed)
Spoke with pt he states that the itching started 04-24-17 and he has continued to take and then taking the benadryl but he thought that he would let us know and see what to do next. Informed pt to stop Crestor (brand)and continue to take benadryl until itching stops and call back if itching continues. Pt  states that Crestor 10mg  did not have this reaction can he start back on this since he just had his rx filled. Pt notified Dr Rennis GoldenHilty is on vacation and will defer direction to him when he returns, he states that this is fine. Please advise what to do next

## 2017-05-16 MED ORDER — CRESTOR 10 MG PO TABS: 20.0000 mg | ORAL_TABLET | Freq: Every day | ORAL | 3 refills | Status: DC

## 2017-05-16 NOTE — Telephone Encounter (Signed)
LM for patient with MD advice & that new Rx for brand name Crestor 10mg  x2 would be sent to pharmacy  Rx(s) sent to pharmacy electronically.

## 2017-05-29 ENCOUNTER — Telehealth: Payer: Self-pay | Admitting: Internal Medicine

## 2017-05-29 NOTE — Telephone Encounter (Signed)
Spoke with pt he states that he is having issues with his crestor again. He stopped for about 1 1/2 weeks and he states that itching stopped then he took crestor 10mg  QD and states that he was ok. Then he started taking 10mg  in the am and 10mg  at lunch and was having  intermittent itching that seems to only happen when he was in the sun. He will start taking 10mg  daily then every-other-day take 20mg  and see how this goes, is this ok, please advise

## 2017-05-29 NOTE — Telephone Encounter (Signed)
Jim Graham is calling because he is still having some issues with the Crestor .  Please call

## 2017-05-29 NOTE — Telephone Encounter (Signed)
That's fine ... He can take 10 mg QOD if that works for him.   Dr. HRexene Edison

## 2017-05-30 NOTE — Telephone Encounter (Signed)
Called patient and he states he is taking crestor 10mg  around 1pm and then 10mg  around dinner and he tolerates this OK. He states his only SE is itching, which is worse in the sun/if sweating. Advised to continue current med regimen and call if symptoms worsen.

## 2017-05-30 NOTE — Addendum Note (Signed)
Addended by: Lindell SparELKINS, Eppie Barhorst M on: 05/30/2017 11:31 AM   Modules accepted: Orders

## 2017-08-28 ENCOUNTER — Other Ambulatory Visit: Payer: Self-pay | Admitting: Internal Medicine

## 2017-08-28 MED ORDER — ROSUVASTATIN CALCIUM 10 MG PO TABS: ORAL_TABLET | ORAL | 11 refills | Status: DC

## 2017-08-28 NOTE — Telephone Encounter (Signed)
Rx(s) sent to pharmacy electronically for generic crestor d/t brand name not being covered

## 2018-06-07 ENCOUNTER — Encounter

## 2018-06-07 ENCOUNTER — Encounter: Payer: Self-pay | Admitting: Internal Medicine

## 2018-06-07 ENCOUNTER — Ambulatory Visit: Payer: BC Managed Care – PPO | Admitting: Internal Medicine

## 2018-06-07 VITALS — BP 122/88 | HR 84 | Ht 70.0 in | Wt 237.6 lb

## 2018-06-07 DIAGNOSIS — E785 Hyperlipidemia, unspecified: Secondary | ICD-10-CM

## 2018-06-07 DIAGNOSIS — R931 Abnormal findings on diagnostic imaging of heart and coronary circulation: Secondary | ICD-10-CM

## 2018-06-07 DIAGNOSIS — I77810 Thoracic aortic ectasia: Secondary | ICD-10-CM | POA: Diagnosis not present

## 2018-06-07 NOTE — Patient Instructions (Signed)
Your physician wants you to follow-up in: ONE YEAR with Dr. Hilty. You will receive a reminder letter in the mail two months in advance. If you don't receive a letter, please call our office to schedule the follow-up appointment.  

## 2018-06-07 NOTE — Progress Notes (Signed)
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 He is also on chronic suppressive therapy for HSV. He was praised evaluated for atypical chest pain and palpitations in 2012. He underwent an exercise echocardiogram which was negative for ischemia. He also wore monitor for. At time. Symptoms improved and were related to stress. He does not know his medical history secondary to being adopted. Recently he's had more discomfort mostly in his left shoulder and some in the left upper chest but no evidence for substernal chest pain. He's also been having worsening symptoms which she attributes to reflux. He was switched from Nexium to Dexilant. In addition, he notes that when he takes Zantac he has marked improvement in his symptoms. 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  The pleasure see Jim Graham back in the office today. Overall he is doing well denies any further chest pain or shortness of breath. In fact she's managed to lose about 20 pounds since his last office visit. He says he's on the [employer] diet. This is a combination of caloric restriction, hCG, other vitamins and appetite suppressants. He also continues to exercise. He had a lipid profile performed which showed total cholesterol 140, glyceride 66, HL 43, LDL 84. He's currently on Crestor 5 mg daily. The CT scan of his coronary Arteries was performed which demonstrated a low coronary artery calcium score of 46, but was located in the proximal to mid LAD. The aortic root was mildly dilated 4.0 cm. [facility] indicated several small subcentimeter pulmonary nodules, the largest being 5 mm. A repeat CT scan  was recommended in one year. He is not a smoker and at lower risk for bronchogenic carcinoma. In addition, today he reported some recent erectile dysfunction, which was unrelated to her follow-up. He is asking about Viagra.  Jim Graham returns today for follow-up. Over the weekend he had an episode where he developed pain on the left and right sides of his chest laterally that radiated down his arms. There is no central chest discomfort. It seemed to occurred fairly recently after eating. He did take a Zantac and went to the emergency room. He said he waited outside of the emergency room and his symptoms resolved and he ultimately was never seen. He then started taking his Dexilant again. He has noted some improvement in his symptoms but is not completely resolved. He says he recently saw his gastroenterologist but was not having any symptoms at the time. He continues to exercise at a significant rate without any chest pain or shortness of breath.  Jim Graham returns today for follow-up. He reports some occasional left shoulder pain but is able to exercise at a high rate without any chest pain or worsening shortness of breath. He exercises fairly regularly. He recently thought he was having side effects from his Crestor discontinued that however the symptoms did not go away and he restarted his medicine. He is due for repeat lipid profile. He is also due for one year follow-up of his last chest CT which demonstrated several small sub-pulmonary nodules. There was also a mildly dilated aortic root to 4.0 cm.  04/23/2017  Mr.  Graham was seen today in follow-up. He underwent a recent repeat CT which showed no evidence of aortic dilatation and no change in his small subpulmonary nodules. No further CT was recommended. He denies any chest pain or worsening shortness of breath. He continues to exercise regularly. 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 We  recently received labs from his primary care provider which shows a total cholesterol of 149, temperature glyceride 88, HDL-C 40, LDL-C 91 and non-HDL-C of 098. I review the results today including his 10 year and 79 year MESA and pulled cohort risk assessments given his coronary artery calcium. He is at 6.5 and 7.5% risk respectively based on those databases and has a long-term risk of about 36% of developing a hard cardiovascular event. With this in mind his LDL-C is a goal less than 119, however his non-HDL-C is at goal <130. We discussed that based on current data if he were to be very aggressive about his medical therapy he may want to try to drive his LDL-C less than 70 or at least get his non-HDL-C less than 100. We may accomplish this with a small increase in his Crestor.  06/07/2018  Jim Graham seen today in follow-up.  Overall seems to be doing well.  He denies any chest pain or shortness of breath.  He exercises regularly.  Recently gained some weight combination of both muscle mass and he feels like he may be more sedentary.  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  Recent labs in April 2019 showed total cholesterol 158, HDL 45, LDL 87 and triglycerides 132.  EKG sinus rhythm at 84.  PMHx:  Past Medical History:  Diagnosis Date  . GERD (gastroesophageal reflux disease)   . Hyperlipidemia   . Palpitations     Past Surgical History:  Procedure Laterality Date  . bicep re-attachment    . NASAL SINUS SURGERY      FAMHx:  Family History  Adopted: Yes  Problem Relation Age of Onset  . Heart attack Paternal Grandfather     SOCHx:   reports that he has never smoked. He has never used smokeless tobacco. He reports that he does not drink alcohol or use drugs.  ALLERGIES:  No Known Allergies  ROS: Pertinent items noted in HPI and remainder of comprehensive ROS otherwise negative.  HOME MEDS: Current Outpatient  Medications  Medication Sig Dispense Refill  . aspirin 81 MG tablet Take 81 mg by mouth daily.      . celecoxib (CELEBREX) 200 MG capsule Take 1 capsule (200 mg total) by mouth 2 (two) times daily as needed. 60 capsule 3  . Coenzyme Q10 100 MG capsule Take 100 mg by mouth daily.    . methocarbamol (ROBAXIN) 500 MG tablet Take 1 tablet (500 mg total) by mouth every 8 (eight) hours as needed for muscle spasms. 60 tablet 0  . Multiple Vitamin (MULTIVITAMIN) capsule Take 1 capsule by mouth daily.    . rosuvastatin (CRESTOR) 10 MG tablet Take 10mg  by mouth at 1pm and 10mg  at dinner. 60 tablet 11  . sildenafil (REVATIO) 20 MG tablet Take 2-3 tablets (40-60 mg total) by mouth daily as needed. 50 tablet 1  . valACYclovir (VALTREX) 500 MG tablet Take 1 tablet by mouth 2 (two) times daily.     No current facility-administered medications for this visit.  LABS/IMAGING: No results found for this or any previous visit (from the past 48 hour(s)). No results found.  VITALS: BP 122/88 (BP Location: Left Arm, Patient Position: Sitting, Cuff Size: Large)   Pulse 84   Ht 5\' 10"  (1.778 m)   Wt 237 lb 9.6 oz (107.8 kg)   BMI 34.09 kg/m   EXAM: General appearance: alert and no distress Neck: no carotid bruit, no JVD and thyroid not enlarged, symmetric, no tenderness/mass/nodules Lungs: clear to auscultation bilaterally Heart: regular rate and rhythm, S1, S2 normal, no murmur, click, rub or gallop Abdomen: soft, non-tender; bowel sounds normal; no masses,  no organomegaly Extremities: extremities normal, atraumatic, no cyanosis or edema Pulses: 2+ and symmetric Skin: Skin color, texture, turgor normal. No rashes or lesions Neurologic: Grossly normal Psych: Pleasant  EKG: Normal sinus rhythm 84-personally reviewed  ASSESSMENT: 1. Atypical left upper chest/shoulder pain - low coronary calcium score 46 2. GERD 3. Dyslipidemia 4. Erectile dysfunction 5. Mildly dilated aortic root to 4.0  cm 6. Subcentimeter pulmonary nodules - stable, do not require follow-up  PLAN: 1.   Jim Graham continues to do well.  He is on aggressive medical therapy.  Currently on rosuvastatin 20 mg daily with LDL in the low 80s.  He says he has had subsequent testing from his PCP which shows his LDL is even lower.  He is on testosterone replacement therapy.  He had a dilated aorta on 2017 at 4.0 cm however CT chest last year did not comment on dilated aorta.  Will need to consider repeat imaging next year to resolve this question.  Follow-up with me annually or sooner as necessary.  Chrystie Nose, MD, [facility], FACP  [place]  [facility]  Medical Director of the [facility]  [facility] Diplomate of the American Board of Clinical Lipidology Attending Cardiologist  Direct Dial: [phone]  Fax: [phone]  Website:  [email or web address] 06/07/2018, 2:10 PM

## 2018-07-19 ENCOUNTER — Ambulatory Visit (INDEPENDENT_AMBULATORY_CARE_PROVIDER_SITE_OTHER): Payer: Managed Care, Other (non HMO) | Admitting: Physical Medicine and Rehabilitation

## 2018-07-19 ENCOUNTER — Encounter (INDEPENDENT_AMBULATORY_CARE_PROVIDER_SITE_OTHER): Payer: Self-pay | Admitting: Physical Medicine and Rehabilitation

## 2018-07-19 ENCOUNTER — Telehealth (INDEPENDENT_AMBULATORY_CARE_PROVIDER_SITE_OTHER): Payer: Self-pay | Admitting: Physical Medicine and Rehabilitation

## 2018-07-19 VITALS — BP 133/91 | HR 85 | Ht 70.5 in | Wt 235.0 lb

## 2018-07-19 DIAGNOSIS — M5416 Radiculopathy, lumbar region: Secondary | ICD-10-CM | POA: Diagnosis not present

## 2018-07-19 DIAGNOSIS — M5116 Intervertebral disc disorders with radiculopathy, lumbar region: Secondary | ICD-10-CM | POA: Diagnosis not present

## 2018-07-19 DIAGNOSIS — M47816 Spondylosis without myelopathy or radiculopathy, lumbar region: Secondary | ICD-10-CM

## 2018-07-19 DIAGNOSIS — M5441 Lumbago with sciatica, right side: Secondary | ICD-10-CM

## 2018-07-19 DIAGNOSIS — M5442 Lumbago with sciatica, left side: Secondary | ICD-10-CM | POA: Diagnosis not present

## 2018-07-19 DIAGNOSIS — G8929 Other chronic pain: Secondary | ICD-10-CM

## 2018-07-19 MED ORDER — CELECOXIB 200 MG PO CAPS
200.0000 mg | ORAL_CAPSULE | Freq: Two times a day (BID) | ORAL | 3 refills | Status: DC | PRN
Start: 2018-07-19 — End: 2019-08-05

## 2018-07-19 NOTE — Progress Notes (Signed)
.   .  Numeric Pain Rating Scale and Functional Assessment Average Pain 5 Pain Right Now 4 My pain is constant, dull and aching Pain is worse with: walking, bending and some activites Pain improves with: rest   In the last MONTH (on 0-10 scale) has pain interfered with the following?  1. General activity like being  able to carry out your everyday physical activities such as walking, climbing stairs, carrying groceries, or moving a chair?  Rating(4)  2. Relation with others like being able to carry out your usual social activities and roles such as  activities at home, at work and in your community. Rating(3)  3. Enjoyment of life such that you have  been bothered by emotional problems such as feeling anxious, depressed or irritable?  Rating(0)

## 2018-07-19 NOTE — Telephone Encounter (Signed)
Per [employer] representative prior authorization is not needed for [address]. Reference [id number]

## 2018-07-24 ENCOUNTER — Ambulatory Visit (INDEPENDENT_AMBULATORY_CARE_PROVIDER_SITE_OTHER): Payer: BC Managed Care – PPO | Admitting: Physical Medicine and Rehabilitation

## 2018-07-24 ENCOUNTER — Encounter (INDEPENDENT_AMBULATORY_CARE_PROVIDER_SITE_OTHER): Payer: Self-pay | Admitting: Physical Medicine and Rehabilitation

## 2018-07-24 ENCOUNTER — Ambulatory Visit (INDEPENDENT_AMBULATORY_CARE_PROVIDER_SITE_OTHER): Payer: Self-pay

## 2018-07-24 VITALS — BP 164/115 | HR 86 | Temp 98.4°F

## 2018-07-24 DIAGNOSIS — M5416 Radiculopathy, lumbar region: Secondary | ICD-10-CM | POA: Diagnosis not present

## 2018-07-24 MED ORDER — BETAMETHASONE SOD PHOS & ACET 6 (3-3) MG/ML IJ SUSP
12.0000 mg | Freq: Once | INTRAMUSCULAR | Status: AC
  Administered 2018-07-24: 12 mg

## 2018-07-24 NOTE — Patient Instructions (Signed)

## 2018-07-24 NOTE — Progress Notes (Signed)
 .  Numeric Pain Rating Scale and Functional Assessment Average Pain 5   In the last MONTH (on 0-10 scale) has pain interfered with the following?  1. General activity like being  able to carry out your everyday physical activities such as walking, climbing stairs, carrying groceries, or moving a chair?  Rating(3)   +Driver, -BT, -Dye Allergies.  

## 2018-07-24 NOTE — Progress Notes (Signed)
Jim Graham - 62 y.o. male MRN [id number]  Date of birth: 1956-07-03  Office Visit Note: Visit Date: 07/24/2018 PCP: Lupita Raider, MD Referred by: Lupita Raider, MD  Subjective: Chief Complaint  Patient presents with  . Lower Back - Pain   HPI:  Jim Graham is a 62 y.o. male who comes in today For planned left S1 transforaminal epidural steroid injection for chronic worsening left hip and leg pain.  Please see our prior evaluation and management note for further details and justification.  ROS Otherwise per HPI.  Assessment & Plan: Visit Diagnoses:  1. Lumbar radiculopathy     Plan: No additional findings.   Meds & Orders:  Meds ordered this encounter  Medications  . betamethasone acetate-betamethasone sodium phosphate (CELESTONE) injection 12 mg    Orders Placed This Encounter  Procedures  . XR C-ARM NO REPORT  . Epidural Steroid injection    Follow-up: Return if symptoms worsen or fail to improve.   Procedures: No procedures performed  S1 Lumbosacral Transforaminal Epidural Steroid Injection - Sub-Pedicular Approach with Fluoroscopic Guidance   Patient: Jim Graham      Date of Birth: 1956-05-27 MRN: [id number] PCP: Lupita Raider, MD      Visit Date: 07/24/2018   Universal Protocol:    Date/Time: 10/09/199:16 AM  Consent Given By: the patient  Position:  PRONE  Additional Comments: Vital signs were monitored before and after the procedure. Patient was prepped and draped in the usual sterile fashion. The correct patient, procedure, and site was verified.   Injection Procedure Details:  Procedure Site One Meds Administered:  Meds ordered this encounter  Medications  . betamethasone acetate-betamethasone sodium phosphate (CELESTONE) injection 12 mg    Laterality: Left  Location/Site:  S1 Foramen   Needle size: 22 ga.  Needle type: Spinal  Needle Placement: Transforaminal  Findings:   -Comments: Excellent flow of  contrast along the nerve and into the epidural space.  Procedure Details: After squaring off the sacral end-plate to get a true AP view, the C-arm was positioned so that the best possible view of the S1 foramen was visualized. The soft tissues overlying this structure were infiltrated with 2-3 ml. of 1% Lidocaine without Epinephrine.    The spinal needle was inserted toward the target using a "trajectory" view along the fluoroscope beam.  Under AP and lateral visualization, the needle was advanced so it did not puncture dura. Biplanar projections were used to confirm position. Aspiration was confirmed to be negative for CSF and/or blood. A 1-2 ml. volume of Isovue-250 was injected and flow of contrast was noted at each level. Radiographs were obtained for documentation purposes.   After attaining the desired flow of contrast documented above, a 0.5 to 1.0 ml test dose of 0.25% Marcaine was injected into each respective transforaminal space.  The patient was observed for 90 seconds post injection.  After no sensory deficits were reported, and normal lower extremity motor function was noted,   the above injectate was administered so that equal amounts of the injectate were placed at each foramen (level) into the transforaminal epidural space.   Additional Comments:  The patient tolerated the procedure well Dressing: Band-Aid    Post-procedure details: Patient was observed during the procedure. Post-procedure instructions were reviewed.  Patient left the clinic in stable condition.    Clinical History: MRI LUMBAR SPINE WITHOUT CONTRAST 2013  Technique:  Multiplanar and multiecho pulse sequences of the lumbar spine were obtained without intravenous contrast.  Comparison: MRI 07/29/2010  Findings: T12-L1, L1-2 and L2-3 are normal.  L3-4:  Desiccation and mild circumferential bulging of the disc. Mild narrowing of both lateral recesses without gross neural compression.  L4-5:   Desiccation and circumferential bulging of the disc.  Mild facet and ligamentous hypertrophy.  Mild narrowing of the lateral recesses without gross neural compression.  L5-S1:  Disc degeneration with endplate osteophytes and bulging of the disc.  Discogenic edema within the adjacent endplates, particularly towards the left.  This could be associated with back pain.  Sufficient patency of the central canal.  Foraminal narrowing on the left because of osteophyte and bulging disc material could effect the exiting L5 nerve root.  Since the previous examination, the degenerative disc disease at L5- S1 has worsened with particular increase in the discogenic edema and some increase in the left foraminal stenosis.  IMPRESSION: Disc bulges at L3-4 and L4-5 with mild narrowing of the lateral recesses but no definite neural compression.  Progressive degenerative disc disease at L5-S1, particularly on the left.  Worsening of discogenic edema which could be associate with back pain.  Foraminal stenosis on the left because of osteophyte and bulging disc material that could effect the left L5 nerve root.     Objective:  VS:  HT:    WT:   BMI:     BP:(!) 164/115  HR:86bpm  TEMP:98.4 F (36.9 C)(Oral)  RESP:  Physical Exam  Ortho Exam Imaging: No results found.

## 2018-07-24 NOTE — Procedures (Signed)
S1 Lumbosacral Transforaminal Epidural Steroid Injection - Sub-Pedicular Approach with Fluoroscopic Guidance   Patient: Jim Graham      Date of Birth: 12-29-1955 MRN: [id number] PCP: Lupita Raider, MD      Visit Date: 07/24/2018   Universal Protocol:    Date/Time: 10/09/199:16 AM  Consent Given By: the patient  Position:  PRONE  Additional Comments: Vital signs were monitored before and after the procedure. Patient was prepped and draped in the usual sterile fashion. The correct patient, procedure, and site was verified.   Injection Procedure Details:  Procedure Site One Meds Administered:  Meds ordered this encounter  Medications  . betamethasone acetate-betamethasone sodium phosphate (CELESTONE) injection 12 mg    Laterality: Left  Location/Site:  S1 Foramen   Needle size: 22 ga.  Needle type: Spinal  Needle Placement: Transforaminal  Findings:   -Comments: Excellent flow of contrast along the nerve and into the epidural space.  Procedure Details: After squaring off the sacral end-plate to get a true AP view, the C-arm was positioned so that the best possible view of the S1 foramen was visualized. The soft tissues overlying this structure were infiltrated with 2-3 ml. of 1% Lidocaine without Epinephrine.    The spinal needle was inserted toward the target using a "trajectory" view along the fluoroscope beam.  Under AP and lateral visualization, the needle was advanced so it did not puncture dura. Biplanar projections were used to confirm position. Aspiration was confirmed to be negative for CSF and/or blood. A 1-2 ml. volume of Isovue-250 was injected and flow of contrast was noted at each level. Radiographs were obtained for documentation purposes.   After attaining the desired flow of contrast documented above, a 0.5 to 1.0 ml test dose of 0.25% Marcaine was injected into each respective transforaminal space.  The patient was observed for 90 seconds post  injection.  After no sensory deficits were reported, and normal lower extremity motor function was noted,   the above injectate was administered so that equal amounts of the injectate were placed at each foramen (level) into the transforaminal epidural space.   Additional Comments:  The patient tolerated the procedure well Dressing: Band-Aid    Post-procedure details: Patient was observed during the procedure. Post-procedure instructions were reviewed.  Patient left the clinic in stable condition.

## 2018-08-12 ENCOUNTER — Ambulatory Visit
Admission: RE | Admit: 2018-08-12 | Discharge: 2018-08-12 | Disposition: A | Discharge status: Home / Self Care | Payer: BC Managed Care – PPO | Source: Ambulatory Visit | Attending: Physical Medicine and Rehabilitation | Admitting: Physical Medicine and Rehabilitation

## 2018-08-12 DIAGNOSIS — G8929 Other chronic pain: Secondary | ICD-10-CM

## 2018-08-12 DIAGNOSIS — M5441 Lumbago with sciatica, right side: Principal | ICD-10-CM

## 2018-08-12 DIAGNOSIS — M5442 Lumbago with sciatica, left side: Principal | ICD-10-CM

## 2018-08-19 ENCOUNTER — Telehealth (INDEPENDENT_AMBULATORY_CARE_PROVIDER_SITE_OTHER): Payer: Self-pay | Admitting: Physical Medicine and Rehabilitation

## 2018-08-19 NOTE — Telephone Encounter (Signed)
Called [employer] and spoke with [name] and she states no PA is required for [address]. Reference#[name].  Called pt and lvm #1.

## 2018-08-19 NOTE — Telephone Encounter (Signed)
Is auth needed for this?

## 2018-08-19 NOTE — Telephone Encounter (Signed)
Bilateral L4 TF esi 

## 2018-08-23 NOTE — Telephone Encounter (Signed)
Scheduled for 11/27 at 1100 with driver. Patient is feeling somewhat better, so e will decide closer to that time if he wants another injection or just MRI review.

## 2018-09-07 ENCOUNTER — Other Ambulatory Visit: Payer: Self-pay | Admitting: Internal Medicine

## 2018-09-11 ENCOUNTER — Encounter (INDEPENDENT_AMBULATORY_CARE_PROVIDER_SITE_OTHER): Payer: Self-pay | Admitting: Physical Medicine and Rehabilitation

## 2018-09-11 ENCOUNTER — Ambulatory Visit (INDEPENDENT_AMBULATORY_CARE_PROVIDER_SITE_OTHER): Payer: BC Managed Care – PPO | Admitting: Physical Medicine and Rehabilitation

## 2018-09-11 VITALS — BP 127/88 | HR 95 | Temp 98.5°F

## 2018-09-11 DIAGNOSIS — M48062 Spinal stenosis, lumbar region with neurogenic claudication: Secondary | ICD-10-CM | POA: Diagnosis not present

## 2018-09-11 DIAGNOSIS — M545 Low back pain: Secondary | ICD-10-CM | POA: Diagnosis not present

## 2018-09-11 DIAGNOSIS — G8929 Other chronic pain: Secondary | ICD-10-CM | POA: Diagnosis not present

## 2018-09-11 DIAGNOSIS — M47816 Spondylosis without myelopathy or radiculopathy, lumbar region: Secondary | ICD-10-CM | POA: Diagnosis not present

## 2018-09-11 NOTE — Progress Notes (Signed)
 .  Numeric Pain Rating Scale and Functional Assessment Average Pain 2   In the last MONTH (on 0-10 scale) has pain interfered with the following?  1. General activity like being  able to carry out your everyday physical activities such as walking, climbing stairs, carrying groceries, or moving a chair?  Rating(0)   +Driver, -BT, -Dye Allergies.  

## 2018-09-16 ENCOUNTER — Encounter (INDEPENDENT_AMBULATORY_CARE_PROVIDER_SITE_OTHER): Payer: Self-pay | Admitting: Physical Medicine and Rehabilitation

## 2018-09-16 NOTE — Progress Notes (Signed)
Jim Graham - 62 y.o. male MRN [id number]  Date of birth: 12-06-1955  Office Visit Note: Visit Date: 09/11/2018 PCP: Lupita Raider, MD Referred by: Lupita Raider, MD  Subjective: Chief Complaint  Patient presents with  . Lower Back - Pain   HPI: Jim Graham is a 62 y.o. male who comes in today For MRI review as well as further evaluation management of his back and left leg pain.  Recent left S1 transforaminal epidural injection took a little while to work but is really helped quite a bit.  He reports still some back soreness particular in the mornings when he first gets up.  His average pain really is only at 2 now.  It was a 6 out of 10 prior to the injection.  He has done well with intermittent injections.  Prior to the last visit I have not seen him in quite some time.  We did decide to update MRI because it has been quite a while since we did that.  His last MRI was in 2013 and he had some sign showing some early stenosis at L4-5 and we just want to get an idea of the disc herniation on the left at L5-S1.  He is very active individual though he has retired now from the [employer].  He now works as a Midwife.  He has had no new symptoms since I saw him last.  No focal weakness.  Does get pain with walking and he does ambulate with a forward flexed spine.  He does a lot of foam rolling which seems to help.  Still gets pain down the left leg at times but this improved significantly at this point.  MRI was obtained and reviewed with the patient and this is reviewed below.  Review of Systems  Constitutional: Negative for chills, fever, malaise/fatigue and weight loss.  HENT: Negative for hearing loss and sinus pain.   Eyes: Negative for blurred vision, double vision and photophobia.  Respiratory: Negative for cough and shortness of breath.   Cardiovascular: Negative for chest pain, palpitations and leg swelling.  Gastrointestinal: Negative for abdominal pain, nausea and  vomiting.  Genitourinary: Negative for flank pain.  Musculoskeletal: Positive for back pain and joint pain. Negative for myalgias.  Skin: Negative for itching and rash.  Neurological: Negative for tremors, focal weakness and weakness.  Endo/Heme/Allergies: Negative.   Psychiatric/Behavioral: Negative for depression.  All other systems reviewed and are negative.  Otherwise per HPI.  Assessment & Plan: Visit Diagnoses:  1. Spinal stenosis of lumbar region with neurogenic claudication   2. Spondylosis without myelopathy or radiculopathy, lumbar region   3. Chronic bilateral low back pain without sciatica     Plan: Findings:  Chronic low back pain significant for lumbar spondylosis as well as prior disc herniation at L5-S1 more left paracentral and left radicular symptoms from that.  New MRI findings show increasing stenosis at L4-5 without listhesis.  This is moderate to severe with still some space left.  He could be getting symptoms at times for neurogenic claudication but right now he is doing well.  Disc herniation has regressed quite a bit but is still present more left paracentral.  He has degenerative changes and we discussed this and the ramifications of that.  We talked about activity modification and core strengthening and exercise. He is very active and wants to stay that way.  He does not like the idea of any surgery and really does not like the  idea of most medications.  He does use an anti-inflammatory as needed.  We talked about using anti-inflammatory on consecutive days for a couple weeks should he find the symptoms just barely starting to progress and that would be a safe way to potentially keep this at bay even longer but he is doing well otherwise.  From a surgical standpoint he has no listhesis and would do well potentially with decompression of the L4-5 level probably combined with microdiscectomy at L5-S1.  Interestingly there is a small bit of bony material likely representing  some discogenic changes at L5-S1 would probably lead to early fusion on its own.    Meds & Orders: No orders of the defined types were placed in this encounter.  No orders of the defined types were placed in this encounter.   Follow-up: Return if symptoms worsen or fail to improve.   Procedures: No procedures performed  No notes on file   Clinical History: MRI LUMBAR SPINE WITHOUT CONTRAST  TECHNIQUE: Multiplanar, multisequence MR imaging of the lumbar spine was performed. No intravenous contrast was administered.  COMPARISON:  04/17/2012  FINDINGS: Segmentation: 5 lumbar type vertebral bodies based on the lowest ribs  Alignment:  Physiologic.  Vertebrae: Edematous and fatty signal on both sides of the collapsed L5-S1 disc space, chronic but progressive from 2013 and considered degenerative. No fracture or aggressive bone lesion  Conus medullaris and cauda equina: Conus extends to the L1 level. Conus and cauda equina appear normal.  Paraspinal and other soft tissues: Negative  Disc levels:  L2-L3: Minor disc narrowing and bulging  L3-L4: Mild disc narrowing and bulging with small endplate spurs.  L4-L5: Disc narrowing and bulging. Posterior element hypertrophy. Moderate to advanced spinal stenosis. Both foramina are patent  L5-S1:Severe disc degeneration with collapse, bulge, and endplate ridging. Biforaminal impingement, particularly advanced on the left  IMPRESSION: 1. Disc degeneration that is focally advanced at L5-S1 where disc collapse and ridging causes biforaminal impingement, worse on the left. 2. L4-5 moderate to advanced degenerative spinal stenosis.   Electronically Signed   By: Marnee Spring M.D.   On: 08/13/2018 08:59   He reports that he has never smoked. He has never used smokeless tobacco. No results for input(s): HGBA1C, LABURIC in the last 8760 hours.  Objective:  VS:  HT:    WT:   BMI:     BP:127/88  HR:95bpm   TEMP:98.5 F (36.9 C)(Oral)  RESP:  Physical Exam  Constitutional: He is oriented to person, place, and time. He appears well-developed and well-nourished. No distress.  HENT:  Head: Normocephalic and atraumatic.  Eyes: Pupils are equal, round, and reactive to light. Conjunctivae are normal.  Neck: Normal range of motion. Neck supple.  Cardiovascular: Regular rhythm and intact distal pulses.  Pulmonary/Chest: Effort normal. No respiratory distress.  Musculoskeletal: He exhibits no edema or tenderness.  Patient ambulates with forward flexed lumbar spine.  He is stiff with lumbar extension and concordant low back pain with facet loading.  No pain over the PSIS or greater trochanters and no pain with hip rotation good distal strength.  Neurological: He is alert and oriented to person, place, and time. He exhibits normal muscle tone. Coordination normal.  Skin: Skin is warm and dry. No rash noted. No erythema.  Psychiatric: He has a normal mood and affect.  Nursing note and vitals reviewed.   Ortho Exam Imaging: No results found.  Past Medical/Family/Surgical/Social History: Medications & Allergies reviewed per EMR, new medications updated. Patient Active Problem  List   Diagnosis Date Noted  . History of arthroscopy of left shoulder 12/21/2016  . GERD (gastroesophageal reflux disease) 05/25/2015  . Agatston coronary artery calcium score less than 100 02/08/2015  . ED (erectile dysfunction) 02/08/2015  . Pulmonary nodules 02/08/2015  . Dilated aortic root (HCC) 02/08/2015  . Chest pain 08/24/2011  . Hyperlipidemia 08/24/2011  . Elevated BP 08/24/2011  . History of palpitations 12/16/2010   Past Medical History:  Diagnosis Date  . GERD (gastroesophageal reflux disease)   . Hyperlipidemia   . Palpitations    Family History  Adopted: Yes  Problem Relation Age of Onset  . Heart attack Paternal Grandfather    Past Surgical History:  Procedure Laterality Date  . bicep  re-attachment    . NASAL SINUS SURGERY     Social History   Occupational History  . 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 Smoking status: Never Smoker  . Smokeless tobacco: Never Used  Substance and Sexual Activity  . Alcohol use: No  . Drug use: No  . Sexual activity: Not on file

## 2018-12-04 NOTE — Progress Notes (Signed)
Jim Graham - 63 y.o. male MRN [id number]  Date of birth: 10/23/1955  Office Visit Note: Visit Date: 07/19/2018 PCP: Lupita Raider, MD Referred by: Lupita Raider, MD  Subjective: Chief Complaint  Patient presents with  . Lower Back - Pain  . Right Leg - Pain  . Left Leg - Pain   HPI: Jim Graham is a 63 y.o. male who comes in today For reevaluation and management of low back pain with referral into both legs.  The last time I saw the patient was in 2017 and completed S1 transforaminal injection with good relief.  He says for several months now has had worsening similar symptoms down the posterior leg in the hamstring particularly left more than right but bilateral.  He has had no trauma.  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  He still tries to stay active with lifting weights and staying strong and stretching.  He has had multiple bouts of physical therapy in the past.  No recent or new images.  MRI is several years old at this point.  Prior history of transforaminal epidural injection with success for problematic disc herniation and facet joint arthritic problem.  Both of these combine for lateral recess stenosis and ongoing leg pain.  He has not had any focal weakness but the symptoms are clearly bilateral now.  He does get some dysesthesia.  Most of his symptoms with prolonged sitting but also some with standing and walking.  He has been taking anti-inflammatories without much relief.  Has had other medications in the past including gabapentin and pain medications with only mild success.  Chiropractic care as well.  Review of Systems  Musculoskeletal: Positive for back pain.       Bilateral radicular leg pain   Otherwise per HPI.  Assessment & Plan: Visit Diagnoses:  1. Chronic bilateral low back pain with bilateral sciatica   2. Radiculopathy due to lumbar intervertebral disc disorder   3. Lumbar radiculopathy   4. Spondylosis without myelopathy or  radiculopathy, lumbar region     Plan: Findings:  Chronic worsening low back and bilateral radicular lactic pain that seems to be related the L5-S1 lumbosacral junction as before.  Even though has had no new trauma he now has bilateral symptoms much more than he used to have.  I think given his current findings we need to update MRI of the lumbar spine because he really had no reason to have an alternate leg pain prior.  We will get a start him on Celebrex as well as I think that would be a good medication for him and he has no real history of heart disease but he does have some reflux.  We will see him back after the MRI and probably look at spine intervention at that point.  He is to continue to take breaks with driving as he does sit quite a bit.  He may be having some issues with the prolonged sitting.  We talked about core strengthening etc.    Meds & Orders:  Meds ordered this encounter  Medications  . celecoxib (CELEBREX) 200 MG capsule    Sig: Take 1 capsule (200 mg total) by mouth 2 (two) times daily as needed.    Dispense:  60 capsule    Refill:  3    Orders Placed This Encounter  Procedures  . MR LUMBAR SPINE WO CONTRAST    Follow-up: Return for Bilateral S1 transforaminal epidural  steroid injection.   Procedures: No procedures performed  No notes on file   Clinical History: MRI LUMBAR SPINE WITHOUT CONTRAST  TECHNIQUE: Multiplanar, multisequence MR imaging of the lumbar spine was performed. No intravenous contrast was administered.  COMPARISON:  04/17/2012  FINDINGS: Segmentation: 5 lumbar type vertebral bodies based on the lowest ribs  Alignment:  Physiologic.  Vertebrae: Edematous and fatty signal on both sides of the collapsed L5-S1 disc space, chronic but progressive from 2013 and considered degenerative. No fracture or aggressive bone lesion  Conus medullaris and cauda equina: Conus extends to the L1 level. Conus and cauda equina appear  normal.  Paraspinal and other soft tissues: Negative  Disc levels:  L2-L3: Minor disc narrowing and bulging  L3-L4: Mild disc narrowing and bulging with small endplate spurs.  L4-L5: Disc narrowing and bulging. Posterior element hypertrophy. Moderate to advanced spinal stenosis. Both foramina are patent  L5-S1:Severe disc degeneration with collapse, bulge, and endplate ridging. Biforaminal impingement, particularly advanced on the left  IMPRESSION: 1. Disc degeneration that is focally advanced at L5-S1 where disc collapse and ridging causes biforaminal impingement, worse on the left. 2. L4-5 moderate to advanced degenerative spinal stenosis.   Electronically Signed   By: Marnee Spring M.D.   On: 08/13/2018 08:59   He reports that he has never smoked. He has never used smokeless tobacco. No results for input(s): HGBA1C, LABURIC in the last 8760 hours.  Objective:  VS:  HT:5' 10.5" (179.1 cm)   WT:235 lb (106.6 kg)  BMI:33.23    BP:(!) 133/91  HR:85bpm  TEMP: ( )  RESP:  Physical Exam Vitals signs and nursing note reviewed.  Constitutional:      General: He is not in acute distress.    Appearance: Normal appearance. He is well-developed.  HENT:     Head: Normocephalic and atraumatic.  Eyes:     Conjunctiva/sclera: Conjunctivae normal.     Pupils: Pupils are equal, round, and reactive to light.  Neck:     Musculoskeletal: Normal range of motion and neck supple. No neck rigidity.  Cardiovascular:     Rate and Rhythm: Normal rate.     Pulses: Normal pulses.     Heart sounds: Normal heart sounds.  Pulmonary:     Effort: Pulmonary effort is normal. No respiratory distress.  Musculoskeletal:     Right lower leg: No edema.     Left lower leg: No edema.     Comments: Can go from sit to stand without great difficulty but does have pain with full extension and facet loading.  He has no pain over the greater trochanters no pain over the PSIS no pain with hip  rotation has good distal strength.  Has equivocally positive slump test bilaterally.  No clonus.  Skin:    General: Skin is warm and dry.     Findings: No erythema or rash.  Neurological:     General: No focal deficit present.     Mental Status: He is alert and oriented to person, place, and time.     Sensory: No sensory deficit.     Coordination: Coordination normal.     Gait: Gait normal.  Psychiatric:        Mood and Affect: Mood normal.        Behavior: Behavior normal.     Ortho Exam Imaging: No results found.  Past Medical/Family/Surgical/Social History: Medications & Allergies reviewed per EMR, new medications updated. Patient Active Problem List   Diagnosis Date Noted  .  History of arthroscopy of left shoulder 12/21/2016  . GERD (gastroesophageal reflux disease) 05/25/2015  . Agatston coronary artery calcium score less than 100 02/08/2015  . ED (erectile dysfunction) 02/08/2015  . Pulmonary nodules 02/08/2015  . Dilated aortic root (HCC) 02/08/2015  . Chest pain 08/24/2011  . Hyperlipidemia 08/24/2011  . Elevated BP 08/24/2011  . History of palpitations 12/16/2010   Past Medical History:  Diagnosis Date  . GERD (gastroesophageal reflux disease)   . Hyperlipidemia   . Palpitations    Family History  Adopted: Yes  Problem Relation Age of Onset  . Heart attack Paternal Grandfather    Past Surgical History:  Procedure Laterality Date  . bicep re-attachment    . NASAL SINUS SURGERY     Social History   Occupational History  . 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 Smoking status: Never Smoker  . Smokeless tobacco: Never Used  Substance and Sexual Activity  . Alcohol use: No  . Drug use: No  . Sexual activity: Not on file

## 2019-06-30 ENCOUNTER — Ambulatory Visit: Payer: BC Managed Care – PPO | Admitting: Internal Medicine

## 2019-08-05 ENCOUNTER — Other Ambulatory Visit (INDEPENDENT_AMBULATORY_CARE_PROVIDER_SITE_OTHER): Payer: Self-pay | Admitting: Physical Medicine and Rehabilitation

## 2019-08-05 NOTE — Telephone Encounter (Signed)
Please advise 

## 2019-08-11 ENCOUNTER — Other Ambulatory Visit: Payer: Self-pay

## 2019-08-11 DIAGNOSIS — Z20822 Contact with and (suspected) exposure to covid-19: Secondary | ICD-10-CM

## 2019-08-12 LAB — NOVEL CORONAVIRUS, NAA: SARS-CoV-2, NAA: NOT DETECTED

## 2019-10-03 ENCOUNTER — Other Ambulatory Visit: Payer: Self-pay | Admitting: Internal Medicine

## 2019-10-13 ENCOUNTER — Telehealth: Payer: Self-pay | Admitting: Internal Medicine

## 2019-10-13 NOTE — Telephone Encounter (Signed)
Patient is calling stating he had lab results from [facility] sent over.

## 2019-10-14 ENCOUNTER — Ambulatory Visit: Payer: BC Managed Care – PPO | Admitting: Internal Medicine

## 2019-10-14 ENCOUNTER — Encounter

## 2019-10-14 NOTE — Telephone Encounter (Signed)
Labs are in [place] - appears lipid & metabolic panel were done, possibly other labs  10/08/2019 TC: 136 HDL: 40 LDL: 76 Trig: 108  Routed to MD to review

## 2019-10-14 NOTE — Telephone Encounter (Signed)
Ok .. looks good. Thanks.

## 2019-10-14 NOTE — Telephone Encounter (Signed)
Should stay on current dose of Crestor No indication for follow-up calcium score Ok to wait until next Summer for follow-up.

## 2019-10-14 NOTE — Telephone Encounter (Signed)
Patient called about labs.   He would like to know... 1. if he can decrease crestor to 10mg  QD (not sure what his goal LDL is) 2. if he could have or needs to have another CT calcium score test (advised him that this is a screening test, typically done once) 3. if it is OK to wait until the summer 2021 for follow up as PCP is checking his labs routinely now that he is on testosterone inj qweekly.

## 2019-10-14 NOTE — Telephone Encounter (Signed)
Patient called with MD advice as noted below. March 2021 visit cancelled and recall for summer 2021 entered

## 2019-10-31 ENCOUNTER — Other Ambulatory Visit: Payer: Self-pay | Admitting: Internal Medicine

## 2019-11-13 ENCOUNTER — Other Ambulatory Visit: Payer: Self-pay | Admitting: Internal Medicine

## 2019-12-23 ENCOUNTER — Other Ambulatory Visit: Payer: Self-pay | Admitting: Internal Medicine

## 2019-12-26 ENCOUNTER — Telehealth: Payer: Self-pay | Admitting: Internal Medicine

## 2019-12-26 MED ORDER — ROSUVASTATIN CALCIUM 10 MG PO TABS: ORAL_TABLET | ORAL | 0 refills | Status: DC

## 2019-12-26 NOTE — Telephone Encounter (Signed)
°*  STAT* If patient is at the pharmacy, call can be transferred to refill team.   1. Which medications need to be refilled? (please list name of each medication and dose if known) rosuvastatin (CRESTOR) 10 MG tablet  2. Which pharmacy/location (including street and city if local pharmacy) is medication to be sent to? [id number] - [place], [place] - [address]  3. Do they need a 30 day or 90 day supply? 30  Patient is out of medication. He stated that Dr. Rennis Golden told him as long as his labs were good, he did not need an appointment.

## 2020-01-07 ENCOUNTER — Other Ambulatory Visit: Payer: Self-pay | Admitting: Internal Medicine

## 2020-01-07 NOTE — Telephone Encounter (Signed)
*  STAT* If patient is at the pharmacy, call can be transferred to refill team.   1. Which medications need to be refilled? (please list name of each medication and dose if known)  rosuvastatin (CRESTOR) 10 MG tablet   2. Which pharmacy/location (including street and city if local pharmacy) is medication to be sent to?  [id number] - [place], [place] - [address]  3. Do they need a 30 day or 90 day supply? 30

## 2020-01-13 ENCOUNTER — Ambulatory Visit: Payer: BC Managed Care – PPO | Admitting: Internal Medicine

## 2020-02-09 ENCOUNTER — Telehealth: Payer: Self-pay | Admitting: Internal Medicine

## 2020-02-09 NOTE — Telephone Encounter (Signed)
Patient states his PCP sent his lab results for Dr. Rennis Golden. He would like to know if they were received.

## 2020-02-09 NOTE — Telephone Encounter (Signed)
Labs from [facility]  01/20/2020  TC: 130 HDL: 40 LDL: 77 Trig: 60

## 2020-02-10 NOTE — Telephone Encounter (Signed)
Thanks .. labs look good.  Dr Rexene Edison

## 2020-02-10 NOTE — Telephone Encounter (Signed)
Patient aware MD has reviewed results.   He is inquiring about a repeat CT calcium score test again. Will notify MD  F/U scheduled for July 2021

## 2020-02-10 NOTE — Telephone Encounter (Signed)
Patient relayed MD response. Voiced understanding.

## 2020-02-10 NOTE — Telephone Encounter (Signed)
Good for him, but CAC score repeat is not indicated, not a serial test to measure response to treatment - in rare instances ok to repeat after 10 years.   Dr. Rexene Edison

## 2020-04-02 ENCOUNTER — Other Ambulatory Visit: Payer: Self-pay | Admitting: Physical Medicine and Rehabilitation

## 2020-04-02 ENCOUNTER — Telehealth: Payer: Self-pay | Admitting: Physical Medicine and Rehabilitation

## 2020-04-02 MED ORDER — METHOCARBAMOL 500 MG PO TABS: 500.0000 mg | ORAL_TABLET | Freq: Three times a day (TID) | ORAL | 0 refills | Status: DC | PRN

## 2020-04-02 MED ORDER — CELECOXIB 200 MG PO CAPS: 200.0000 mg | ORAL_CAPSULE | Freq: Two times a day (BID) | ORAL | 3 refills | Status: DC | PRN

## 2020-04-02 NOTE — Telephone Encounter (Signed)
Patient called.   He is requesting a call back to set up an appointment for an injection.   Call back: [phone]

## 2020-04-02 NOTE — Progress Notes (Signed)
Pt called in with back pain flare, injection scheduled. Medications refilled.

## 2020-04-02 NOTE — Telephone Encounter (Signed)
Patient called asking for a call back from Dr. Eunice Blas nurse. Patient did not specify nature of his call. Patient phone number is [phone].

## 2020-04-02 NOTE — Telephone Encounter (Signed)
Please Advise Rx 

## 2020-04-02 NOTE — Telephone Encounter (Signed)
Patient is scheduled for 8/6 with driver. He is requesting a muscle relaxer and a refill of Celebrex (generic requested). Pharmacy is correct.

## 2020-04-02 NOTE — Telephone Encounter (Signed)
done

## 2020-04-05 MED ORDER — CYCLOBENZAPRINE HCL 5 MG PO TABS: 5.0000 mg | ORAL_TABLET | Freq: Every day | ORAL | 1 refills | Status: DC

## 2020-04-05 NOTE — Telephone Encounter (Signed)
R/S pt appt to 05/18/20 with driver.

## 2020-04-05 NOTE — Addendum Note (Signed)
Addended by: Ashok Norris on: 04/05/2020 11:55 AM   Modules accepted: Orders

## 2020-04-28 ENCOUNTER — Other Ambulatory Visit: Payer: Self-pay

## 2020-04-28 ENCOUNTER — Ambulatory Visit (INDEPENDENT_AMBULATORY_CARE_PROVIDER_SITE_OTHER): Payer: BC Managed Care – PPO | Admitting: Internal Medicine

## 2020-04-28 ENCOUNTER — Encounter: Payer: Self-pay | Admitting: Internal Medicine

## 2020-04-28 VITALS — BP 114/86 | HR 70 | Ht 70.0 in | Wt 212.0 lb

## 2020-04-28 DIAGNOSIS — I77810 Thoracic aortic ectasia: Secondary | ICD-10-CM | POA: Diagnosis not present

## 2020-04-28 DIAGNOSIS — R931 Abnormal findings on diagnostic imaging of heart and coronary circulation: Secondary | ICD-10-CM

## 2020-04-28 DIAGNOSIS — E785 Hyperlipidemia, unspecified: Secondary | ICD-10-CM

## 2020-04-28 NOTE — Patient Instructions (Signed)
Medication Instructions:   No changes *If you need a refill on your cardiac medications before your next appointment, please call your pharmacy*   Lab Work:  Not needed   Testing/Procedures: Not  needed   Follow-Up: At [facility], you and your health needs are our priority.  As part of our continuing mission to provide you with exceptional heart care, we have created designated Provider Care Teams.  These Care Teams include your primary Cardiologist (physician) and Advanced Practice Providers (APPs -  Physician Assistants and Nurse Practitioners) who all work together to provide you with the care you need, when you need it.     Your next appointment:   12 month(s)  The format for your next appointment:   In Person  Provider:   K. Chad Hilty, MD    

## 2020-04-28 NOTE — Progress Notes (Signed)
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 He is also on chronic suppressive therapy for HSV. He was praised evaluated for atypical chest pain and palpitations in 2012. He underwent an exercise echocardiogram which was negative for ischemia. He also wore monitor for. At time. Symptoms improved and were related to stress. He does not know his medical history secondary to being adopted. Recently he's had more discomfort mostly in his left shoulder and some in the left upper chest but no evidence for substernal chest pain. He's also been having worsening symptoms which she attributes to reflux. He was switched from Nexium to Dexilant. In addition, he notes that when he takes Zantac he has marked improvement in his symptoms. 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  The pleasure see [name] back in the office today. Overall he is doing well denies any further chest pain or shortness of breath. In fact she's managed to lose about 20 pounds since his last office visit. He says he's on the [employer]. This is a combination of caloric restriction, hCG, other vitamins and appetite suppressants. He also continues to exercise. He had a lipid profile performed which showed total cholesterol 140, glyceride 66, HL 43, LDL 84. He's currently on Crestor 5 mg daily. The CT scan of his coronary Arteries was performed which demonstrated a low coronary artery calcium score of 46, but was located in the proximal to mid LAD. The aortic root was mildly dilated 4.0 cm. An over read by [facility] radiology indicated several small subcentimeter pulmonary nodules, the largest being 5 mm. A repeat CT scan  was recommended in one year. He is not a smoker and at lower risk for bronchogenic carcinoma. In addition, today he reported some recent erectile dysfunction, which was unrelated to her follow-up. He is asking about Viagra.  Jim Graham returns today for follow-up. Over the weekend he had an episode where he developed pain on the left and right sides of his chest laterally that radiated down his arms. There is no central chest discomfort. It seemed to occurred fairly recently after eating. He did take a Zantac and went to the emergency room. He said he waited outside of the emergency room and his symptoms resolved and he ultimately was never seen. He then started taking his Dexilant again. He has noted some improvement in his symptoms but is not completely resolved. He says he recently saw his gastroenterologist but was not having any symptoms at the time. He continues to exercise at a significant rate without any chest pain or shortness of breath.  Jim Graham returns today for follow-up. He reports some occasional left shoulder pain but is able to exercise at a high rate without any chest pain or worsening shortness of breath. He exercises fairly regularly. He recently thought he was having side effects from his Crestor discontinued that however the symptoms did not go away and he restarted his medicine. He is due for repeat lipid profile. He is also due for one year follow-up of his last chest CT which demonstrated several small sub-pulmonary nodules. There was also a mildly dilated aortic root to 4.0 cm.  04/23/2017  Mr.  Graham was seen today in follow-up. He underwent a recent repeat CT which showed no evidence of aortic dilatation and no change in his small subpulmonary nodules. No further CT was recommended. He denies any chest pain or worsening shortness of breath. He continues to exercise regularly. 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 We  recently received labs from his primary care provider which shows a total cholesterol of 149, temperature glyceride 88, HDL-C 40, LDL-C 91 and non-HDL-C of 384. I review the results today including his 10 year and 73 year MESA and pulled cohort risk assessments given his coronary artery calcium. He is at 6.5 and 7.5% risk respectively based on those databases and has a long-term risk of about 36% of developing a hard cardiovascular event. With this in mind his LDL-C is a goal less than 665, however his non-HDL-C is at goal <130. We discussed that based on current data if he were to be very aggressive about his medical therapy he may want to try to drive his LDL-C less than 70 or at least get his non-HDL-C less than 100. We may accomplish this with a small increase in his Crestor.  06/07/2018  Jim Graham seen today in follow-up.  Overall seems to be doing well.  He denies any chest pain or shortness of breath.  He exercises regularly.  Recently gained some weight combination of both muscle mass and he feels like he may be more sedentary.  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  Recent labs in April 2019 showed total cholesterol 158, HDL 45, LDL 87 and triglycerides 132.  EKG sinus rhythm at 84.  04/28/2020  Jim Graham is seen today in follow-up.  He seems to be doing very well.  He is exercising more regularly.  He had gained some weight during the pandemic but is managed to get it off and generally is feeling much better.  His lipids have improved significantly.  LDL cholesterol is around 70.  EKG is normal.  His blood pressure is well controlled.  PMHx:  Past Medical History:  Diagnosis Date   GERD (gastroesophageal reflux disease)    Hyperlipidemia    Palpitations     Past Surgical History:  Procedure Laterality Date   bicep re-attachment     NASAL SINUS SURGERY      FAMHx:  Family History  Adopted: Yes  Problem  Relation Age of Onset   Heart attack Paternal Grandfather     SOCHx:   reports that he has never smoked. He has never used smokeless tobacco. He reports that he does not drink alcohol and does not use drugs.  ALLERGIES:  No Known Allergies  ROS: Pertinent items noted in HPI and remainder of comprehensive ROS otherwise negative.  HOME MEDS: Current Outpatient Medications  Medication Sig Dispense Refill   aspirin 81 MG tablet Take 81 mg by mouth daily.       celecoxib (CELEBREX) 200 MG capsule Take 1 capsule (200 mg total) by mouth 2 (two) times daily as needed. (Patient taking differently: Take 200 mg by mouth as needed. ) 60 capsule 3   Coenzyme Q10 100 MG capsule Take 100 mg by mouth daily.     cyclobenzaprine (FLEXERIL) 5 MG tablet Take 1-2 tablets (5-10 mg total) by mouth at bedtime. 30 tablet 1   Docusate Sodium (SILACE PO) Take 5 mg by  mouth daily.     Multiple Vitamin (MULTIVITAMIN) capsule Take 1 capsule by mouth daily.     rosuvastatin (CRESTOR) 10 MG tablet TAKE 1 TABLET BY MOUTH AT 1PM AND 1 TAB AT DINNER. 15 tablet 0   valACYclovir (VALTREX) 500 MG tablet Take 1 tablet by mouth 2 (two) times daily.     No current facility-administered medications for this visit.    LABS/IMAGING: No results found for this or any previous visit (from the past 48 hour(s)). No results found.  VITALS: BP 114/86 (BP Location: Left Arm, Patient Position: Sitting, Cuff Size: Normal)    Pulse 70    Ht 5\' 10"  (1.778 m)    Wt 212 lb (96.2 kg)    BMI 30.42 kg/m   EXAM: General appearance: alert and no distress Neck: no carotid bruit, no JVD and thyroid not enlarged, symmetric, no tenderness/mass/nodules Lungs: clear to auscultation bilaterally Heart: regular rate and rhythm, S1, S2 normal, no murmur, click, rub or gallop Abdomen: soft, non-tender; bowel sounds normal; no masses,  no organomegaly Extremities: extremities normal, atraumatic, no cyanosis or edema Pulses: 2+ and  symmetric Skin: Skin color, texture, turgor normal. No rashes or lesions Neurologic: Grossly normal Psych: Pleasant  EKG: Normal sinus rhythm at 70-personally reviewed  ASSESSMENT: 1. Atypical left upper chest/shoulder pain - low coronary calcium score 46 2. GERD 3. Dyslipidemia 4. Erectile dysfunction 5. Mildly dilated aortic root to 4.0 cm - not noted in follow-up studies 6. Subcentimeter pulmonary nodules - stable, do not require follow-up 7. Low testosterone  PLAN: 1.   Jim Graham continues to do well.  He is on aggressive medical therapy.  Currently on rosuvastatin 20 mg daily with LDL now around 70.  Blood pressure is controlled. He continues on supplemental testosterone and has had issues with high hematocrit requiring blood donation. This is managed by his PCP.  Follow-up with me annually or sooner as necessary.  Abran Cantor, MD, [facility], FACP  [place]   [facility]  Medical Director of the [facility] Lipid Disorders &  Cardiovascular Risk Reduction Clinic Diplomate of the American Board of Clinical Lipidology Attending Cardiologist  Direct Dial: [phone]   Fax: [phone]  Website:  [email or web address] Fatiha Guzy 04/28/2020, 11:38 AM

## 2020-05-18 ENCOUNTER — Encounter: Payer: Self-pay | Admitting: Physical Medicine and Rehabilitation

## 2020-05-18 ENCOUNTER — Ambulatory Visit: Payer: BC Managed Care – PPO | Admitting: Physical Medicine and Rehabilitation

## 2020-05-18 ENCOUNTER — Ambulatory Visit: Payer: Self-pay

## 2020-05-18 ENCOUNTER — Other Ambulatory Visit: Payer: Self-pay

## 2020-05-18 DIAGNOSIS — M5416 Radiculopathy, lumbar region: Secondary | ICD-10-CM

## 2020-05-18 DIAGNOSIS — M7071 Other bursitis of hip, right hip: Secondary | ICD-10-CM

## 2020-05-18 DIAGNOSIS — M48062 Spinal stenosis, lumbar region with neurogenic claudication: Secondary | ICD-10-CM | POA: Diagnosis not present

## 2020-05-18 NOTE — Progress Notes (Signed)
Right buttock and upper thigh pain. Still runs every day. Patient does not have driver and states he does not have anyone. Numeric Pain Rating Scale and Functional Assessment Average Pain 4   In the last MONTH (on 0-10 scale) has pain interfered with the following?  1. General activity like being  able to carry out your everyday physical activities such as walking, climbing stairs, carrying groceries, or moving a chair?  Rating(0)   -Driver, -BT, -Dye Allergies.

## 2020-05-18 NOTE — Progress Notes (Signed)
Jim Graham - 64 y.o. male MRN [id number]  Date of birth: July 12, 1956  Office Visit Note: Visit Date: 05/18/2020 PCP: Lupita Raider, MD Referred by: Lupita Raider, MD  Subjective: Chief Complaint  Patient presents with  . Lower Back - Pain   HPI: Jim Graham is a 64 y.o. male who comes in today For evaluation management of several weeks now of chronic worsening right buttock and upper thigh pain in the posterior aspect.  No left-sided complaints.  No specific trauma.  Patient is well-known to me has a history of lumbar disc herniation L5-S1 which is resolved to a degree but now he has moderate severe stenosis at L4-5 with arthritis.  He is very active individual.  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  He rates his pain now as a 4 out of 10 but really does affect his daily living.  He has not had any paresthesias.  He feels like the pain is different than in the past but it is fairly consistent with what he has had before.  Somewhat on the opposite side of what we have dealt with in the past.  Last time I saw the patient was in 2019 after updating MRI.  He has been doing generally very well without much in the way of pain complaints that needed anything done.  He also reports some shoulder pain that he will be seeing Dr. Doneen Poisson in our office for.  Review of Systems  Musculoskeletal: Positive for back pain and joint pain.  All other systems reviewed and are negative.  Otherwise per HPI.  Assessment & Plan: Visit Diagnoses:  1. Ischial bursitis of right side   2. Spinal stenosis of lumbar region with neurogenic claudication   3. Lumbar radiculopathy     Plan: Findings:  Chronic long-term history of off-and-on back pain and more severe back pain over the last several years with history of disc herniation and lumbar stenosis.  Now with a couple of months onset of right buttock and upper hamstring pain.  He does have tenderness over the ischial  bursa.  He has pain with sitting more than standing.  He does have some pain with standing and walking but no real claudication symptoms.  I think at this point diagnostic ischial bursa injection can be performed with fluoroscopic guidance.  Depending on relief with that would look at perhaps epidural injection for the stenosis.  And regroup with home exercises and medication management temporarily if needed.  He will follow up with Dr. Magnus Ivan for his shoulder.    Meds & Orders: No orders of the defined types were placed in this encounter.   Orders Placed This Encounter  Procedures  . Large Joint Inj  . XR C-ARM NO REPORT    Follow-up: Return for Right L5-S1 interlaminar epidural steroid injection.   Procedures: Large Joint Inj (Right Ischial Bursa) on 05/18/2020 12:16 PM Indications: diagnostic evaluation and pain Details: 22 G 3.5 in needle, fluoroscopy-guided anterior approach  Arthrogram: No  Medications: 4 mL bupivacaine 0.25 %; 60 mg triamcinolone acetonide 40 MG/ML Outcome: tolerated well, no immediate complications  There was excellent flow of contrast producing a partial bursa-gram of the ischium.  Procedure, treatment alternatives, risks and benefits explained, specific risks discussed. Consent was given by the patient. Immediately prior to procedure a time out was called to verify the correct patient, procedure, equipment, support staff and site/side marked as required. Patient was prepped and draped  in the usual sterile fashion.      No notes on file   Clinical History: MRI LUMBAR SPINE WITHOUT CONTRAST  TECHNIQUE: Multiplanar, multisequence MR imaging of the lumbar spine was performed. No intravenous contrast was administered.  COMPARISON:  04/17/2012  FINDINGS: Segmentation: 5 lumbar type vertebral bodies based on the lowest ribs  Alignment:  Physiologic.  Vertebrae: Edematous and fatty signal on both sides of the collapsed L5-S1 disc space, chronic but  progressive from 2013 and considered degenerative. No fracture or aggressive bone lesion  Conus medullaris and cauda equina: Conus extends to the L1 level. Conus and cauda equina appear normal.  Paraspinal and other soft tissues: Negative  Disc levels:  L2-L3: Minor disc narrowing and bulging  L3-L4: Mild disc narrowing and bulging with small endplate spurs.  L4-L5: Disc narrowing and bulging. Posterior element hypertrophy. Moderate to advanced spinal stenosis. Both foramina are patent  L5-S1:Severe disc degeneration with collapse, bulge, and endplate ridging. Biforaminal impingement, particularly advanced on the left  IMPRESSION: 1. Disc degeneration that is focally advanced at L5-S1 where disc collapse and ridging causes biforaminal impingement, worse on the left. 2. L4-5 moderate to advanced degenerative spinal stenosis.   Electronically Signed   By: Marnee Spring M.D.   On: 08/13/2018 08:59   He reports that he has never smoked. He has never used smokeless tobacco. No results for input(s): HGBA1C, LABURIC in the last 8760 hours.  Objective:  VS:  HT:    WT:   BMI:     BP:   HR: bpm  TEMP: ( )  RESP:  Physical Exam Constitutional:      General: He is not in acute distress.    Appearance: Normal appearance. He is not ill-appearing.  HENT:     Head: Normocephalic and atraumatic.     Right Ear: External ear normal.     Left Ear: External ear normal.  Eyes:     Extraocular Movements: Extraocular movements intact.  Cardiovascular:     Rate and Rhythm: Normal rate.     Pulses: Normal pulses.  Abdominal:     General: There is no distension.     Palpations: Abdomen is soft.  Musculoskeletal:        General: No tenderness or signs of injury.     Right lower leg: No edema.     Left lower leg: No edema.     Comments: Patient has good distal strength without clonus.  Patient ambulates without aid he does have a forward flexed lumbar spine with  ambulation.  He has pain in the lower back with facet loading but is not concordant with his current pain.  He does have pain over the PSIS on the right and posterior upper part of the hamstring.  He does have tenderness over the right ischial bursa.  Skin:    Findings: No erythema or rash.  Neurological:     General: No focal deficit present.     Mental Status: He is alert and oriented to person, place, and time.     Sensory: No sensory deficit.     Motor: No weakness or abnormal muscle tone.     Coordination: Coordination normal.  Psychiatric:        Mood and Affect: Mood normal.        Behavior: Behavior normal.     Ortho Exam  Imaging: No results found.  Past Medical/Family/Surgical/Social History: Medications & Allergies reviewed per EMR, new medications updated. Patient Active Problem List  Diagnosis Date Noted  . History of arthroscopy of left shoulder 12/21/2016  . GERD (gastroesophageal reflux disease) 05/25/2015  . Agatston coronary artery calcium score less than 100 02/08/2015  . ED (erectile dysfunction) 02/08/2015  . Pulmonary nodules 02/08/2015  . Dilated aortic root (HCC) 02/08/2015  . Chest pain 08/24/2011  . Hyperlipidemia 08/24/2011  . Elevated BP 08/24/2011  . History of palpitations 12/16/2010   Past Medical History:  Diagnosis Date  . GERD (gastroesophageal reflux disease)   . Hyperlipidemia   . Palpitations    Family History  Adopted: Yes  Problem Relation Age of Onset  . Heart attack Paternal Grandfather    Past Surgical History:  Procedure Laterality Date  . bicep re-attachment    . NASAL SINUS SURGERY     Social History   Occupational History  . Occupation: Fireman    CommentLexicographer  Tobacco Use  . Smoking status: Never Smoker  . Smokeless tobacco: Never Used  Substance and Sexual Activity  . Alcohol use: No  . Drug use: No  . Sexual activity: Not on file

## 2020-05-19 ENCOUNTER — Ambulatory Visit: Payer: Self-pay

## 2020-05-19 ENCOUNTER — Ambulatory Visit: Payer: BC Managed Care – PPO | Admitting: Orthopaedic Surgery

## 2020-05-19 DIAGNOSIS — M25511 Pain in right shoulder: Secondary | ICD-10-CM

## 2020-05-19 MED ORDER — LIDOCAINE HCL 1 % IJ SOLN
3.0000 mL | INTRAMUSCULAR | Status: AC | PRN
  Administered 2020-05-19: 3 mL

## 2020-05-19 MED ORDER — METHYLPREDNISOLONE ACETATE 40 MG/ML IJ SUSP
40.0000 mg | INTRAMUSCULAR | Status: AC | PRN
  Administered 2020-05-19: 40 mg via INTRA_ARTICULAR

## 2020-05-19 NOTE — Progress Notes (Signed)
Office Visit Note   Patient: Jim Graham           Date of Birth: [birth year]           MRN: [id number] Visit Date: 05/19/2020              Requested by: Lupita Raider, MD [address] [place],  [place] [address] PCP: Lupita Raider, MD   Assessment & Plan: Visit Diagnoses:  1. Right shoulder pain, unspecified chronicity     Plan: I went over the patient's clinical exam and x-rays with him.  I have recommended a steroid injection in the right shoulder subacromial outlet and he agrees with this treatment plan.  I explained the risk and benefits of injections as well.  He is fully aware of what this involves having had it done before his left shoulder.  If this does not improve his symptoms, a MRI would be warranted to rule out a rotator cuff tear.  All question concerns were answered and addressed.  Follow-Up Instructions: Return in about 4 weeks (around 06/16/2020).   Orders:  Orders Placed This Encounter  Procedures  . Large Joint Inj  . XR Shoulder Right   No orders of the defined types were placed in this encounter.     Procedures: Large Joint Inj: R subacromial bursa on 05/19/2020 2:58 PM Indications: pain and diagnostic evaluation Details: 22 G 1.5 in needle  Arthrogram: No  Medications: 3 mL lidocaine 1 %; 40 mg methylPREDNISolone acetate 40 MG/ML Outcome: tolerated well, no immediate complications Procedure, treatment alternatives, risks and benefits explained, specific risks discussed. Consent was given by the patient. Immediately prior to procedure a time out was called to verify the correct patient, procedure, equipment, support staff and site/side marked as required. Patient was prepped and draped in the usual sterile fashion.       Clinical Data: No additional findings.   Subjective: Chief Complaint  Patient presents with  . Right Shoulder - Pain  The patient is somewhat of seen before actually performed left shoulder surgery on.  He  is right-hand dominant is been reporting a month and a half of worsening right shoulder pain from lifting weights.  He reports decreased motion of the right shoulder now with decreased strength and pain in general.  Is waking up at night.  He reports no numbness and tingling and only the pain is around the shoulder itself.  He says it is similar to when we had to address his left shoulder with surgery in 2018.  He has had no other acute change in medical status  HPI  Review of Systems He currently denies any headache, chest pain, shortness of breath, fever, chills, nausea, vomiting  Objective: Vital Signs: There were no vitals taken for this visit.  Physical Exam He is alert and oriented x3 and in no acute distress Ortho Exam Examination of his right shoulder shows positive Neer and Hawkins signs.  There is pain along the subacromial outlet as well.  The shoulder is clinically well located.  He does exhibit just a little bit of weakness in the rotator cuff. Specialty Comments:  No specialty comments available.  Imaging: No results found.   PMFS History: Patient Active Problem List   Diagnosis Date Noted  . History of arthroscopy of left shoulder 12/21/2016  . GERD (gastroesophageal reflux disease) 05/25/2015  . Agatston coronary artery calcium score less than 100 02/08/2015  . ED (erectile dysfunction) 02/08/2015  . Pulmonary nodules  02/08/2015  . Dilated aortic root (HCC) 02/08/2015  . Chest pain 08/24/2011  . Hyperlipidemia 08/24/2011  . Elevated BP 08/24/2011  . History of palpitations 12/16/2010   Past Medical History:  Diagnosis Date  . GERD (gastroesophageal reflux disease)   . Hyperlipidemia   . Palpitations     Family History  Adopted: Yes  Problem Relation Age of Onset  . Heart attack Paternal Grandfather     Past Surgical History:  Procedure Laterality Date  . bicep re-attachment    . NASAL SINUS SURGERY     Social History   Occupational History  .  Occupation: Fireman    CommentLexicographer  Tobacco Use  . Smoking status: Never Smoker  . Smokeless tobacco: Never Used  Substance and Sexual Activity  . Alcohol use: No  . Drug use: No  . Sexual activity: Not on file

## 2020-05-21 ENCOUNTER — Encounter: Payer: BC Managed Care – PPO | Admitting: Physical Medicine and Rehabilitation

## 2020-05-31 ENCOUNTER — Telehealth: Payer: Self-pay | Admitting: Physical Medicine and Rehabilitation

## 2020-05-31 NOTE — Telephone Encounter (Signed)
Patient want to reschedule appt with Dr. Alvester Morin the end of Sept. Patient stated recent injection is working for now. Please call patient about this matter at [phone].

## 2020-05-31 NOTE — Telephone Encounter (Signed)
Left message #1 to reschedule appointment. 

## 2020-06-01 ENCOUNTER — Encounter: Payer: BC Managed Care – PPO | Admitting: Physical Medicine and Rehabilitation

## 2020-06-11 NOTE — Telephone Encounter (Signed)
Patient states that he will call us next week to reschedule.

## 2020-06-17 ENCOUNTER — Encounter: Payer: Self-pay | Admitting: Physical Medicine and Rehabilitation

## 2020-06-17 MED ORDER — BUPIVACAINE HCL 0.25 % IJ SOLN
4.0000 mL | INTRAMUSCULAR | Status: AC | PRN
  Administered 2020-05-18: 4 mL via INTRA_ARTICULAR

## 2020-06-17 MED ORDER — TRIAMCINOLONE ACETONIDE 40 MG/ML IJ SUSP
60.0000 mg | INTRAMUSCULAR | Status: AC | PRN
  Administered 2020-05-18: 60 mg via INTRA_ARTICULAR

## 2020-07-12 ENCOUNTER — Encounter: Payer: Self-pay | Admitting: Orthopaedic Surgery

## 2020-07-12 ENCOUNTER — Ambulatory Visit: Payer: BC Managed Care – PPO | Admitting: Orthopaedic Surgery

## 2020-07-12 DIAGNOSIS — M25511 Pain in right shoulder: Secondary | ICD-10-CM | POA: Diagnosis not present

## 2020-07-12 NOTE — Progress Notes (Signed)
Patient comes in today with continued right shoulder pain.  We have tried and failed all forms of conservative treatment for that shoulder.  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  I provided a steroid injection in the subacromial space and he still having a lot of pain with overhead activities and reaching behind him.  He states this is similar to his left shoulder and I did perform arthroscopic surgery remotely on the left shoulder that is done very well.  He denies any numbness and tingling in his hand and denies any neck pain.  He does a lot of heavy overhead work and that is what bothers him the most.  He has had no other acute changes in medical status.  Examination of his right shoulder shows that is clinically well located.  He does have good strength of the rotator cuff with abduction but is external rotation is weaker than his nondominant shoulder.  His internal rotation with adduction is also limited in his liftoff is weak.  At this point a MRI is warranted of the right shoulder to rule out a rotator cuff tear.  We will see him back in 2 weeks to go over this MRI.  All question concerns were answered and addressed.

## 2020-07-13 ENCOUNTER — Other Ambulatory Visit: Payer: Self-pay

## 2020-07-13 DIAGNOSIS — M25511 Pain in right shoulder: Secondary | ICD-10-CM

## 2020-07-26 ENCOUNTER — Ambulatory Visit: Payer: BC Managed Care – PPO | Admitting: Orthopaedic Surgery

## 2020-08-05 ENCOUNTER — Other Ambulatory Visit: Payer: Self-pay

## 2020-08-05 ENCOUNTER — Ambulatory Visit
Admission: RE | Admit: 2020-08-05 | Discharge: 2020-08-05 | Disposition: A | Discharge status: Home / Self Care | Payer: BC Managed Care – PPO | Source: Ambulatory Visit | Attending: Orthopaedic Surgery | Admitting: Orthopaedic Surgery

## 2020-08-05 DIAGNOSIS — M25511 Pain in right shoulder: Secondary | ICD-10-CM

## 2020-08-09 ENCOUNTER — Ambulatory Visit: Payer: BC Managed Care – PPO | Admitting: Orthopaedic Surgery

## 2020-08-09 ENCOUNTER — Encounter: Payer: Self-pay | Admitting: Orthopaedic Surgery

## 2020-08-09 DIAGNOSIS — M7541 Impingement syndrome of right shoulder: Secondary | ICD-10-CM | POA: Diagnosis not present

## 2020-08-09 DIAGNOSIS — M75111 Incomplete rotator cuff tear or rupture of right shoulder, not specified as traumatic: Secondary | ICD-10-CM | POA: Diagnosis not present

## 2020-08-09 DIAGNOSIS — M25511 Pain in right shoulder: Secondary | ICD-10-CM | POA: Diagnosis not present

## 2020-08-09 NOTE — Progress Notes (Signed)
The patient is here today to go over an MRI of his right shoulder.  He has a previous history of left shoulder surgery.  Has been having pain with overhead activities with the right shoulder as well as a positive Hawkins sign.  He had definitely extensive signs of impingement of the shoulder but some weakness as well.  He had tried and failed conservative treatment including activity modification, therapy and steroid injections.  The MRI is reviewed with him.  It does show a partial to near full-thickness rotator cuff tear.  There is moderate to severe AC joint arthritic changes.  There is tendinosis of the rotator cuff and some degenerative labral tearing.  His right shoulder does show pain and weakness on exam at the subacromial outlet and the [facility] joint.  At this point I have recommended arthroscopic intervention of his right shoulder.  Having had this before in his left shoulder he is fully aware of what the surgery involves.  I described the surgery in detail.  I have let him know that we would certainly repair the rotator cuff depending on the nature of what we find interoperative.  We will also perform a subacromial decompression and a distal clavicle resection and extensive debridement.  The risk and benefits of surgery were discussed in detail.  I described his interoperative and postoperative course.  He does wish to have this scheduled in the near future.  We would then see him back at a regular postoperative visit 1 week after surgery.  All questions and concerns were answered and addressed.

## 2020-08-26 ENCOUNTER — Other Ambulatory Visit: Payer: Self-pay | Admitting: Orthopaedic Surgery

## 2020-08-26 DIAGNOSIS — M7541 Impingement syndrome of right shoulder: Secondary | ICD-10-CM | POA: Diagnosis not present

## 2020-08-26 HISTORY — PX: SHOULDER ARTHROSCOPY: SHX128

## 2020-08-26 MED ORDER — OXYCODONE-ACETAMINOPHEN 5-325 MG PO TABS: 1.0000 | ORAL_TABLET | ORAL | 0 refills | Status: DC | PRN

## 2020-09-02 ENCOUNTER — Ambulatory Visit (INDEPENDENT_AMBULATORY_CARE_PROVIDER_SITE_OTHER): Payer: BC Managed Care – PPO | Admitting: Orthopaedic Surgery

## 2020-09-02 ENCOUNTER — Encounter: Payer: Self-pay | Admitting: Orthopaedic Surgery

## 2020-09-02 ENCOUNTER — Inpatient Hospital Stay: Payer: BC Managed Care – PPO | Admitting: Physician Assistant

## 2020-09-02 DIAGNOSIS — Z9889 Other specified postprocedural states: Secondary | ICD-10-CM

## 2020-09-02 DIAGNOSIS — M7541 Impingement syndrome of right shoulder: Secondary | ICD-10-CM

## 2020-09-02 DIAGNOSIS — M75111 Incomplete rotator cuff tear or rupture of right shoulder, not specified as traumatic: Secondary | ICD-10-CM

## 2020-09-02 NOTE — Progress Notes (Signed)
The patient is 1 week status post a right shoulder arthroscopy with subacromial decompression and extensive debridement.  His rotator cuff was intact.  He had significant inflammation and synovitis in the shoulder as well as a very tight subacromial outlet.  He has been in a sling.  He says he has been doing well.  On exam we removed all the sutures from the right shoulder.  He has significant bruising.  His axillary nerve function is intact.  I showed him the arthroscopy pictures and a shoulder model explaining in detail what we did for his shoulder.  All of them to take it easy for the next 8 weeks as he rehabilitates the shoulder.  I will give him a note for work reflecting this as well for light duty only.  I would like to see him back in just 3 weeks to determine whether or not he needs outpatient physical therapy.  I did show him some things to try in the interim.  All questions and concerns were answered and addressed.

## 2020-09-23 ENCOUNTER — Encounter: Payer: Self-pay | Admitting: Orthopaedic Surgery

## 2020-09-23 ENCOUNTER — Ambulatory Visit (INDEPENDENT_AMBULATORY_CARE_PROVIDER_SITE_OTHER): Payer: BC Managed Care – PPO | Admitting: Orthopaedic Surgery

## 2020-09-23 DIAGNOSIS — M7541 Impingement syndrome of right shoulder: Secondary | ICD-10-CM

## 2020-09-23 DIAGNOSIS — Z9889 Other specified postprocedural states: Secondary | ICD-10-CM

## 2020-09-23 NOTE — Progress Notes (Signed)
The patient is now 1 month status post a right shoulder arthroscopy with extensive subacromial decompression and debridement.  The rotator cuff was intact.  He is working on try to get his shoulder moving on his own.  He does have Celebrex at home and asked about taking this.  I recommended he start taking it twice a day.  Based on exam today he has significant weakness in that rotator cuff and his pain and stiffness.  He still wants to proceed with his own exercise program.  I did recommend a steroid injection today in the subacromial outlet and he agreed to this and tolerated it well.  He will take Celebrex twice daily and I like to see him back in 4 weeks to see how he is doing overall.

## 2020-10-19 ENCOUNTER — Telehealth: Payer: Self-pay | Admitting: Orthopaedic Surgery

## 2020-10-19 NOTE — Telephone Encounter (Signed)
Called pt left vm to reschedule appt with Dr. Magnus Ivan.

## 2020-10-20 ENCOUNTER — Ambulatory Visit: Payer: BC Managed Care – PPO | Admitting: Orthopaedic Surgery

## 2020-11-01 ENCOUNTER — Ambulatory Visit: Payer: BC Managed Care – PPO | Admitting: Orthopaedic Surgery

## 2020-11-25 ENCOUNTER — Ambulatory Visit (INDEPENDENT_AMBULATORY_CARE_PROVIDER_SITE_OTHER): Payer: BC Managed Care – PPO | Admitting: Orthopaedic Surgery

## 2020-11-25 ENCOUNTER — Encounter: Payer: Self-pay | Admitting: Orthopaedic Surgery

## 2020-11-25 DIAGNOSIS — Z9889 Other specified postprocedural states: Secondary | ICD-10-CM

## 2020-11-25 NOTE — Progress Notes (Signed)
The patient is about 50-month status post a right shoulder arthroscopy with extensive debridement and subacromial decompression. His rotator cuff was intact and there was only partial tearing. There was significant inflammation of that shoulder. We actually performed arthroscopic surgery with rotator cuff repair on his left shoulder 3 years ago and he had a very quick recovery from that shoulder surgery. He has had a much slower recovery with this shoulder. Back in December at his last visit I did place a steroid injection in the subacromial outlet. He has been working on therapy as well with that shoulder.  Examination of his right shoulder still shows weakness with abduction and using more of his deltoids abduct his shoulder. It is still having some pain in that shoulder as well on his exam.  He should continue to work on range of motion of that shoulder and subtle strengthening. There is nothing else that I recommend for now other than giving this time. I would like to see him back in 3 months for repeat exam. All question concerns were answered and addressed.

## 2021-01-23 ENCOUNTER — Other Ambulatory Visit: Payer: Self-pay | Admitting: Physical Medicine and Rehabilitation

## 2021-01-24 NOTE — Telephone Encounter (Signed)
Please advise 

## 2021-02-23 ENCOUNTER — Ambulatory Visit: Payer: BC Managed Care – PPO | Admitting: Orthopaedic Surgery

## 2021-08-03 ENCOUNTER — Ambulatory Visit (INDEPENDENT_AMBULATORY_CARE_PROVIDER_SITE_OTHER): Payer: BC Managed Care – PPO | Admitting: Orthopaedic Surgery

## 2021-08-03 ENCOUNTER — Encounter: Payer: Self-pay | Admitting: Orthopaedic Surgery

## 2021-08-03 ENCOUNTER — Other Ambulatory Visit: Payer: Self-pay

## 2021-08-03 DIAGNOSIS — M7541 Impingement syndrome of right shoulder: Secondary | ICD-10-CM

## 2021-08-03 DIAGNOSIS — M25511 Pain in right shoulder: Secondary | ICD-10-CM | POA: Diagnosis not present

## 2021-08-03 DIAGNOSIS — Z9889 Other specified postprocedural states: Secondary | ICD-10-CM | POA: Diagnosis not present

## 2021-08-03 MED ORDER — METHYLPREDNISOLONE ACETATE 40 MG/ML IJ SUSP
40.0000 mg | INTRAMUSCULAR | Status: AC | PRN
  Administered 2021-08-03: 40 mg via INTRA_ARTICULAR

## 2021-08-03 MED ORDER — LIDOCAINE HCL 1 % IJ SOLN
3.0000 mL | INTRAMUSCULAR | Status: AC | PRN
  Administered 2021-08-03: 3 mL

## 2021-08-03 NOTE — Progress Notes (Signed)
Office Visit Note   Patient: Jim Graham           Date of Birth: 1956/05/16           MRN: [id number] Visit Date: 08/03/2021              Requested by: Lupita Raider, MD [address] [place],  [place] [address] PCP: Lupita Raider, MD   Assessment & Plan: Visit Diagnoses:  1. Status post arthroscopy of right shoulder   2. Right shoulder pain, unspecified chronicity   3. Impingement syndrome of right shoulder     Plan: I did provide a steroid injection in his right shoulder subacromial outlet.  I would like to send him to Dr. Alvester Morin for a deep intra-articular injection in the glenohumeral joint under fluoroscopy with a steroid.  I will then see him back myself in about 4 weeks to see how he is doing overall.  He did tolerate the steroid injection I provided today.  We will work on setting him up for a right shoulder glenohumeral injection under fluoroscopy.  Follow-Up Instructions: Return in about 4 weeks (around 08/31/2021).   Orders:  Orders Placed This Encounter  Procedures   Large Joint Inj    No orders of the defined types were placed in this encounter.     Procedures: Large Joint Inj: R subacromial bursa on 08/03/2021 5:25 PM Indications: pain and diagnostic evaluation Details: 22 G 1.5 in needle  Arthrogram: No  Medications: 3 mL lidocaine 1 %; 40 mg methylPREDNISolone acetate 40 MG/ML Outcome: tolerated well, no immediate complications Procedure, treatment alternatives, risks and benefits explained, specific risks discussed. Consent was given by the patient. Immediately prior to procedure a time out was called to verify the correct patient, procedure, equipment, support staff and site/side marked as required. Patient was prepped and draped in the usual sterile fashion.      Clinical Data: No additional findings.   Subjective: Chief Complaint  Patient presents with   Right Shoulder - Pain  The patient is well-known to me.  He is  getting close to a year out from right shoulder arthroscopy with subacromial decompression and significant debridement.  His rotator cuff was completely intact.  He had significant inflamed tissue in the glenohumeral joint and the subacromial outlet.  Since then he is still having some pain with the shoulder especially with lifting above his head and when he works out.  It becomes a constant pain.  I was able to pull up his arthroscopy pictures from the time of his surgery.  There was no evidence of rotator cuff tear at all but significant impingement.  We performed a debridement of the rotator interval and there was significant inflamed tissue all throughout the glenohumeral joint.  The cartilage was intact on the humeral head and the glenoid face.  The labrum was intact and the rotator cuff was intact.  There was significant decrease in the space of the subacromial outlet.  HPI  Review of Systems There is currently listed no headache, chest pain, shortness of breath, fever, chills, nausea, vomiting  Objective: Vital Signs: There were no vitals taken for this visit.  Physical Exam He is alert and orient x3 and in no acute distress Ortho Exam Examination of his right shoulder does show pain throughout the arc of motion of the shoulder but no weakness at all.  There is no blocks to rotation either. Specialty Comments:  No specialty comments available.  Imaging: No  results found.   PMFS History: Patient Active Problem List   Diagnosis Date Noted   History of arthroscopy of left shoulder 12/21/2016   GERD (gastroesophageal reflux disease) 05/25/2015   Agatston coronary artery calcium score less than 100 02/08/2015   ED (erectile dysfunction) 02/08/2015   Pulmonary nodules 02/08/2015   Dilated aortic root (HCC) 02/08/2015   Chest pain 08/24/2011   Hyperlipidemia 08/24/2011   Elevated BP 08/24/2011   History of palpitations 12/16/2010   Past Medical History:  Diagnosis Date   GERD  (gastroesophageal reflux disease)    Hyperlipidemia    Palpitations     Family History  Adopted: Yes  Problem Relation Age of Onset   Heart attack Paternal Grandfather     Past Surgical History:  Procedure Laterality Date   bicep re-attachment     NASAL SINUS SURGERY     Social History   Occupational History   Occupation: Company secretary    Comment: Fish farm manager  Tobacco Use   Smoking status: Never   Smokeless tobacco: Never  Substance and Sexual Activity   Alcohol use: No   Drug use: No   Sexual activity: Not on file

## 2021-08-04 ENCOUNTER — Other Ambulatory Visit: Payer: Self-pay

## 2021-08-04 DIAGNOSIS — M25511 Pain in right shoulder: Secondary | ICD-10-CM

## 2021-08-04 DIAGNOSIS — Z9889 Other specified postprocedural states: Secondary | ICD-10-CM

## 2021-08-18 ENCOUNTER — Ambulatory Visit: Payer: BC Managed Care – PPO | Admitting: Physical Medicine and Rehabilitation

## 2021-08-18 ENCOUNTER — Other Ambulatory Visit: Payer: Self-pay

## 2021-08-18 ENCOUNTER — Encounter: Payer: Self-pay | Admitting: Physical Medicine and Rehabilitation

## 2021-08-18 ENCOUNTER — Ambulatory Visit: Payer: Self-pay

## 2021-08-18 DIAGNOSIS — G8929 Other chronic pain: Secondary | ICD-10-CM | POA: Diagnosis not present

## 2021-08-18 DIAGNOSIS — M25511 Pain in right shoulder: Secondary | ICD-10-CM | POA: Diagnosis not present

## 2021-08-18 NOTE — Progress Notes (Signed)
Pt state right shoulder pain. Pt state any uses of his right arm makes the pain worse. Pt state he takes pain meds to help ease his pain.  Numeric Pain Rating Scale and Functional Assessment Average Pain 3   In the last MONTH (on 0-10 scale) has pain interfered with the following?  1. General activity like being  able to carry out your everyday physical activities such as walking, climbing stairs, carrying groceries, or moving a chair?  Rating(4)   -BT, -Dye Allergies.

## 2021-08-19 MED ORDER — BUPIVACAINE HCL 0.5 % IJ SOLN
3.0000 mL | INTRAMUSCULAR | Status: AC | PRN
  Administered 2021-08-18: 3 mL via INTRA_ARTICULAR

## 2021-08-19 MED ORDER — BUPIVACAINE HCL 0.25 % IJ SOLN
5.0000 mL | INTRAMUSCULAR | Status: AC | PRN
  Administered 2021-08-18: 5 mL via INTRA_ARTICULAR

## 2021-08-19 MED ORDER — TRIAMCINOLONE ACETONIDE 40 MG/ML IJ SUSP
40.0000 mg | INTRAMUSCULAR | Status: AC | PRN
  Administered 2021-08-18: 40 mg via INTRA_ARTICULAR

## 2021-08-19 NOTE — Progress Notes (Signed)
Jim Graham - 65 y.o. male MRN [id number]  Date of birth: 12/05/1955  Office Visit Note: Visit Date: 08/18/2021 PCP: Lupita Raider, MD Referred by: Lupita Raider, MD  Subjective: Chief Complaint  Patient presents with   Right Shoulder - Pain   HPI:  Jim Graham is a 65 y.o. male who comes in today at the request of Dr. Doneen Poisson for planned Right anesthetic glenohumeral arthrogram with fluoroscopic guidance.  The patient has failed conservative care including home exercise, medications, time and activity modification.  This injection will be diagnostic and hopefully therapeutic.  Please see requesting physician notes for further details and justification.   ROS Otherwise per HPI.  Assessment & Plan: Visit Diagnoses:    ICD-10-CM   1. Chronic right shoulder pain  M25.511 XR C-ARM NO REPORT   G89.29       Plan: No additional findings.   Meds & Orders: No orders of the defined types were placed in this encounter.   Orders Placed This Encounter  Procedures   Large Joint Inj   XR C-ARM NO REPORT    Follow-up: Return for visit to requesting physician as needed.   Procedures: Large Joint Inj: R glenohumeral on 08/18/2021 4:00 PM Indications: pain and diagnostic evaluation Details: 22 G 3.5 in needle, fluoroscopy-guided anteromedial approach  Arthrogram: No  Medications: 3 mL bupivacaine 0.5 %; 40 mg triamcinolone acetonide 40 MG/ML; 5 mL bupivacaine 0.25 % Outcome: tolerated well, no immediate complications  There was excellent flow of contrast producing a partial arthrogram of the glenohumeral joint. The patient did have relief of symptoms during the anesthetic phase of the injection.  He still has significant reduced range of motion. Procedure, treatment alternatives, risks and benefits explained, specific risks discussed. Consent was given by the patient. Immediately prior to procedure a time out was called to verify the correct patient, procedure,  equipment, support staff and site/side marked as required. Patient was prepped and draped in the usual sterile fashion.         Clinical History: MRI LUMBAR SPINE WITHOUT CONTRAST   TECHNIQUE: Multiplanar, multisequence MR imaging of the lumbar spine was performed. No intravenous contrast was administered.   COMPARISON:  04/17/2012   FINDINGS: Segmentation: 5 lumbar type vertebral bodies based on the lowest ribs   Alignment:  Physiologic.   Vertebrae: Edematous and fatty signal on both sides of the collapsed L5-S1 disc space, chronic but progressive from 2013 and considered degenerative. No fracture or aggressive bone lesion   Conus medullaris and cauda equina: Conus extends to the L1 level. Conus and cauda equina appear normal.   Paraspinal and other soft tissues: Negative   Disc levels:   L2-L3: Minor disc narrowing and bulging   L3-L4: Mild disc narrowing and bulging with small endplate spurs.   L4-L5: Disc narrowing and bulging. Posterior element hypertrophy. Moderate to advanced spinal stenosis. Both foramina are patent   L5-S1:Severe disc degeneration with collapse, bulge, and endplate ridging. Biforaminal impingement, particularly advanced on the left   IMPRESSION: 1. Disc degeneration that is focally advanced at L5-S1 where disc collapse and ridging causes biforaminal impingement, worse on the left. 2. L4-5 moderate to advanced degenerative spinal stenosis.     Electronically Signed   By: Marnee Spring M.D.   On: 08/13/2018 08:59     Objective:  VS:  HT:    WT:   BMI:     BP:   HR: bpm  TEMP: ( )  RESP:  Physical  Exam Musculoskeletal:     Comments: Right shoulder exhibits decreased range of motion with abduction and forward flexion with increased pain at that level.     Imaging: XR C-ARM NO REPORT  Result Date: 08/18/2021 Please see Notes tab for imaging impression.

## 2021-08-31 ENCOUNTER — Ambulatory Visit: Payer: BC Managed Care – PPO | Admitting: Orthopaedic Surgery

## 2021-08-31 ENCOUNTER — Other Ambulatory Visit: Payer: Self-pay

## 2021-08-31 ENCOUNTER — Encounter: Payer: Self-pay | Admitting: Orthopaedic Surgery

## 2021-08-31 DIAGNOSIS — M25511 Pain in right shoulder: Secondary | ICD-10-CM

## 2021-08-31 DIAGNOSIS — Z96611 Presence of right artificial shoulder joint: Secondary | ICD-10-CM

## 2021-08-31 DIAGNOSIS — G8929 Other chronic pain: Secondary | ICD-10-CM

## 2021-08-31 NOTE — Progress Notes (Signed)
The patient is now a year out from arthroscopic intervention of the right shoulder.  We found significant inflamed tissue in the glenohumeral joint and the subacromial outlet.  The rotator cuff is intact.  He is a very active and very strong 65 year old gentleman.  Since then he has been through extensive physical therapy for his right shoulder.  He has had anti-inflammatories.  He has had intra-articular steroid injections in the shoulder joint on the right side and subacromial steroid injections.  He is still having significant pain in the right shoulder and the injections have only helped for short amount of time.  On exam he still has a lot of guarding with his right shoulder and pain with reaching overhead and behind.  There is now some slight weakness that he did not have before.  Of note he is also been through extensive outpatient physical therapy for the right shoulder.  At this point I would like to obtain an MRI of the right shoulder to assess for an internal derangement such as arthropathy of the [facility] joint or rotator cuff tear based on his clinical exam findings and the failure of conservative treatment.  He agrees with this treatment plan as well.  We will see him back in follow-up once we have the MRI of the right shoulder.  All questions and concerns were answered and addressed.

## 2021-09-19 ENCOUNTER — Ambulatory Visit
Admission: RE | Admit: 2021-09-19 | Discharge: 2021-09-19 | Disposition: A | Discharge status: Home / Self Care | Payer: BC Managed Care – PPO | Source: Ambulatory Visit | Attending: Orthopaedic Surgery | Admitting: Orthopaedic Surgery

## 2021-09-19 ENCOUNTER — Other Ambulatory Visit: Payer: Self-pay

## 2021-09-19 DIAGNOSIS — Z96611 Presence of right artificial shoulder joint: Secondary | ICD-10-CM

## 2021-09-21 ENCOUNTER — Telehealth: Payer: Self-pay | Admitting: Orthopedic Surgery

## 2021-09-21 ENCOUNTER — Ambulatory Visit: Payer: BC Managed Care – PPO | Admitting: Orthopaedic Surgery

## 2021-09-21 ENCOUNTER — Encounter: Payer: Self-pay | Admitting: Orthopaedic Surgery

## 2021-09-21 DIAGNOSIS — G8929 Other chronic pain: Secondary | ICD-10-CM

## 2021-09-21 DIAGNOSIS — M25511 Pain in right shoulder: Secondary | ICD-10-CM

## 2021-09-21 NOTE — Progress Notes (Signed)
The patient comes in today to go over MRI of his right shoulder.  This is the shoulder that I have performed arthroscopic intervention with extensive debridement and subacromial decompression.  Even at the time he had a partial-thickness rotator cuff tear but even looking at my arthroscopy pictures this did not need to be debrided until full tear and a repair at the time.  I did not address any AC joint issues but it did not seem to be an issue at the time.  He is very athletic and likes to play throwing sports and work out quite a bit.  He still been dealing with significant right shoulder pain we have tried and failed conservative treatment for many months now including activity modification and therapy as well as steroid injections.  The repeat MRI of his right shoulder still shows moderate tendinosis of the rotator cuff of the supraspinatus tendon as well as a partial-thickness tear.  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  There is a small amount of cartilage wear at the superior aspect of his humeral head.  At this point I would like to send him as a second opinion to my partner Dr. August Saucer and will defer any further surgical management to Dr. August Saucer.  I do feel like he needs a repeat surgical intervention of the right shoulder and the patient actually agrees with this as well and said he is not worried about going back to the OR for his shoulder he just wants to feel better overall.  We will work on getting an appointment with Dr. August Saucer soon.  He is a very reasonable patient of mine.

## 2021-09-21 NOTE — Telephone Encounter (Signed)
Pt had an appt today with Dr. Magnus Ivan and is asking if he can be worked in any sooner for Dr. August Saucer since he is being referred from Dr. Magnus Ivan. As of right now he only has openings after the first of the years and pt stated Dr. Magnus Ivan said to send a message back and see if there are any openings we can get for him. The best call back number is [phone].

## 2021-09-22 NOTE — Telephone Encounter (Signed)
IC appointment scheduled.

## 2021-10-05 ENCOUNTER — Ambulatory Visit: Payer: BC Managed Care – PPO | Admitting: Orthopedic Surgery

## 2021-10-05 ENCOUNTER — Encounter: Payer: Self-pay | Admitting: Orthopedic Surgery

## 2021-10-05 ENCOUNTER — Other Ambulatory Visit: Payer: Self-pay

## 2021-10-05 DIAGNOSIS — M7521 Bicipital tendinitis, right shoulder: Secondary | ICD-10-CM

## 2021-10-10 ENCOUNTER — Encounter: Payer: Self-pay | Admitting: Orthopedic Surgery

## 2021-10-10 NOTE — Progress Notes (Signed)
Office Visit Note   Patient: BERNICE MULLIN           Date of Birth: 1956-05-16           MRN: [id number] Visit Date: 10/05/2021 Requested by: Lupita Raider, MD [address] [place],  [place] [address] PCP: Lupita Raider, MD  Subjective: Chief Complaint  Patient presents with   Right Shoulder - Follow-up    HPI: Valerio is a 65 year old patient with right shoulder pain.  Had previous right shoulder arthroscopy with subacromial decompression and extensive debridement.  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  He does light weights.  Also likes to do push-ups.  Reports pain as well as catching with forward flexion and localizes his symptoms in the anterior aspect of the shoulder.  Had an injection in the right shoulder recently which only gave him 1 day of relief.  Currently taking no pains daily for symptoms.  He is very physically active.  The injection with Dr. Alvester Morin 08/18/2001 gave no relief of symptoms.  MRI scan of the right shoulder from 09/19/2021 is reviewed with the patient.  MRI report states some cartilage loss within the glenohumeral joint along with biceps tendinosis and supraspinatus tendinosis with partial-thickness tear.  However, when I reviewed the scan with the patient it is apparent that the biceps tendon is subluxated out of the bicipital groove which can be seen on both the axial as well as coronal views.  I think this more adequately explains his symptoms.              ROS: All systems reviewed are negative as they relate to the chief complaint within the history of present illness.  Patient denies  fevers or chills.   Assessment & Plan: Visit Diagnoses:  1. Biceps tendinitis of right shoulder     Plan: Impression is age-appropriate degenerative changes within that glenohumeral joint and rotator cuff in an active patient.  However he does have progressive biceps subluxation with attenuation of the transverse humeral ligament seen on the axial and  coronal views on most recent MRI scan.  This does appear to be progressive compared to prior imaging studies.  Plan at this time would be arthroscopy with evaluation of the rotator cuff and biceps tendon release with debridement and biceps tenodesis.  The upper portion of the subscapularis does not appear to be involved with the biceps subluxation but I think this is more problem of the transverse humeral ligament.  Risk and benefits of the procedure are discussed including not limited to infection nerve vessel damage incomplete pain relief as well as potential for incomplete restoration of function.  We may also have to address the rotator cuff tear which is partial-thickness on MRI scan but may be bigger in person.  Patient understands the risk and benefits as well as the extensive rehabilitative process involved.  All questions answered  Follow-Up Instructions: No follow-ups on file.   Orders:  No orders of the defined types were placed in this encounter.  No orders of the defined types were placed in this encounter.     Procedures: No procedures performed   Clinical Data: No additional findings.  Objective: Vital Signs: There were no vitals taken for this visit.  Physical Exam:   Constitutional: Patient appears well-developed HEENT:  Head: Normocephalic Eyes:EOM are normal Neck: Normal range of motion Cardiovascular: Normal rate Pulmonary/chest: Effort normal Neurologic: Patient is alert Skin: Skin is warm Psychiatric: Patient has normal mood and  affect   Ortho Exam: Ortho exam demonstrates pretty reasonable rotator cuff strength infraspinatus supraspinatus absent muscle testing on the right left-hand side.  Passive range of motion on the right is 50/90/170.  No discrete AC joint tenderness right versus left.  No coarse grinding or crepitus with internal or external rotation on the right-hand side.  O'Brien's testing equivocal on the right negative on the left.  No Popeye  deformity.  Specialty Comments:  No specialty comments available.  Imaging: No results found.   PMFS History: Patient Active Problem List   Diagnosis Date Noted   Chronic right shoulder pain 08/31/2021   History of arthroscopy of left shoulder 12/21/2016   GERD (gastroesophageal reflux disease) 05/25/2015   Agatston coronary artery calcium score less than 100 02/08/2015   ED (erectile dysfunction) 02/08/2015   Pulmonary nodules 02/08/2015   Dilated aortic root (HCC) 02/08/2015   Chest pain 08/24/2011   Hyperlipidemia 08/24/2011   Elevated BP 08/24/2011   History of palpitations 12/16/2010   Past Medical History:  Diagnosis Date   GERD (gastroesophageal reflux disease)    Hyperlipidemia    Palpitations     Family History  Adopted: Yes  Problem Relation Age of Onset   Heart attack Paternal Grandfather     Past Surgical History:  Procedure Laterality Date   bicep re-attachment     NASAL SINUS SURGERY     Social History   Occupational History   Occupation: Company secretary    Comment: Fish farm manager  Tobacco Use   Smoking status: Never   Smokeless tobacco: Never  Substance and Sexual Activity   Alcohol use: No   Drug use: No   Sexual activity: Not on file

## 2021-10-11 NOTE — Pre-Procedure Instructions (Signed)
Surgical Instructions    Your procedure is scheduled on Tuesday, January 3rd.  Report to [facility] Main Entrance "A" at 09:30 A.M., then check in with the Admitting office.  Call this number if you have problems the morning of surgery:  [phone]   If you have any questions prior to your surgery date call [phone]: Open Monday-Friday 8am-4pm    Remember:  Do not eat after midnight the night before your surgery  You may drink clear liquids until 08:30 AM the morning of your surgery.   Clear liquids allowed are: Water, Non-Citrus Juices (without pulp), Carbonated Beverages, Clear Tea, Black Coffee Only, and Gatorade    Take these medicines the morning of surgery with A SIP OF WATER  rosuvastatin (CRESTOR) valACYclovir (VALTREX)   As of today, STOP taking any Aspirin (unless otherwise instructed by your surgeon) Aleve, Naproxen, Ibuprofen, Motrin, Advil, Goody's, BC's, all herbal medications, fish oil, and all vitamins. This includes your celecoxib (CELEBREX).                     Do NOT Smoke (Tobacco/Vaping) or drink Alcohol 24 hours prior to your procedure.  If you use a CPAP at night, you may bring all equipment for your overnight stay.   Contacts, glasses, piercing's, hearing aid's, dentures or partials may not be worn into surgery, please bring cases for these belongings.    For patients admitted to the hospital, discharge time will be determined by your treatment team.   Patients discharged the day of surgery will not be allowed to drive home, and someone needs to stay with them for 24 hours.  NO VISITORS WILL BE ALLOWED IN PRE-OP WHERE PATIENTS GET READY FOR SURGERY.  ONLY 1 SUPPORT PERSON MAY BE PRESENT IN THE WAITING ROOM WHILE YOU ARE IN SURGERY.  IF YOU ARE TO BE ADMITTED, ONCE YOU ARE IN YOUR ROOM YOU WILL BE ALLOWED TWO (2) VISITORS.  Minor children may have two parents present. Special consideration for safety and communication needs will be reviewed on a case by  case basis.   Special instructions:   [place]- Preparing For Surgery  Before surgery, you can play an important role. Because skin is not sterile, your skin needs to be as free of germs as possible. You can reduce the number of germs on your skin by washing with CHG (chlorahexidine gluconate) Soap before surgery.  CHG is an antiseptic cleaner which kills germs and bonds with the skin to continue killing germs even after washing.    Oral Hygiene is also important to reduce your risk of infection.  Remember - BRUSH YOUR TEETH THE MORNING OF SURGERY WITH YOUR REGULAR TOOTHPASTE  Please do not use if you have an allergy to CHG or antibacterial soaps. If your skin becomes reddened/irritated stop using the CHG.  Do not shave (including legs and underarms) for at least 48 hours prior to first CHG shower. It is OK to shave your face.  Please follow these instructions carefully.   Shower the NIGHT BEFORE SURGERY and the MORNING OF SURGERY  If you chose to wash your hair, wash your hair first as usual with your normal shampoo.  After you shampoo, rinse your hair and body thoroughly to remove the shampoo.  Use CHG Soap as you would any other liquid soap. You can apply CHG directly to the skin and wash gently with a scrungie or a clean washcloth.   Apply the CHG Soap to your body ONLY FROM  THE NECK DOWN.  Do not use on open wounds or open sores. Avoid contact with your eyes, ears, mouth and genitals (private parts). Wash Face and genitals (private parts)  with your normal soap.   Wash thoroughly, paying special attention to the area where your surgery will be performed.  Thoroughly rinse your body with warm water from the neck down.  DO NOT shower/wash with your normal soap after using and rinsing off the CHG Soap.  Pat yourself dry with a CLEAN TOWEL.  Wear CLEAN PAJAMAS to bed the night before surgery  Place CLEAN SHEETS on your bed the night before your surgery  DO NOT SLEEP WITH  PETS.   Day of Surgery: Shower with CHG soap. Do not wear jewelry Do not wear lotions, powders, colognes, or deodorant. Men may shave face and neck. Do not bring valuables to the hospital. [facility] is not responsible for any belongings or valuables. Wear Clean/Comfortable clothing the morning of surgery Remember to brush your teeth WITH YOUR REGULAR TOOTHPASTE.   Please read over the following fact sheets that you were given.   3 days prior to your procedure or After your COVID test   You are not required to quarantine however you are required to wear a well-fitting mask when you are out and around people not in your household. If your mask becomes wet or soiled, replace with a new one.   Wash your hands often with soap and water for 20 seconds or clean your hands with an alcohol-based hand sanitizer that contains at least 60% alcohol.   Do not share personal items.   Notify your provider:  o if you are in close contact with someone who has COVID  o or if you develop a fever of 100.4 or greater, sneezing, cough, sore throat, shortness of breath or body aches.

## 2021-10-12 ENCOUNTER — Other Ambulatory Visit: Payer: Self-pay

## 2021-10-12 ENCOUNTER — Encounter (HOSPITAL_COMMUNITY): Payer: Self-pay

## 2021-10-12 ENCOUNTER — Encounter (HOSPITAL_COMMUNITY)
Admission: RE | Admit: 2021-10-12 | Discharge: 2021-10-12 | Disposition: A | Discharge status: Home / Self Care | Payer: BC Managed Care – PPO | Source: Ambulatory Visit | Attending: Orthopedic Surgery | Admitting: Orthopedic Surgery

## 2021-10-12 VITALS — BP 150/90 | HR 90 | Temp 98.4°F | Resp 18 | Ht 70.0 in | Wt 225.6 lb

## 2021-10-12 DIAGNOSIS — Z01818 Encounter for other preprocedural examination: Secondary | ICD-10-CM | POA: Diagnosis not present

## 2021-10-12 DIAGNOSIS — Z87898 Personal history of other specified conditions: Secondary | ICD-10-CM

## 2021-10-12 LAB — BASIC METABOLIC PANEL
Anion gap: 9 (ref 5–15)
BUN: 23 mg/dL (ref 8–23)
CO2: 27 mmol/L (ref 22–32)
Calcium: 9.3 mg/dL (ref 8.9–10.3)
Chloride: 101 mmol/L (ref 98–111)
Creatinine, Ser: 1.07 mg/dL (ref 0.61–1.24)
GFR, Estimated: >60 mL/min (ref >60)
Glucose, Bld: 104 mg/dL — ABNORMAL HIGH (ref 70–99)
Potassium: 4 mmol/L (ref 3.5–5.1)
Sodium: 137 mmol/L (ref 135–145)

## 2021-10-12 LAB — CBC
HCT: 45.9 % (ref 39.0–52.0)
Hemoglobin: 16 g/dL (ref 13.0–17.0)
MCH: 32.1 pg (ref 26.0–34.0)
MCHC: 34.9 g/dL (ref 30.0–36.0)
MCV: 92 fL (ref 80.0–100.0)
Platelets: 225 10*3/uL (ref 150–400)
RBC: 4.99 MIL/uL (ref 4.22–5.81)
RDW: 13 % (ref 11.5–15.5)
WBC: 6.3 10*3/uL (ref 4.0–10.5)
nRBC: 0 % (ref 0.0–0.2)

## 2021-10-12 NOTE — Progress Notes (Signed)
PCP - Dr. Lupita Raider Cardiologist - Pt saw Dr. Zoila Shutter in 2021 but does not see cardiology regularly  PPM/ICD - denies   Chest x-ray - 07/01/12 EKG - 10/12/21 at PAT Stress Test - 09/11/11 ECHO - 09/11/11 Cardiac Cath - denies  Sleep Study - denies   DM- denies  Blood Thinner Instructions: n/a Aspirin Instructions: n/a  ERAS Protcol - yes, no drink   COVID TEST- n/a, ambulatory surgery   Anesthesia review: no  Patient denies shortness of breath, fever, cough and chest pain at PAT appointment   All instructions explained to the patient, with a verbal understanding of the material. Patient agrees to go over the instructions while at home for a better understanding. Patient also instructed to wear a mask in public for 3 days prior to surgery. The opportunity to ask questions was provided.

## 2021-10-13 ENCOUNTER — Other Ambulatory Visit: Payer: Self-pay | Admitting: Physical Medicine and Rehabilitation

## 2021-10-14 NOTE — Telephone Encounter (Signed)
Pls advise.  

## 2021-10-18 ENCOUNTER — Ambulatory Visit (HOSPITAL_COMMUNITY): Payer: BC Managed Care – PPO | Admitting: Physician Assistant

## 2021-10-18 ENCOUNTER — Encounter (HOSPITAL_COMMUNITY): Payer: Self-pay | Admitting: Orthopedic Surgery

## 2021-10-18 ENCOUNTER — Other Ambulatory Visit: Payer: Self-pay

## 2021-10-18 ENCOUNTER — Encounter (HOSPITAL_COMMUNITY)
Admission: RE | Disposition: A | Discharge status: Home / Self Care | Payer: Self-pay | Source: Home / Self Care | Attending: Orthopedic Surgery

## 2021-10-18 ENCOUNTER — Ambulatory Visit (HOSPITAL_COMMUNITY)
Admission: RE | Admit: 2021-10-18 | Discharge: 2021-10-18 | Disposition: A | Discharge status: Home / Self Care | Payer: BC Managed Care – PPO | Attending: Orthopedic Surgery | Admitting: Orthopedic Surgery

## 2021-10-18 DIAGNOSIS — S43431A Superior glenoid labrum lesion of right shoulder, initial encounter: Secondary | ICD-10-CM

## 2021-10-18 DIAGNOSIS — X58XXXA Exposure to other specified factors, initial encounter: Secondary | ICD-10-CM | POA: Insufficient documentation

## 2021-10-18 DIAGNOSIS — M94211 Chondromalacia, right shoulder: Secondary | ICD-10-CM | POA: Insufficient documentation

## 2021-10-18 DIAGNOSIS — M19019 Primary osteoarthritis, unspecified shoulder: Secondary | ICD-10-CM

## 2021-10-18 DIAGNOSIS — M75121 Complete rotator cuff tear or rupture of right shoulder, not specified as traumatic: Secondary | ICD-10-CM | POA: Diagnosis not present

## 2021-10-18 DIAGNOSIS — M75111 Incomplete rotator cuff tear or rupture of right shoulder, not specified as traumatic: Secondary | ICD-10-CM | POA: Diagnosis not present

## 2021-10-18 DIAGNOSIS — S43081A Other subluxation of right shoulder joint, initial encounter: Secondary | ICD-10-CM | POA: Diagnosis not present

## 2021-10-18 DIAGNOSIS — Z01818 Encounter for other preprocedural examination: Secondary | ICD-10-CM

## 2021-10-18 DIAGNOSIS — Z9889 Other specified postprocedural states: Secondary | ICD-10-CM | POA: Insufficient documentation

## 2021-10-18 DIAGNOSIS — K219 Gastro-esophageal reflux disease without esophagitis: Secondary | ICD-10-CM | POA: Insufficient documentation

## 2021-10-18 DIAGNOSIS — M19011 Primary osteoarthritis, right shoulder: Secondary | ICD-10-CM | POA: Diagnosis present

## 2021-10-18 HISTORY — PX: SHOULDER ARTHROSCOPY WITH DEBRIDEMENT AND BICEP TENDON REPAIR: SHX5690

## 2021-10-18 SURGERY — SHOULDER ARTHROSCOPY WITH DEBRIDEMENT AND BICEP TENDON REPAIR
Anesthesia: Regional | Site: Shoulder | Laterality: Right

## 2021-10-18 MED ORDER — SUGAMMADEX SODIUM 200 MG/2ML IV SOLN
INTRAVENOUS | Status: DC | PRN
Start: 2021-10-18 — End: 2021-10-18
  Administered 2021-10-18: 199.6 mg via INTRAVENOUS

## 2021-10-18 MED ORDER — ROCURONIUM BROMIDE 10 MG/ML (PF) SYRINGE
PREFILLED_SYRINGE | INTRAVENOUS | Status: DC | PRN
Start: 2021-10-18 — End: 2021-10-18
  Administered 2021-10-18: 40 mg via INTRAVENOUS
  Administered 2021-10-18: 100 mg via INTRAVENOUS

## 2021-10-18 MED ORDER — MIDAZOLAM HCL 2 MG/2ML IJ SOLN: 2.0000 mg | Freq: Once | INTRAMUSCULAR | Status: AC

## 2021-10-18 MED ORDER — OXYCODONE-ACETAMINOPHEN 5-325 MG PO TABS
1.0000 | ORAL_TABLET | ORAL | 0 refills | Status: DC | PRN
Start: 2021-10-18 — End: 2023-12-24

## 2021-10-18 MED ORDER — POVIDONE-IODINE 7.5 % EX SOLN
Freq: Once | CUTANEOUS | Status: DC
  Filled 2021-10-18: qty 118

## 2021-10-18 MED ORDER — FENTANYL CITRATE (PF) 250 MCG/5ML IJ SOLN
INTRAMUSCULAR | Status: AC
  Filled 2021-10-18: qty 5

## 2021-10-18 MED ORDER — DEXAMETHASONE SODIUM PHOSPHATE 10 MG/ML IJ SOLN
INTRAMUSCULAR | Status: AC
  Filled 2021-10-18: qty 1

## 2021-10-18 MED ORDER — LIDOCAINE 2% (20 MG/ML) 5 ML SYRINGE
INTRAMUSCULAR | Status: DC | PRN
  Administered 2021-10-18: 60 mg via INTRAVENOUS

## 2021-10-18 MED ORDER — PROPOFOL 10 MG/ML IV BOLUS
INTRAVENOUS | Status: DC | PRN
Start: 2021-10-18 — End: 2021-10-18
  Administered 2021-10-18: 200 mg via INTRAVENOUS

## 2021-10-18 MED ORDER — FENTANYL CITRATE (PF) 100 MCG/2ML IJ SOLN: 100.0000 ug | Freq: Once | INTRAMUSCULAR | Status: AC

## 2021-10-18 MED ORDER — ORAL CARE MOUTH RINSE: 15.0000 mL | Freq: Once | OROMUCOSAL | Status: AC

## 2021-10-18 MED ORDER — SODIUM CHLORIDE 0.9 % IR SOLN
Status: DC | PRN
  Administered 2021-10-18: 6000 mL

## 2021-10-18 MED ORDER — ROCURONIUM BROMIDE 10 MG/ML (PF) SYRINGE
PREFILLED_SYRINGE | INTRAVENOUS | Status: AC
  Filled 2021-10-18: qty 10

## 2021-10-18 MED ORDER — ACETAMINOPHEN 325 MG PO TABS: 325.0000 mg | ORAL_TABLET | ORAL | Status: DC | PRN

## 2021-10-18 MED ORDER — ONDANSETRON HCL 4 MG/2ML IJ SOLN
INTRAMUSCULAR | Status: DC | PRN
  Administered 2021-10-18: 4 mg via INTRAVENOUS

## 2021-10-18 MED ORDER — ACETAMINOPHEN 160 MG/5ML PO SOLN: 325.0000 mg | ORAL | Status: DC | PRN

## 2021-10-18 MED ORDER — CHLORHEXIDINE GLUCONATE 0.12 % MT SOLN
OROMUCOSAL | Status: AC
  Administered 2021-10-18: 15 mL via OROMUCOSAL
  Filled 2021-10-18: qty 15

## 2021-10-18 MED ORDER — CEFAZOLIN SODIUM-DEXTROSE 2-4 GM/100ML-% IV SOLN
INTRAVENOUS | Status: AC
  Filled 2021-10-18: qty 100

## 2021-10-18 MED ORDER — ACETAMINOPHEN 10 MG/ML IV SOLN
INTRAVENOUS | Status: AC
  Filled 2021-10-18: qty 100

## 2021-10-18 MED ORDER — FENTANYL CITRATE (PF) 100 MCG/2ML IJ SOLN
INTRAMUSCULAR | Status: AC
  Administered 2021-10-18: 100 ug via INTRAVENOUS
  Filled 2021-10-18: qty 2

## 2021-10-18 MED ORDER — MIDAZOLAM HCL 2 MG/2ML IJ SOLN
INTRAMUSCULAR | Status: AC
  Administered 2021-10-18: 2 mg via INTRAVENOUS
  Filled 2021-10-18: qty 2

## 2021-10-18 MED ORDER — DEXMEDETOMIDINE (PRECEDEX) IN NS 20 MCG/5ML (4 MCG/ML) IV SYRINGE
PREFILLED_SYRINGE | INTRAVENOUS | Status: DC | PRN
  Administered 2021-10-18: 20 ug via INTRAVENOUS

## 2021-10-18 MED ORDER — 0.9 % SODIUM CHLORIDE (POUR BTL) OPTIME
TOPICAL | Status: DC | PRN
  Administered 2021-10-18: 1000 mL

## 2021-10-18 MED ORDER — FENTANYL CITRATE (PF) 100 MCG/2ML IJ SOLN: 25.0000 ug | INTRAMUSCULAR | Status: DC | PRN

## 2021-10-18 MED ORDER — LACTATED RINGERS IV SOLN: INTRAVENOUS | Status: DC

## 2021-10-18 MED ORDER — METHOCARBAMOL 500 MG PO TABS: 500.0000 mg | ORAL_TABLET | Freq: Three times a day (TID) | ORAL | 0 refills | Status: DC | PRN

## 2021-10-18 MED ORDER — POVIDONE-IODINE 10 % EX SWAB
2.0000 "application " | Freq: Once | CUTANEOUS | Status: AC
  Administered 2021-10-18: 2 via TOPICAL

## 2021-10-18 MED ORDER — OXYCODONE HCL 5 MG PO TABS: 5.0000 mg | ORAL_TABLET | Freq: Once | ORAL | Status: DC | PRN

## 2021-10-18 MED ORDER — CHLORHEXIDINE GLUCONATE 0.12 % MT SOLN: 15.0000 mL | Freq: Once | OROMUCOSAL | Status: AC

## 2021-10-18 MED ORDER — LIDOCAINE 2% (20 MG/ML) 5 ML SYRINGE
INTRAMUSCULAR | Status: AC
  Filled 2021-10-18: qty 5

## 2021-10-18 MED ORDER — BUPIVACAINE LIPOSOME 1.3 % IJ SUSP
INTRAMUSCULAR | Status: DC | PRN
Start: 2021-10-18 — End: 2021-10-18
  Administered 2021-10-18: 10 mL via PERINEURAL

## 2021-10-18 MED ORDER — ACETAMINOPHEN 10 MG/ML IV SOLN
INTRAVENOUS | Status: DC | PRN
Start: 2021-10-18 — End: 2021-10-18
  Administered 2021-10-18: 1000 mg via INTRAVENOUS

## 2021-10-18 MED ORDER — CEFAZOLIN SODIUM-DEXTROSE 2-4 GM/100ML-% IV SOLN
2.0000 g | INTRAVENOUS | Status: AC
  Administered 2021-10-18: 2 g via INTRAVENOUS

## 2021-10-18 MED ORDER — ONDANSETRON HCL 4 MG/2ML IJ SOLN: 4.0000 mg | Freq: Once | INTRAMUSCULAR | Status: DC | PRN

## 2021-10-18 MED ORDER — BUPIVACAINE-EPINEPHRINE (PF) 0.5% -1:200000 IJ SOLN
INTRAMUSCULAR | Status: DC | PRN
  Administered 2021-10-18: 15 mL via PERINEURAL

## 2021-10-18 MED ORDER — FENTANYL CITRATE (PF) 250 MCG/5ML IJ SOLN
INTRAMUSCULAR | Status: DC | PRN
Start: 2021-10-18 — End: 2021-10-18
  Administered 2021-10-18: 100 ug via INTRAVENOUS

## 2021-10-18 MED ORDER — PHENYLEPHRINE HCL-NACL 20-0.9 MG/250ML-% IV SOLN
INTRAVENOUS | Status: DC | PRN
Start: 2021-10-18 — End: 2021-10-18
  Administered 2021-10-18: 50 ug/min via INTRAVENOUS

## 2021-10-18 MED ORDER — MEPERIDINE HCL 25 MG/ML IJ SOLN: 6.2500 mg | INTRAMUSCULAR | Status: DC | PRN

## 2021-10-18 MED ORDER — DEXAMETHASONE SODIUM PHOSPHATE 10 MG/ML IJ SOLN
INTRAMUSCULAR | Status: DC | PRN
  Administered 2021-10-18: 10 mg via INTRAVENOUS

## 2021-10-18 MED ORDER — OXYCODONE HCL 5 MG/5ML PO SOLN: 5.0000 mg | Freq: Once | ORAL | Status: DC | PRN

## 2021-10-18 SURGICAL SUPPLY — 62 items
ALCOHOL 70% 16 OZ (MISCELLANEOUS) ×2 IMPLANT
ANCHOR FBRTK 2.6 SUTURETAP 1.3 (Anchor) ×2 IMPLANT
ANCHOR SUT 1.8 FBRTK KNTLS 2SU (Anchor) ×1 IMPLANT
ANCHOR SWIVELOCK BIO 4.75X19.1 (Anchor) ×2 IMPLANT
BAG COUNTER SPONGE SURGICOUNT (BAG) ×2 IMPLANT
BLADE EXCALIBUR 4.0X13 (MISCELLANEOUS) ×2 IMPLANT
BLADE SURG 11 STRL SS (BLADE) ×2 IMPLANT
COOLER ICEMAN CLASSIC (MISCELLANEOUS) ×1 IMPLANT
CUTTER BONE 4.0MM X 13CM (MISCELLANEOUS) ×1 IMPLANT
DRAPE IMP U-DRAPE 54X76 (DRAPES) ×2 IMPLANT
DRAPE INCISE IOBAN 66X45 STRL (DRAPES) ×2 IMPLANT
DRAPE STERI 35X30 U-POUCH (DRAPES) ×2 IMPLANT
DRAPE U-SHAPE 47X51 STRL (DRAPES) ×4 IMPLANT
DRSG AQUACEL AG ADV 3.5X 4 (GAUZE/BANDAGES/DRESSINGS) ×1 IMPLANT
DRSG IV TEGADERM 3.5X4.5 STRL (GAUZE/BANDAGES/DRESSINGS) ×4 IMPLANT
DURAPREP 26ML APPLICATOR (WOUND CARE) ×2 IMPLANT
DW OUTFLOW CASSETTE/TUBE SET (MISCELLANEOUS) ×2 IMPLANT
ELECT REM PT RETURN 9FT ADLT (ELECTROSURGICAL) ×2
ELECTRODE REM PT RTRN 9FT ADLT (ELECTROSURGICAL) ×1 IMPLANT
GAUZE SPONGE 4X4 12PLY STRL LF (GAUZE/BANDAGES/DRESSINGS) ×1 IMPLANT
GAUZE XEROFORM 1X8 LF (GAUZE/BANDAGES/DRESSINGS) ×2 IMPLANT
GLOVE SRG 8 PF TXTR STRL LF DI (GLOVE) ×1 IMPLANT
GLOVE SURG ENC TEXT LTX SZ6.5 (GLOVE) ×2 IMPLANT
GLOVE SURG LTX SZ8 (GLOVE) ×2 IMPLANT
GLOVE SURG UNDER POLY LF SZ6.5 (GLOVE) ×2 IMPLANT
GLOVE SURG UNDER POLY LF SZ8 (GLOVE) ×2
GOWN STRL REUS W/ TWL LRG LVL3 (GOWN DISPOSABLE) ×3 IMPLANT
GOWN STRL REUS W/TWL LRG LVL3 (GOWN DISPOSABLE) ×6
KIT BASIN OR (CUSTOM PROCEDURE TRAY) ×2 IMPLANT
KIT STR SPEAR 1.8 FBRTK DISP (KITS) ×1 IMPLANT
KIT TURNOVER KIT B (KITS) ×2 IMPLANT
MANIFOLD NEPTUNE II (INSTRUMENTS) ×2 IMPLANT
NDL HYPO 25X1 1.5 SAFETY (NEEDLE) ×1 IMPLANT
NDL SCORPION MULTI FIRE (NEEDLE) IMPLANT
NDL SPNL 18GX3.5 QUINCKE PK (NEEDLE) ×1 IMPLANT
NEEDLE HYPO 25X1 1.5 SAFETY (NEEDLE) ×2 IMPLANT
NEEDLE SCORPION MULTI FIRE (NEEDLE) ×2 IMPLANT
NEEDLE SPNL 18GX3.5 QUINCKE PK (NEEDLE) ×2 IMPLANT
NS IRRIG 1000ML POUR BTL (IV SOLUTION) ×2 IMPLANT
PACK SHOULDER (CUSTOM PROCEDURE TRAY) ×2 IMPLANT
PAD ARMBOARD 7.5X6 YLW CONV (MISCELLANEOUS) ×4 IMPLANT
PORT APPOLLO RF 90DEGREE MULTI (SURGICAL WAND) ×2 IMPLANT
RESTRAINT HEAD UNIVERSAL NS (MISCELLANEOUS) ×2 IMPLANT
SLING ARM FOAM STRAP LRG (SOFTGOODS) ×1 IMPLANT
SLING ARM IMMOBILIZER LRG (SOFTGOODS) ×1 IMPLANT
SPONGE T-LAP 4X18 ~~LOC~~+RFID (SPONGE) ×2 IMPLANT
STRIP CLOSURE SKIN 1/2X4 (GAUZE/BANDAGES/DRESSINGS) ×1 IMPLANT
SUCTION FRAZIER HANDLE 10FR (MISCELLANEOUS)
SUCTION TUBE FRAZIER 10FR DISP (MISCELLANEOUS) IMPLANT
SUT ETHILON 3 0 PS 1 (SUTURE) ×2 IMPLANT
SUT VIC AB 0 CT1 27 (SUTURE) ×2
SUT VIC AB 0 CT1 27XBRD ANBCTR (SUTURE) IMPLANT
SUT VIC AB 1 CT1 36 (SUTURE) ×2 IMPLANT
SUT VIC AB 2-0 CT1 27 (SUTURE) ×4
SUT VIC AB 2-0 CT1 TAPERPNT 27 (SUTURE) IMPLANT
SUT VICRYL 0 UR6 27IN ABS (SUTURE) ×4 IMPLANT
SYS FBRTK BUTTON 2.6 (Anchor) ×2 IMPLANT
SYSTEM FBRTK BUTTON 2.6 (Anchor) IMPLANT
TOWEL GREEN STERILE (TOWEL DISPOSABLE) ×2 IMPLANT
TOWEL GREEN STERILE FF (TOWEL DISPOSABLE) ×2 IMPLANT
TUBING ARTHROSCOPY IRRIG 16FT (MISCELLANEOUS) ×2 IMPLANT
WATER STERILE IRR 1000ML POUR (IV SOLUTION) ×2 IMPLANT

## 2021-10-18 NOTE — Anesthesia Procedure Notes (Signed)
Anesthesia Regional Block: Interscalene brachial plexus block   Pre-Anesthetic Checklist: , timeout performed,  Correct Patient, Correct Site, Correct Laterality,  Correct Procedure, Correct Position, site marked,  Risks and benefits discussed,  Surgical consent,  Pre-op evaluation,  At surgeon's request and post-op pain management  Laterality: Right  Prep: chloraprep       Needles:  Injection technique: Single-shot  Needle Type: Echogenic Stimulator Needle     Needle Length: 5cm  Needle Gauge: 22     Additional Needles:   Procedures:, nerve stimulator,,, ultrasound used (permanent image in chart),,     Nerve Stimulator or Paresthesia:  Response: hand, 0.45 mA  Additional Responses:   Narrative:  Start time: 10/18/2021 10:45 AM End time: 10/18/2021 10:50 AM Injection made incrementally with aspirations every 5 mL.  Performed by: Personally  Anesthesiologist: Bethena Midget, MD  Additional Notes: Functioning IV was confirmed and monitors were applied.  A 44mm 22ga Arrow echogenic stimulator needle was used. Sterile prep and drape,hand hygiene and sterile gloves were used. Ultrasound guidance: relevant anatomy identified, needle position confirmed, local anesthetic spread visualized around nerve(s)., vascular puncture avoided.  Image printed for medical record. Negative aspiration and negative test dose prior to incremental administration of local anesthetic. The patient tolerated the procedure well.

## 2021-10-18 NOTE — Transfer of Care (Signed)
Immediate Anesthesia Transfer of Care Note  Patient: Jim Graham  Procedure(s) Performed: RIGHT SHOULDER ARTHROSCOPY, DEBRIDEMENT, BICEPS TENODESIS (Right: Shoulder)  Patient Location: PACU  Anesthesia Type:GA combined with regional for post-op pain  Level of Consciousness: awake  Airway & Oxygen Therapy: Patient Spontanous Breathing  Post-op Assessment: Report given to RN and Post -op Vital signs reviewed and stable  Post vital signs: Reviewed and stable  Last Vitals:  Vitals Value Taken Time  BP 110/69 10/18/21 1431  Temp    Pulse 65 10/18/21 1438  Resp 15 10/18/21 1438  SpO2 97 % 10/18/21 1438  Vitals shown include unvalidated device data.  Last Pain:  Vitals:   10/18/21 0950  TempSrc:   PainSc: 0-No pain         Complications: No notable events documented.

## 2021-10-18 NOTE — Op Note (Signed)
° °  10/18/2021  2:07 PM  PATIENT:  Jim Graham  66 y.o. male  PRE-OPERATIVE DIAGNOSIS:  right shoulder biceps subluxation  POST-OPERATIVE DIAGNOSIS:  right shoulder biceps subluxation, rotator cuff tear  PROCEDURE:  Procedure(s): RIGHT SHOULDER ARTHROSCOPY, DEBRIDEMENT, BICEPS TENODESIS, mini open rotator cuff tear repair  SURGEON:  Surgeon(s): Cammy Copa, MD  ASSISTANT: magnant pa  ANESTHESIA:   general  EBL: 50 ml    Total I/O In: 1000 [I.V.:1000] Out: 50 [Blood:50]  BLOOD ADMINISTERED: none  DRAINS: none   LOCAL MEDICATIONS USED:  none  SPECIMEN:  No Specimen  COUNTS:  YES  TOURNIQUET:  * No tourniquets in log *  DICTATION: .Other Dictation: Dictation Number [id number]  PLAN OF CARE: Discharge to home after PACU  PATIENT DISPOSITION:  PACU - hemodynamically stable

## 2021-10-18 NOTE — Anesthesia Preprocedure Evaluation (Addendum)
Anesthesia Evaluation  Patient identified by MRN, date of birth, ID band Patient awake    Reviewed: Allergy & Precautions, H&P , NPO status , Patient's Chart, lab work & pertinent test results, reviewed documented beta blocker date and time   Airway Mallampati: I  TM Distance: >3 FB Neck ROM: full    Dental no notable dental hx. (+) Teeth Intact, Dental Advisory Given   Pulmonary neg pulmonary ROS,    Pulmonary exam normal breath sounds clear to auscultation       Cardiovascular Exercise Tolerance: Good negative cardio ROS   Rhythm:regular Rate:Normal     Neuro/Psych negative neurological ROS  negative psych ROS   GI/Hepatic Neg liver ROS, GERD  Medicated,  Endo/Other  negative endocrine ROS  Renal/GU negative Renal ROS  negative genitourinary   Musculoskeletal   Abdominal   Peds  Hematology negative hematology ROS (+)   Anesthesia Other Findings   Reproductive/Obstetrics negative OB ROS                            Anesthesia Physical Anesthesia Plan  ASA: 2  Anesthesia Plan: General and Regional   Post-op Pain Management: Ofirmev IV (intra-op)   Induction: Intravenous  PONV Risk Score and Plan: 2 and Ondansetron, Treatment may vary due to age or medical condition, Dexamethasone and Midazolam  Airway Management Planned: Oral ETT  Additional Equipment: None  Intra-op Plan:   Post-operative Plan: Extubation in OR  Informed Consent: I have reviewed the patients History and Physical, chart, labs and discussed the procedure including the risks, benefits and alternatives for the proposed anesthesia with the patient or authorized representative who has indicated his/her understanding and acceptance.     Dental Advisory Given  Plan Discussed with: CRNA and Anesthesiologist  Anesthesia Plan Comments:       Anesthesia Quick Evaluation

## 2021-10-18 NOTE — Anesthesia Procedure Notes (Signed)
Procedure Name: Intubation Date/Time: 10/18/2021 11:44 AM Performed by: Minerva Ends, CRNA Pre-anesthesia Checklist: Patient identified, Emergency Drugs available, Suction available and Patient being monitored Patient Re-evaluated:Patient Re-evaluated prior to induction Oxygen Delivery Method: Circle system utilized Preoxygenation: Pre-oxygenation with 100% oxygen Induction Type: IV induction Ventilation: Mask ventilation without difficulty Laryngoscope Size: Mac and 3 Grade View: Grade I Tube type: Oral Tube size: 7.0 mm Number of attempts: 1 Airway Equipment and Method: Stylet and Oral airway Placement Confirmation: ETT inserted through vocal cords under direct vision, positive ETCO2 and breath sounds checked- equal and bilateral Secured at: 23 cm Tube secured with: Tape Dental Injury: Teeth and Oropharynx as per pre-operative assessment

## 2021-10-18 NOTE — Op Note (Signed)
NAME: [name], [name] MEDICAL RECORD NO: [id number] ACCOUNT NO: [id number] DATE OF BIRTH: [birth year] FACILITY: [facility] LOCATION: [facility] PHYSICIAN: Yetta Barre. Marlou Sa, MD  Operative Report   DATE OF PROCEDURE: 10/18/2021  PREOPERATIVE DIAGNOSIS:  Right shoulder biceps tendon subluxation.  POSTOPERATIVE DIAGNOSES:  Right shoulder moderate glenohumeral joint arthritis with biceps tendon subluxation and 1.5 x 2 cm rotator cuff tear.  PROCEDURE:  Right shoulder arthroscopy with extensive debridement of the superior labrum as well as chondral surface of the humeral head with mini open biceps tenodesis and rotator cuff repair.  SURGEON:  Yetta Barre. Marlou Sa, MD  ASSISTANT:  Annie Main, PA  INDICATIONS:  Jim Graham is a 66 year old patient with right shoulder pain.  He has had multiple prior surgeries and presents now for operative management after explanation of risks and benefits.  PROCEDURE IN DETAIL:  The patient was brought to the operating room where general anesthetic was induced.  Preoperative antibiotics were administered.  Timeout was called.  Right shoulder pre-scrubbed with alcohol and Betadine, and allowed to air dry.  Prepped with DuraPrep solution and draped in a sterile manner.  Ioban used to seal the operative field as well as seal the axilla.  Timeout was called.  Right arm was examined under anesthesia.  Found to have passive range of motion of 55/95/165.  After  calling timeout, posterior portal was created 2 cm medial and inferior to the posterolateral margin of the acromion.  Diagnostic arthroscopy was performed.  Anterior portal created under direct visualization.  There was an area of grade 3 to 4  chondromalacia on the humeral head with loose chondral flaps, which were debrided.  This measured about 30% of the surface of the humeral head.  Biceps tendon was subluxated medially.  After establishing an anterior portal, the biceps tendon was  released.  Debridement performed on the  superior labrum.  Glenoid also had grade 3 changes over essentially its entire face.  Anterior inferior and posterior inferior glenohumeral ligaments were intact.  Next, instruments were removed.  Portals were  closed using 3-0 nylon.  Ioban then used to cover the entire operative field.  Incision made off the anterolateral margin of the acromion.  Skin and subcutaneous tissue sharply divided.  The incision was about 4 cm.  Deltoid was split between the middle  and anterior raphae, marked with a #1 Vicryl suture at the 4 cm mark. The bursectomy was performed.  The biceps tendon was then identified.  Subscap appeared intact, but the biceps tendon was over the subscap.  Next, the bicipital sheath was incised.   Biceps tendon was mobilized back into the bicipital groove.  Biceps tenodesis was performed after putting a grasping suture through the biceps tendon.  One knotless suture anchor 1.9 mm was placed in the distal aspect of the bicipital groove. Under  appropriate tension, the tendon was tightened.  Next, a 3.2 mm knotless SutureTak was placed with two limbs with 2 knotless suture passing mechanisms.  These were utilized in the 2 sutures at the proximal aspect of the biceps tendon, which under  appropriate tension had been cut.  Next, these were tacked down into the superior region of the bicipital groove and one of the suture limbs was placed back through the biceps tendon and tied.  The entire construct was oversewn with a remnant transverse  humeral ligament.  This gave a very secure construct.  Attention was then directed towards the rotator cuff tear.  Revision acromioplasty performed.  There was severe attenuation  of the supraspinatus attachment site, about 95% thickness tearing.  This  was debrided.  This left about a 1.5 x 2 cm tear.  Two Arthrex SutureTape anchors were placed at the prepared medial border of the greater tuberosity.  Using a Scorpion, these 8 suture limbs were passed and then tied  into 4 knots.  Two 0 Vicryl sutures  were placed in the leading edge of the rotator cuff tear.  The entire construct was mobilized back to the footprint and then tacked down using SwiveLocks with the arm in abduction x 2.  Watertight repair was achieved.  Arm was taken through range of  motion, found to have very good stability.  Next, thorough irrigation was performed.  Deltoid split closed using #1 Vicryl suture followed by interrupted inverted 0 Vicryl suture, 2-0 Vicryl suture, and 3-0 Monocryl with Steri-Strips and impervious  dressings applied.  The patient tolerated the procedure well without immediate complications, transferred to the recovery room in stable condition.   VAI D: 10/18/2021 2:13:54 pm T: 10/18/2021 8:58:00 pm  JOB: [place] [id number]

## 2021-10-18 NOTE — Anesthesia Postprocedure Evaluation (Signed)
Anesthesia Post Note  Patient: Jim Graham  Procedure(s) Performed: RIGHT SHOULDER ARTHROSCOPY, DEBRIDEMENT, BICEPS TENODESIS (Right: Shoulder)     Patient location during evaluation: PACU Anesthesia Type: Regional and General Level of consciousness: awake and alert Pain management: pain level controlled Vital Signs Assessment: post-procedure vital signs reviewed and stable Respiratory status: spontaneous breathing, nonlabored ventilation, respiratory function stable and patient connected to nasal cannula oxygen Cardiovascular status: blood pressure returned to baseline and stable Postop Assessment: no apparent nausea or vomiting Anesthetic complications: no   No notable events documented.  Last Vitals:  Vitals:   10/18/21 1500 10/18/21 1515  BP: 113/79 116/79  Pulse: 68 70  Resp: 15 17  Temp:  36.4 C  SpO2: 98% 96%    Last Pain:  Vitals:   10/18/21 1515  TempSrc:   PainSc: 0-No pain                 Samier Jaco

## 2021-10-18 NOTE — Progress Notes (Signed)
Dr. Ambrose Pancoast aware of elevated blood pressure.

## 2021-10-18 NOTE — H&P (Signed)
Jim Graham is an 66 y.o. male.   Chief Complaint: right shoulder pain HPI: Jim Graham is a 66 year old patient with right shoulder pain.  Had previous right shoulder arthroscopy with subacromial decompression and extensive debridement.  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  He does light weights.  Also likes to do push-ups.  Reports pain as well as catching with forward flexion and localizes his symptoms in the anterior aspect of the shoulder.  Had an injection in the right shoulder recently which only gave him 1 day of relief.  Currently taking no pains daily for symptoms.  He is very physically active.  The injection with Dr. Ernestina Patches 08/18/2001 gave no relief of symptoms.  MRI scan of the right shoulder from 09/19/2021 is reviewed with the patient.  MRI report states some cartilage loss within the glenohumeral joint along with biceps tendinosis and supraspinatus tendinosis with partial-thickness tear.  However, when I reviewed the scan with the patient it is apparent that the biceps tendon is subluxated out of the bicipital groove which can be seen on both the axial as well as coronal views.  I think this more adequately explains his symptoms  Past Medical History:  Diagnosis Date   GERD (gastroesophageal reflux disease)    Hyperlipidemia    Palpitations     Past Surgical History:  Procedure Laterality Date   bicep re-attachment     NASAL SINUS SURGERY     shoulder arthroscopy Left 2018   SHOULDER ARTHROSCOPY Right 08/26/2020    Family History  Adopted: Yes  Problem Relation Age of Onset   Heart attack Paternal Grandfather    Social History:  reports that he has never smoked. He has never used smokeless tobacco. He reports that he does not drink alcohol and does not use drugs.  Allergies: No Known Allergies  Medications Prior to Admission  Medication Sig Dispense Refill   aspirin 81 MG tablet Take 81 mg by mouth daily.       celecoxib (CELEBREX) 200 MG capsule TAKE 1 CAPSULE (200 MG  TOTAL) BY MOUTH 2 (TWO) TIMES DAILY AS NEEDED. 60 capsule 3   Coenzyme Q10 100 MG capsule Take 100 mg by mouth daily.     Multiple Vitamin (MULTIVITAMIN PO) Take 1 Package by mouth daily.     rosuvastatin (CRESTOR) 10 MG tablet TAKE 1 TABLET BY MOUTH AT 1PM AND 1 TAB AT DINNER. (Patient taking differently: Take 10 mg by mouth daily.) 15 tablet 0   testosterone cypionate (DEPOTESTOSTERONE CYPIONATE) 200 MG/ML injection Inject 100 mg into the muscle every 7 (seven) days.     valACYclovir (VALTREX) 500 MG tablet Take 500 mg by mouth 2 (two) times daily.     cyclobenzaprine (FLEXERIL) 5 MG tablet Take 1-2 tablets (5-10 mg total) by mouth at bedtime. (Patient not taking: Reported on 10/07/2021) 30 tablet 1   methocarbamol (ROBAXIN) 500 MG tablet TAKE 1 TABLET (500 MG TOTAL) BY MOUTH EVERY 8 (EIGHT) HOURS AS NEEDED FOR MUSCLE SPASMS. (Patient not taking: Reported on 10/07/2021) 60 tablet 0   oxyCODONE-acetaminophen (PERCOCET/ROXICET) 5-325 MG tablet Take 1 tablet by mouth every 4 (four) hours as needed for severe pain. (Patient not taking: Reported on 10/07/2021) 30 tablet 0    No results found for this or any previous visit (from the past 48 hour(s)). No results found.  Review of Systems  Musculoskeletal:  Positive for arthralgias.  All other systems reviewed and are negative.  Blood pressure 127/76, pulse 86, temperature 98.3 F (  36.8 C), temperature source Oral, resp. rate 14, height 5\' 10"  (1.778 m), weight 99.8 kg, SpO2 97 %. Physical Exam Vitals reviewed.  HENT:     Head: Normocephalic.     Nose: Nose normal.     Mouth/Throat:     Mouth: Mucous membranes are moist.  Eyes:     Pupils: Pupils are equal, round, and reactive to light.  Cardiovascular:     Rate and Rhythm: Normal rate.     Pulses: Normal pulses.  Pulmonary:     Effort: Pulmonary effort is normal.  Abdominal:     General: Abdomen is flat.  Musculoskeletal:     Cervical back: Normal range of motion.  Skin:    General:  Skin is warm.     Capillary Refill: Capillary refill takes less than 2 seconds.  Neurological:     General: No focal deficit present.     Mental Status: He is alert.  Psychiatric:        Mood and Affect: Mood normal.    Ortho exam demonstrates pretty reasonable rotator cuff strength infraspinatus supraspinatus absent muscle testing on the right left-hand side.  Passive range of motion on the right is 50/90/170.  No discrete AC joint tenderness right versus left.  No coarse grinding or crepitus with internal or external rotation on the right-hand side.  O'Brien's testing equivocal on the right negative on the left.  No Popeye deformity.    Assessment/Plan : Impression is age-appropriate degenerative changes within that glenohumeral joint and rotator cuff in an active patient.  However he does have progressive biceps subluxation with attenuation of the transverse humeral ligament seen on the axial and coronal views on most recent MRI scan.  This does appear to be progressive compared to prior imaging studies.  Plan at this time would be arthroscopy with evaluation of the rotator cuff and biceps tendon release with debridement and biceps tenodesis.  The upper portion of the subscapularis does not appear to be involved with the biceps subluxation but I think this is more problem of the transverse humeral ligament.  Risk and benefits of the procedure are discussed including not limited to infection nerve vessel damage incomplete pain relief as well as potential for incomplete restoration of function.  We may also have to address the rotator cuff tear which is partial-thickness on MRI scan but may be bigger in person.  Patient understands the risk and benefits as well as the extensive rehabilitative process involved.  All questions answered    Anderson Malta, MD 10/18/2021, 11:14 AM

## 2021-10-19 ENCOUNTER — Ambulatory Visit: Payer: BC Managed Care – PPO | Admitting: Orthopedic Surgery

## 2021-10-19 ENCOUNTER — Telehealth: Payer: Self-pay | Admitting: Orthopedic Surgery

## 2021-10-19 MED ORDER — ONDANSETRON HCL 4 MG PO TABS: 4.0000 mg | ORAL_TABLET | Freq: Three times a day (TID) | ORAL | 0 refills | Status: DC | PRN

## 2021-10-19 NOTE — Telephone Encounter (Signed)
Pt called requesting a prescription for nausea medication. He had surgery yesterday and states he need it as soon as possible. Please call pt and sent to [facility] in [place]. Pt phone number is [phone].

## 2021-10-19 NOTE — Telephone Encounter (Signed)
Contacted patient and he states that CPM person was at his home today 10/19/2021.

## 2021-10-19 NOTE — Telephone Encounter (Signed)
Pt had right shoulder scope yesterday, he was told to pick up a CPM machine for ROM to use 3x per day.

## 2021-10-19 NOTE — Telephone Encounter (Signed)
Submitted zofran to pharmacy for patient He has been made aware

## 2021-10-19 NOTE — Telephone Encounter (Signed)
tyvm

## 2021-10-20 ENCOUNTER — Encounter (HOSPITAL_COMMUNITY): Payer: Self-pay | Admitting: Orthopedic Surgery

## 2021-10-26 ENCOUNTER — Encounter: Payer: Self-pay | Admitting: Orthopedic Surgery

## 2021-10-26 ENCOUNTER — Other Ambulatory Visit: Payer: Self-pay

## 2021-10-26 ENCOUNTER — Ambulatory Visit (INDEPENDENT_AMBULATORY_CARE_PROVIDER_SITE_OTHER): Payer: BC Managed Care – PPO | Admitting: Orthopedic Surgery

## 2021-10-26 DIAGNOSIS — M7521 Bicipital tendinitis, right shoulder: Secondary | ICD-10-CM

## 2021-10-26 DIAGNOSIS — Z9889 Other specified postprocedural states: Secondary | ICD-10-CM

## 2021-10-26 NOTE — Progress Notes (Signed)
Post-Op Visit Note   Patient: Jim Graham           Date of Birth: 1956-09-05           MRN: [id number] Visit Date: 10/26/2021 PCP: Lupita Raider, MD   Assessment & Plan:  Chief Complaint:  Chief Complaint  Patient presents with   Right Shoulder - Routine Post Op     10/18/21 (8d)  Right Shoulder Arthroscopy, Debridement, Biceps Tenodesis      Visit Diagnoses:  1. Biceps tendinitis of right shoulder   2. Status post rotator cuff repair     Plan: Patient is a 66 year old male who presents s/p right shoulder arthroscopy with debridement, biceps tenodesis, mini open rotator cuff tear repair on 10/18/2021.  Doing well overall.  No problems according to him.  He is up to 75 degrees on CPM machine.  He started back with running and walking though he is only using the sling when he is running.  He takes occasional Celebrex and Tylenol for pain control.  Not taking any oxycodone.  Walking 4 miles.  Cautioned against any active range of motion of the operative arm or any lifting with the operative arm.  He agreed with this plan.  On exam patient has 25 degrees external rotation, 70 degrees abduction, 120 degrees forward flexion.  Incisions are healing well without evidence of infection or dehiscence.  No crepitus or grinding noted with passive motion of the shoulder.  Axillary nerve intact with deltoid firing.  Intact EPL, FPL, finger abduction, finger adduction, wrist extension, bicep flexion, tricep extension.  Plan is continue with CPM machine.  Discontinue sling in 2 weeks.  Start [facility] physical therapy in about 2 weeks.  Op note provided today.  Follow-up in 4 weeks for clinical recheck.  He understands he is not to be lifting the arm actively or lifting anything with the arm.  Follow-Up Instructions: No follow-ups on file.   Orders:  No orders of the defined types were placed in this encounter.  No orders of the defined types were placed in this encounter.   Imaging: No  results found.  PMFS History: Patient Active Problem List   Diagnosis Date Noted   Chronic right shoulder pain 08/31/2021   History of arthroscopy of left shoulder 12/21/2016   GERD (gastroesophageal reflux disease) 05/25/2015   Agatston coronary artery calcium score less than 100 02/08/2015   ED (erectile dysfunction) 02/08/2015   Pulmonary nodules 02/08/2015   Dilated aortic root (HCC) 02/08/2015   Chest pain 08/24/2011   Hyperlipidemia 08/24/2011   Elevated BP 08/24/2011   History of palpitations 12/16/2010   Past Medical History:  Diagnosis Date   GERD (gastroesophageal reflux disease)    Hyperlipidemia    Palpitations     Family History  Adopted: Yes  Problem Relation Age of Onset   Heart attack Paternal Grandfather     Past Surgical History:  Procedure Laterality Date   bicep re-attachment     NASAL SINUS SURGERY     shoulder arthroscopy Left 2018   SHOULDER ARTHROSCOPY Right 08/26/2020   SHOULDER ARTHROSCOPY WITH DEBRIDEMENT AND BICEP TENDON REPAIR Right 10/18/2021   Procedure: RIGHT SHOULDER ARTHROSCOPY, DEBRIDEMENT, BICEPS TENODESIS;  Surgeon: Cammy Copa, MD;  Location: [facility] OR;  Service: Orthopedics;  Laterality: Right;   Social History   Occupational History   Occupation: Fireman    CommentLexicographer  Tobacco Use   Smoking status: Never   Smokeless tobacco: Never  Vaping  Use   Vaping Use: Never used  Substance and Sexual Activity   Alcohol use: No   Drug use: No   Sexual activity: Not on file

## 2021-11-08 ENCOUNTER — Telehealth: Payer: Self-pay

## 2021-11-08 NOTE — Telephone Encounter (Signed)
Jim Graham called stating that she needs a pt order for patient

## 2021-11-08 NOTE — Telephone Encounter (Signed)
IC advised that Dr August Saucer had provided order and op note to patient for PT>  They advised that they did actually have order for PT.

## 2021-11-16 ENCOUNTER — Other Ambulatory Visit: Payer: Self-pay

## 2021-11-16 ENCOUNTER — Ambulatory Visit (INDEPENDENT_AMBULATORY_CARE_PROVIDER_SITE_OTHER): Payer: BC Managed Care – PPO | Admitting: Orthopedic Surgery

## 2021-11-16 DIAGNOSIS — Z9889 Other specified postprocedural states: Secondary | ICD-10-CM

## 2021-11-18 ENCOUNTER — Encounter: Payer: Self-pay | Admitting: Orthopedic Surgery

## 2021-11-18 NOTE — Progress Notes (Signed)
Post-Op Visit Note   Patient: Jim Graham           Date of Birth: Feb 27, 1956           MRN: [id number] Visit Date: 11/16/2021 PCP: Mayra Neer, MD   Assessment & Plan:  Chief Complaint:  Chief Complaint  Patient presents with   Right Shoulder - Routine Post Op           10/18/21 (8d)Right Shoulder Arthroscopy, Debridement, Biceps Tenodesis    Visit Diagnoses:  1. Status post rotator cuff repair     Plan: Patient is a 66 year old male who presents s/p right shoulder arthroscopy with biceps tenodesis and rotator cuff repair on 10/18/2021.  He is doing well overall going to physical therapy 2 times per week.  Doing well with this and really only working on range of motion with no strengthening exercises yet.  He does some curls with a 1 ounce bar at home.  He is up to 87 degrees in the CPM chair.  Using ice twice per day with no need for medications.  Occasional lateral pain but nothing consistent.  External rotation to 45 degrees, abduction to 80 degrees, forward flexion 130 degrees.  No crepitus noted past motion of the shoulder.  Excellent rotator cuff strength rated 5/5 of supra, infra, subscap.  Mild pain with supination.  No Popeye deformity noted.  Incisions are well-healed.  Axillary nerve intact with deltoid firing.  Plan is to continue with physical therapy and follow-up in 4 weeks for clinical recheck.  He wants to be done with physical therapy at the end of February and then transition to home exercise program.  As long as he is doing well at his follow-up appointment in 4 weeks, he may discontinue from PT.  Patient agreed with plan.  Follow-up in 4 weeks.  Follow-Up Instructions: No follow-ups on file.   Orders:  No orders of the defined types were placed in this encounter.  No orders of the defined types were placed in this encounter.   Imaging: No results found.  PMFS History: Patient Active Problem List   Diagnosis Date Noted   Chronic right shoulder  pain 08/31/2021   History of arthroscopy of left shoulder 12/21/2016   GERD (gastroesophageal reflux disease) 05/25/2015   Agatston coronary artery calcium score less than 100 02/08/2015   ED (erectile dysfunction) 02/08/2015   Pulmonary nodules 02/08/2015   Dilated aortic root ([place]) 02/08/2015   Chest pain 08/24/2011   Hyperlipidemia 08/24/2011   Elevated BP 08/24/2011   History of palpitations 12/16/2010   Past Medical History:  Diagnosis Date   GERD (gastroesophageal reflux disease)    Hyperlipidemia    Palpitations     Family History  Adopted: Yes  Problem Relation Age of Onset   Heart attack Paternal Grandfather     Past Surgical History:  Procedure Laterality Date   bicep re-attachment     NASAL SINUS SURGERY     shoulder arthroscopy Left 2018   SHOULDER ARTHROSCOPY Right 08/26/2020   SHOULDER ARTHROSCOPY WITH DEBRIDEMENT AND BICEP TENDON REPAIR Right 10/18/2021   Procedure: RIGHT SHOULDER ARTHROSCOPY, DEBRIDEMENT, BICEPS TENODESIS;  Surgeon: Meredith Pel, MD;  Location: [place];  Service: Orthopedics;  Laterality: Right;   Social History   Occupational History   Occupation: Fireman    CommentPublishing rights manager  Tobacco Use   Smoking status: Never   Smokeless tobacco: Never  Vaping Use   Vaping Use: Never used  Substance and Sexual  Activity   Alcohol use: No   Drug use: No   Sexual activity: Not on file

## 2021-11-19 DIAGNOSIS — M75121 Complete rotator cuff tear or rupture of right shoulder, not specified as traumatic: Secondary | ICD-10-CM

## 2021-11-19 DIAGNOSIS — M19019 Primary osteoarthritis, unspecified shoulder: Secondary | ICD-10-CM

## 2021-11-19 DIAGNOSIS — S43431A Superior glenoid labrum lesion of right shoulder, initial encounter: Secondary | ICD-10-CM

## 2021-12-14 ENCOUNTER — Other Ambulatory Visit: Payer: Self-pay

## 2021-12-14 ENCOUNTER — Ambulatory Visit (INDEPENDENT_AMBULATORY_CARE_PROVIDER_SITE_OTHER): Payer: BC Managed Care – PPO | Admitting: Orthopedic Surgery

## 2021-12-14 DIAGNOSIS — Z9889 Other specified postprocedural states: Secondary | ICD-10-CM

## 2021-12-16 ENCOUNTER — Encounter: Payer: Self-pay | Admitting: Orthopedic Surgery

## 2021-12-16 NOTE — Progress Notes (Signed)
? ?  Post-Op Visit Note ?  ?Patient: Jim Graham           ?Date of Birth: January 04, 1956           ?MRN: [id number] ?Visit Date: 12/14/2021 ?PCP: Lupita Raider, MD ? ? ?Assessment & Plan: ? ?Chief Complaint:  ?Chief Complaint  ?Patient presents with  ? Right Shoulder - Follow-up  ?  10/18/21 (8w 1d) Right Shoulder Arthroscopy, Debridement, Biceps Tenodesis - Right ? ?  ? ?Visit Diagnoses:  ?1. Status post rotator cuff repair   ? ? ?Plan: Jim Graham is a patient who is now about 2 months out right shoulder arthroscopy debridement biceps tenodesis and mini open rotator cuff tear repair.  He had some superior humeral head arthritis as well.  Finished physical therapy yesterday 12 visits.  On exam he has good range of motion and strength.  A little bit of coarseness posteriorly consistent with his known diagnosis of arthritis.  Cuff strength is improving.  Like to see him back in 6 weeks for final check.  Cautioned him to be very careful with overdoing it with lifting out away from his body. ? ?Follow-Up Instructions: Return in about 6 weeks (around 01/25/2022).  ? ?Orders:  ?No orders of the defined types were placed in this encounter. ? ?No orders of the defined types were placed in this encounter. ? ? ?Imaging: ?No results found. ? ?PMFS History: ?Patient Active Problem List  ? Diagnosis Date Noted  ? Complete tear of right rotator cuff   ? Degenerative superior labral anterior-to-posterior (SLAP) tear of right shoulder   ? Shoulder arthritis   ? Chronic right shoulder pain 08/31/2021  ? History of arthroscopy of left shoulder 12/21/2016  ? GERD (gastroesophageal reflux disease) 05/25/2015  ? Agatston coronary artery calcium score less than 100 02/08/2015  ? ED (erectile dysfunction) 02/08/2015  ? Pulmonary nodules 02/08/2015  ? Dilated aortic root (HCC) 02/08/2015  ? Chest pain 08/24/2011  ? Hyperlipidemia 08/24/2011  ? Elevated BP 08/24/2011  ? History of palpitations 12/16/2010  ? ?Past Medical History:  ?Diagnosis Date   ? GERD (gastroesophageal reflux disease)   ? Hyperlipidemia   ? Palpitations   ?  ?Family History  ?Adopted: Yes  ?Problem Relation Age of Onset  ? Heart attack Paternal Grandfather   ?  ?Past Surgical History:  ?Procedure Laterality Date  ? bicep re-attachment    ? NASAL SINUS SURGERY    ? shoulder arthroscopy Left 2018  ? SHOULDER ARTHROSCOPY Right 08/26/2020  ? SHOULDER ARTHROSCOPY WITH DEBRIDEMENT AND BICEP TENDON REPAIR Right 10/18/2021  ? Procedure: RIGHT SHOULDER ARTHROSCOPY, DEBRIDEMENT, BICEPS TENODESIS;  Surgeon: Cammy Copa, MD;  Location: [facility] OR;  Service: Orthopedics;  Laterality: Right;  ? ?Social History  ? ?Occupational History  ? Occupation: [employer]  ?  Comment: [employer]  ?Tobacco Use  ? Smoking status: Never  ? Smokeless tobacco: Never  ?Vaping Use  ? Vaping Use: Never used  ?Substance and Sexual Activity  ? Alcohol use: No  ? Drug use: No  ? Sexual activity: Not on file  ? ? ? ?

## 2022-01-25 ENCOUNTER — Encounter: Payer: Self-pay | Admitting: Orthopedic Surgery

## 2022-01-25 ENCOUNTER — Ambulatory Visit (INDEPENDENT_AMBULATORY_CARE_PROVIDER_SITE_OTHER): Payer: 59 | Admitting: Orthopedic Surgery

## 2022-01-25 DIAGNOSIS — Z9889 Other specified postprocedural states: Secondary | ICD-10-CM

## 2022-01-25 NOTE — Progress Notes (Signed)
? ?  Post-Op Visit Note ?  ?Patient: Jim Graham           ?Date of Birth: 08/28/1956           ?MRN: [id number] ?Visit Date: 01/25/2022 ?PCP: Lupita Raider, MD ? ? ?Assessment & Plan: ? ?Chief Complaint:  ?Chief Complaint  ?Patient presents with  ? Right Shoulder - Routine Post Op  ? ?Visit Diagnoses:  ?1. Status post rotator cuff repair   ? ? ?Plan: Jim Graham is a 66 year old patient with right shoulder surgery performed 10/18/2021.  Here for his final check.  This was arthroscopy debridement biceps tenodesis.  He has been doing reasonably well.  On examination shoulder passive range of motion is 60/90/160.  Plan at this time is to work with higher reps and less weight with his shoulder.  Cautioned him against doing too much heavy weightlifting because that would accelerate the arthritis in the shoulder.  He may consider throwing baseball in a few months.  Overall though he is going to continue to work on strengthening and motion.  He will follow-up with [place] as needed.  He is happy with his surgical result. ? ?Follow-Up Instructions: Return if symptoms worsen or fail to improve.  ? ?Orders:  ?No orders of the defined types were placed in this encounter. ? ?No orders of the defined types were placed in this encounter. ? ? ?Imaging: ?No results found. ? ?PMFS History: ?Patient Active Problem List  ? Diagnosis Date Noted  ? Complete tear of right rotator cuff   ? Degenerative superior labral anterior-to-posterior (SLAP) tear of right shoulder   ? Shoulder arthritis   ? Chronic right shoulder pain 08/31/2021  ? History of arthroscopy of left shoulder 12/21/2016  ? GERD (gastroesophageal reflux disease) 05/25/2015  ? Agatston coronary artery calcium score less than 100 02/08/2015  ? ED (erectile dysfunction) 02/08/2015  ? Pulmonary nodules 02/08/2015  ? Dilated aortic root (HCC) 02/08/2015  ? Chest pain 08/24/2011  ? Hyperlipidemia 08/24/2011  ? Elevated BP 08/24/2011  ? History of palpitations 12/16/2010  ? ?Past Medical  History:  ?Diagnosis Date  ? GERD (gastroesophageal reflux disease)   ? Hyperlipidemia   ? Palpitations   ?  ?Family History  ?Adopted: Yes  ?Problem Relation Age of Onset  ? Heart attack Paternal Grandfather   ?  ?Past Surgical History:  ?Procedure Laterality Date  ? bicep re-attachment    ? NASAL SINUS SURGERY    ? shoulder arthroscopy Left 2018  ? SHOULDER ARTHROSCOPY Right 08/26/2020  ? SHOULDER ARTHROSCOPY WITH DEBRIDEMENT AND BICEP TENDON REPAIR Right 10/18/2021  ? Procedure: RIGHT SHOULDER ARTHROSCOPY, DEBRIDEMENT, BICEPS TENODESIS;  Surgeon: Cammy Copa, MD;  Location: [facility] OR;  Service: Orthopedics;  Laterality: Right;  ? ?Social History  ? ?Occupational History  ? Occupation: [employer]  ?  Comment: [place]  ?Tobacco Use  ? Smoking status: Never  ? Smokeless tobacco: Never  ?Vaping Use  ? Vaping Use: Never used  ?Substance and Sexual Activity  ? Alcohol use: No  ? Drug use: No  ? Sexual activity: Not on file  ? ? ? ?

## 2022-01-28 ENCOUNTER — Other Ambulatory Visit: Payer: Self-pay | Admitting: Physical Medicine and Rehabilitation

## 2022-02-02 ENCOUNTER — Other Ambulatory Visit: Payer: Self-pay | Admitting: Physical Medicine and Rehabilitation

## 2022-02-02 MED ORDER — CELECOXIB 200 MG PO CAPS: 200.0000 mg | ORAL_CAPSULE | Freq: Two times a day (BID) | ORAL | 3 refills | Status: DC | PRN

## 2022-03-03 DIAGNOSIS — J019 Acute sinusitis, unspecified: Secondary | ICD-10-CM | POA: Diagnosis not present

## 2022-05-15 DIAGNOSIS — E291 Testicular hypofunction: Secondary | ICD-10-CM | POA: Diagnosis not present

## 2022-05-15 DIAGNOSIS — E669 Obesity, unspecified: Secondary | ICD-10-CM | POA: Diagnosis not present

## 2022-05-15 DIAGNOSIS — E782 Mixed hyperlipidemia: Secondary | ICD-10-CM | POA: Diagnosis not present

## 2022-05-15 DIAGNOSIS — R430 Anosmia: Secondary | ICD-10-CM | POA: Diagnosis not present

## 2022-05-15 DIAGNOSIS — R7303 Prediabetes: Secondary | ICD-10-CM | POA: Diagnosis not present

## 2022-05-15 DIAGNOSIS — D751 Secondary polycythemia: Secondary | ICD-10-CM | POA: Diagnosis not present

## 2022-08-22 DIAGNOSIS — L718 Other rosacea: Secondary | ICD-10-CM | POA: Diagnosis not present

## 2022-08-22 DIAGNOSIS — L918 Other hypertrophic disorders of the skin: Secondary | ICD-10-CM | POA: Diagnosis not present

## 2022-08-22 DIAGNOSIS — Z85828 Personal history of other malignant neoplasm of skin: Secondary | ICD-10-CM | POA: Diagnosis not present

## 2022-08-22 DIAGNOSIS — L821 Other seborrheic keratosis: Secondary | ICD-10-CM | POA: Diagnosis not present

## 2022-09-01 DIAGNOSIS — D751 Secondary polycythemia: Secondary | ICD-10-CM | POA: Diagnosis not present

## 2022-09-01 DIAGNOSIS — E782 Mixed hyperlipidemia: Secondary | ICD-10-CM | POA: Diagnosis not present

## 2022-09-01 DIAGNOSIS — Z1159 Encounter for screening for other viral diseases: Secondary | ICD-10-CM | POA: Diagnosis not present

## 2022-09-01 DIAGNOSIS — R7303 Prediabetes: Secondary | ICD-10-CM | POA: Diagnosis not present

## 2022-09-01 DIAGNOSIS — E291 Testicular hypofunction: Secondary | ICD-10-CM | POA: Diagnosis not present

## 2022-09-01 DIAGNOSIS — Z0000 Encounter for general adult medical examination without abnormal findings: Secondary | ICD-10-CM | POA: Diagnosis not present

## 2022-09-01 DIAGNOSIS — F411 Generalized anxiety disorder: Secondary | ICD-10-CM | POA: Diagnosis not present

## 2022-09-22 ENCOUNTER — Ambulatory Visit: Payer: Medicare Other | Attending: Internal Medicine | Admitting: Internal Medicine

## 2022-09-22 ENCOUNTER — Encounter: Payer: Self-pay | Admitting: Internal Medicine

## 2022-09-22 VITALS — BP 130/80 | HR 70 | Ht 70.0 in | Wt 235.0 lb

## 2022-09-22 DIAGNOSIS — I77810 Thoracic aortic ectasia: Secondary | ICD-10-CM | POA: Diagnosis not present

## 2022-09-22 DIAGNOSIS — E782 Mixed hyperlipidemia: Secondary | ICD-10-CM | POA: Diagnosis not present

## 2022-09-22 DIAGNOSIS — I2584 Coronary atherosclerosis due to calcified coronary lesion: Secondary | ICD-10-CM | POA: Diagnosis not present

## 2022-09-22 DIAGNOSIS — I2510 Atherosclerotic heart disease of native coronary artery without angina pectoris: Secondary | ICD-10-CM

## 2022-09-22 NOTE — Progress Notes (Unsigned)
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 He is also on chronic suppressive therapy for HSV. He was praised evaluated for atypical chest pain and palpitations in 2012. He underwent an exercise echocardiogram which was negative for ischemia. He also wore monitor for. At time. Symptoms improved and were related to stress. He does not know his medical history secondary to being adopted. Recently he's had more discomfort mostly in his left shoulder and some in the left upper chest but no evidence for substernal chest pain. He's also been having worsening symptoms which she attributes to reflux. He was switched from Nexium to Dexilant. In addition, he notes that when he takes Zantac he has marked improvement in his symptoms. 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  The pleasure see [name] back in the office today. Overall he is doing well denies any further chest pain or shortness of breath. In fact she's managed to lose about 20 pounds since his last office visit. He says he's on the [employer] diet. This is a combination of caloric restriction, hCG, other vitamins and appetite suppressants. He also continues to exercise. He had a lipid profile performed which showed total cholesterol 140, glyceride 66, HL 43, LDL 84. He's currently on Crestor 5 mg daily. The CT scan of his coronary Arteries was performed which demonstrated a low coronary artery calcium score of 46, but was located in the proximal to mid LAD. The aortic root was mildly dilated 4.0 cm. [facility] indicated several small subcentimeter pulmonary nodules, the largest being 5 mm. A repeat CT scan was  recommended in one year. He is not a smoker and at lower risk for bronchogenic carcinoma. In addition, today he reported some recent erectile dysfunction, which was unrelated to her follow-up. He is asking about Viagra.  [name] returns today for follow-up. Over the weekend he had an episode where he developed pain on the left and right sides of his chest laterally that radiated down his arms. There is no central chest discomfort. It seemed to occurred fairly recently after eating. He did take a Zantac and went to the emergency room. He said he waited outside of the emergency room and his symptoms resolved and he ultimately was never seen. He then started taking his Dexilant again. He has noted some improvement in his symptoms but is not completely resolved. He says he recently saw his gastroenterologist but was not having any symptoms at the time. He continues to exercise at a significant rate without any chest pain or shortness of breath.  [name] returns today for follow-up. He reports some occasional left shoulder pain but is able to exercise at a high rate without any chest pain or worsening shortness of breath. He exercises fairly regularly. He recently thought he was having side effects from his Crestor discontinued that however the symptoms did not go away and he restarted his medicine. He is due for repeat lipid profile. He is also due for one year follow-up of his last chest CT which demonstrated several small sub-pulmonary nodules. There was also a mildly dilated aortic root to 4.0 cm.  04/23/2017  [name]  was seen today in follow-up. He underwent a recent repeat CT which showed no evidence of aortic dilatation and no change in his small subpulmonary nodules. No further CT was recommended. He denies any chest pain or worsening shortness of breath. He continues to exercise regularly. 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 We  recently received labs from his primary care provider which shows a total cholesterol of 149, temperature glyceride 88, HDL-C 40, LDL-C 91 and non-HDL-C of 409. I review the results today including his 10 year and 51 year MESA and pulled cohort risk assessments given his coronary artery calcium. He is at 6.5 and 7.5% risk respectively based on those databases and has a long-term risk of about 36% of developing a hard cardiovascular event. With this in mind his LDL-C is a goal less than 811, however his non-HDL-C is at goal <130. We discussed that based on current data if he were to be very aggressive about his medical therapy he may want to try to drive his LDL-C less than 70 or at least get his non-HDL-C less than 100. We may accomplish this with a small increase in his Crestor.  06/07/2018  [name] seen today in follow-up.  Overall seems to be doing well.  He denies any chest pain or shortness of breath.  He exercises regularly.  Recently gained some weight combination of both muscle mass and he feels like he may be more sedentary.  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  Recent labs in April 2019 showed total cholesterol 158, HDL 45, LDL 87 and triglycerides 132.  EKG sinus rhythm at 84.  04/28/2020  [name] is seen today in follow-up.  He seems to be doing very well.  He is exercising more regularly.  He had gained some weight during the pandemic but is managed to get it off and generally is feeling much better.  His lipids have improved significantly.  LDL cholesterol is around 70.  EKG is normal.  His blood pressure is well controlled.  09/22/2022  [name] is seen today in follow-up.  He is overall without complaints.  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  He did have prior dilation of the ascending aorta seen on a CT scan back in 2016 however subsequently this was not again  visualized.  His calcium score at that time was 46 which was 56 percentile for age and sex matched controls.  Blood pressure has been well-controlled.  EKG today is normal with some nonspecific T wave changes.  He remains on low-dose aspirin and rosuvastatin.  Recent labs showed total cholesterol 138, HDL 42, triglycerides 103 and LDL 78.  PMHx:  Past Medical History:  Diagnosis Date   GERD (gastroesophageal reflux disease)    Hyperlipidemia    Palpitations     Past Surgical History:  Procedure Laterality Date   bicep re-attachment     NASAL SINUS SURGERY     shoulder arthroscopy Left 2018   SHOULDER ARTHROSCOPY Right 08/26/2020   SHOULDER ARTHROSCOPY WITH DEBRIDEMENT AND BICEP TENDON REPAIR Right 10/18/2021   Procedure: RIGHT SHOULDER ARTHROSCOPY, DEBRIDEMENT, BICEPS TENODESIS;  Surgeon: Cammy Copa, MD;  Location: [facility] OR;  Service: Orthopedics;  Laterality: Right;    FAMHx:  Family History  Adopted: Yes  Problem Relation Age of Onset   Heart attack  Paternal Grandfather     SOCHx:   reports that he has never smoked. He has never used smokeless tobacco. He reports that he does not drink alcohol and does not use drugs.  ALLERGIES:  No Known Allergies  ROS: Pertinent items noted in HPI and remainder of comprehensive ROS otherwise negative.  HOME MEDS: Current Outpatient Medications  Medication Sig Dispense Refill   aspirin 81 MG tablet Take 81 mg by mouth daily.       celecoxib (CELEBREX) 200 MG capsule Take 1 capsule (200 mg total) by mouth 2 (two) times daily as needed. 60 capsule 3   rosuvastatin (CRESTOR) 10 MG tablet TAKE 1 TABLET BY MOUTH AT 1PM AND 1 TAB AT DINNER. 15 tablet 0   testosterone cypionate (DEPOTESTOSTERONE CYPIONATE) 200 MG/ML injection Inject 100 mg into the muscle every 7 (seven) days.     valACYclovir (VALTREX) 500 MG tablet Take 500 mg by mouth 2 (two) times daily.     Coenzyme Q10 100 MG capsule Take 100 mg by mouth daily. (Patient not taking:  Reported on 09/22/2022)     methocarbamol (ROBAXIN) 500 MG tablet Take 1 tablet (500 mg total) by mouth every 8 (eight) hours as needed for muscle spasms. (Patient not taking: Reported on 09/22/2022) 60 tablet 0   Multiple Vitamin (MULTIVITAMIN PO) Take 1 Package by mouth daily. (Patient not taking: Reported on 09/22/2022)     ondansetron (ZOFRAN) 4 MG tablet Take 1 tablet (4 mg total) by mouth every 8 (eight) hours as needed for nausea or vomiting. (Patient not taking: Reported on 09/22/2022) 20 tablet 0   oxyCODONE-acetaminophen (PERCOCET/ROXICET) 5-325 MG tablet Take 1 tablet by mouth every 4 (four) hours as needed for severe pain. (Patient not taking: Reported on 09/22/2022) 30 tablet 0   No current facility-administered medications for this visit.    LABS/IMAGING: No results found for this or any previous visit (from the past 48 hour(s)). No results found.  VITALS: BP 130/80   Pulse 70   Ht 5\' 10"  (1.778 m)   Wt 235 lb (106.6 kg)   SpO2 94%   BMI 33.72 kg/m   EXAM: General appearance: alert and no distress Neck: no carotid bruit, no JVD and thyroid not enlarged, symmetric, no tenderness/mass/nodules Lungs: clear to auscultation bilaterally Heart: regular rate and rhythm, S1, S2 normal, no murmur, click, rub or gallop Abdomen: soft, non-tender; bowel sounds normal; no masses,  no organomegaly Extremities: extremities normal, atraumatic, no cyanosis or edema Pulses: 2+ and symmetric Skin: Skin color, texture, turgor normal. No rashes or lesions Neurologic: Grossly normal Psych: Pleasant  EKG: Normal sinus rhythm at 70, nonspecific T wave changes-personally reviewed  ASSESSMENT: Atypical left upper chest/shoulder pain - low coronary calcium score 46 (2016) GERD Dyslipidemia Erectile dysfunction Mildly dilated aortic root to 4.0 cm - not noted in follow-up studies Subcentimeter pulmonary nodules - stable, do not require follow-up Low testosterone  PLAN: 1.   Mr. Tierce  seems to be doing well.  He did have a dilated aortic root but this was not seen in follow-up.  His last calcium score was in 2016.  As it has been 7 years since the prior study and he has been on therapy and would like to repeat that which will give 2017 a measurement of his aorta as well.  He remains asymptomatic.  Follow-up with me annually or sooner as necessary.  [place], MD, [facility], FACP  [place]  [facility]  Medical Director of the  Advanced Lipid Disorders &  Cardiovascular Risk Reduction Clinic Diplomate of the American Board of Clinical Lipidology Attending Cardiologist  Direct Dial: [phone]  Fax: [phone]  Website:  [email or web address] Sitlali Koerner 09/22/2022, 2:18 PM

## 2022-09-22 NOTE — Patient Instructions (Signed)
Medication Instructions:  NO CHANGES  *If you need a refill on your cardiac medications before your next appointment, please call your pharmacy*   Testing/Procedures: Dr. Nicanor Bake has ordered a CT coronary calcium score.   Test locations:  [place]   This is $99 out of pocket.   Coronary CalciumScan A coronary calcium scan is an imaging test used to look for deposits of calcium and other fatty materials (plaques) in the inner lining of the blood vessels of the heart (coronary arteries). These deposits of calcium and plaques can partly clog and narrow the coronary arteries without producing any symptoms or warning signs. This puts a person at risk for a heart attack. This test can detect these deposits before symptoms develop. Tell a health care provider about: Any allergies you have. All medicines you are taking, including vitamins, herbs, eye drops, creams, and over-the-counter medicines. Any problems you or family members have had with anesthetic medicines. Any blood disorders you have. Any surgeries you have had. Any medical conditions you have. Whether you are pregnant or may be pregnant. What are the risks? Generally, this is a safe procedure. However, problems may occur, including: Harm to a pregnant woman and her unborn baby. This test involves the use of radiation. Radiation exposure can be dangerous to a pregnant woman and her unborn baby. If you are pregnant, you generally should not have this procedure done. Slight increase in the risk of cancer. This is because of the radiation involved in the test. What happens before the procedure? No preparation is needed for this procedure. What happens during the procedure? You will undress and remove any jewelry around your neck or chest. You will put on a hospital gown. Sticky electrodes will be placed on your chest. The electrodes will be connected to an electrocardiogram (ECG) machine to record a  tracing of the electrical activity of your heart. A CT scanner will take pictures of your heart. During this time, you will be asked to lie still and hold your breath for 2-3 seconds while a picture of your heart is being taken. The procedure may vary among health care providers and hospitals. What happens after the procedure? You can get dressed. You can return to your normal activities. It is up to you to get the results of your test. Ask your health care provider, or the department that is doing the test, when your results will be ready. Summary A coronary calcium scan is an imaging test used to look for deposits of calcium and other fatty materials (plaques) in the inner lining of the blood vessels of the heart (coronary arteries). Generally, this is a safe procedure. Tell your health care provider if you are pregnant or may be pregnant. No preparation is needed for this procedure. A CT scanner will take pictures of your heart. You can return to your normal activities after the scan is done. This information is not intended to replace advice given to you by your health care provider. Make sure you discuss any questions you have with your health care provider. Document Released: 03/30/2008 Document Revised: 08/21/2016 Document Reviewed: 08/21/2016 Elsevier Interactive Patient Education  2017 [place]: At [facility], you and your health needs are our priority.  As part of our continuing mission to provide you with exceptional heart care, we have created designated Provider Care Teams.  These Care Teams include your primary Cardiologist (physician) and Advanced Practice Providers (APPs -  Physician  Assistants and Nurse Practitioners) who all work together to provide you with the care you need, when you need it.  We recommend signing up for the patient portal called "MyChart".  Sign up information is provided on this After Visit Summary.  MyChart is used to connect  with patients for Virtual Visits (Telemedicine).  Patients are able to view lab/test results, encounter notes, upcoming appointments, etc.  Non-urgent messages can be sent to your provider as well.   To learn more about what you can do with MyChart, go to [email or web address].    Your next appointment:    12 months with Dr. Rennis Golden

## 2022-10-02 DIAGNOSIS — K219 Gastro-esophageal reflux disease without esophagitis: Secondary | ICD-10-CM | POA: Diagnosis not present

## 2022-10-02 DIAGNOSIS — Z86010 Personal history of colonic polyps: Secondary | ICD-10-CM | POA: Diagnosis not present

## 2022-10-26 DIAGNOSIS — U071 COVID-19: Secondary | ICD-10-CM | POA: Diagnosis not present

## 2022-10-26 DIAGNOSIS — R059 Cough, unspecified: Secondary | ICD-10-CM | POA: Diagnosis not present

## 2022-10-26 DIAGNOSIS — R509 Fever, unspecified: Secondary | ICD-10-CM | POA: Diagnosis not present

## 2022-10-26 DIAGNOSIS — I2510 Atherosclerotic heart disease of native coronary artery without angina pectoris: Secondary | ICD-10-CM | POA: Diagnosis not present

## 2022-10-26 DIAGNOSIS — J3489 Other specified disorders of nose and nasal sinuses: Secondary | ICD-10-CM | POA: Diagnosis not present

## 2022-11-09 ENCOUNTER — Ambulatory Visit (HOSPITAL_BASED_OUTPATIENT_CLINIC_OR_DEPARTMENT_OTHER)
Admission: RE | Admit: 2022-11-09 | Discharge: 2022-11-09 | Disposition: A | Discharge status: Home / Self Care | Payer: Self-pay | Source: Ambulatory Visit | Attending: Internal Medicine | Admitting: Internal Medicine

## 2022-11-09 DIAGNOSIS — I77810 Thoracic aortic ectasia: Secondary | ICD-10-CM | POA: Insufficient documentation

## 2022-11-09 DIAGNOSIS — I2584 Coronary atherosclerosis due to calcified coronary lesion: Secondary | ICD-10-CM | POA: Insufficient documentation

## 2022-11-09 DIAGNOSIS — I2510 Atherosclerotic heart disease of native coronary artery without angina pectoris: Secondary | ICD-10-CM | POA: Insufficient documentation

## 2022-12-04 DIAGNOSIS — R7303 Prediabetes: Secondary | ICD-10-CM | POA: Diagnosis not present

## 2022-12-04 DIAGNOSIS — F411 Generalized anxiety disorder: Secondary | ICD-10-CM | POA: Diagnosis not present

## 2022-12-04 DIAGNOSIS — E291 Testicular hypofunction: Secondary | ICD-10-CM | POA: Diagnosis not present

## 2022-12-04 DIAGNOSIS — D751 Secondary polycythemia: Secondary | ICD-10-CM | POA: Diagnosis not present

## 2022-12-04 DIAGNOSIS — E782 Mixed hyperlipidemia: Secondary | ICD-10-CM | POA: Diagnosis not present

## 2022-12-12 ENCOUNTER — Other Ambulatory Visit: Payer: Self-pay | Admitting: *Deleted

## 2022-12-12 ENCOUNTER — Telehealth: Payer: Self-pay | Admitting: Internal Medicine

## 2022-12-12 DIAGNOSIS — E782 Mixed hyperlipidemia: Secondary | ICD-10-CM

## 2022-12-12 DIAGNOSIS — I77810 Thoracic aortic ectasia: Secondary | ICD-10-CM

## 2022-12-12 NOTE — Telephone Encounter (Signed)
Spoke with patient about results.

## 2022-12-12 NOTE — Telephone Encounter (Signed)
Pt is returning call in regards to results. Transferred to [place], Therapist, sports.

## 2023-02-28 DIAGNOSIS — L821 Other seborrheic keratosis: Secondary | ICD-10-CM | POA: Diagnosis not present

## 2023-02-28 DIAGNOSIS — L820 Inflamed seborrheic keratosis: Secondary | ICD-10-CM | POA: Diagnosis not present

## 2023-02-28 DIAGNOSIS — Z85828 Personal history of other malignant neoplasm of skin: Secondary | ICD-10-CM | POA: Diagnosis not present

## 2023-02-28 DIAGNOSIS — L812 Freckles: Secondary | ICD-10-CM | POA: Diagnosis not present

## 2023-02-28 DIAGNOSIS — D225 Melanocytic nevi of trunk: Secondary | ICD-10-CM | POA: Diagnosis not present

## 2023-03-13 DIAGNOSIS — F411 Generalized anxiety disorder: Secondary | ICD-10-CM | POA: Diagnosis not present

## 2023-03-13 DIAGNOSIS — R7303 Prediabetes: Secondary | ICD-10-CM | POA: Diagnosis not present

## 2023-03-13 DIAGNOSIS — E291 Testicular hypofunction: Secondary | ICD-10-CM | POA: Diagnosis not present

## 2023-03-13 DIAGNOSIS — Z0000 Encounter for general adult medical examination without abnormal findings: Secondary | ICD-10-CM | POA: Diagnosis not present

## 2023-03-13 DIAGNOSIS — E782 Mixed hyperlipidemia: Secondary | ICD-10-CM | POA: Diagnosis not present

## 2023-03-13 DIAGNOSIS — D751 Secondary polycythemia: Secondary | ICD-10-CM | POA: Diagnosis not present

## 2023-03-13 LAB — LAB REPORT - SCANNED: EGFR: 91

## 2023-03-20 DIAGNOSIS — K080 Exfoliation of teeth due to systemic causes: Secondary | ICD-10-CM | POA: Diagnosis not present

## 2023-03-27 ENCOUNTER — Telehealth: Payer: Self-pay | Admitting: Internal Medicine

## 2023-03-27 NOTE — Telephone Encounter (Signed)
Follow Up:    Patient wanted to know if you received his lab results from his primary doctor?

## 2023-03-27 NOTE — Telephone Encounter (Signed)
Patient wants the doctor to be aware he had labs drawn with his PCP.  They are scanned in to chart.  Please advise if any changes need to be made.  Thank you

## 2023-04-02 NOTE — Telephone Encounter (Signed)
Called patient, LVM advising of message from MD.  Left call back number with questions.

## 2023-04-12 DIAGNOSIS — R945 Abnormal results of liver function studies: Secondary | ICD-10-CM | POA: Diagnosis not present

## 2023-05-17 DIAGNOSIS — H524 Presbyopia: Secondary | ICD-10-CM | POA: Diagnosis not present

## 2023-05-21 ENCOUNTER — Ambulatory Visit: Payer: Medicare Other | Admitting: Podiatry

## 2023-05-21 DIAGNOSIS — M722 Plantar fascial fibromatosis: Secondary | ICD-10-CM | POA: Diagnosis not present

## 2023-05-21 MED ORDER — TRIAMCINOLONE ACETONIDE 10 MG/ML IJ SUSP
10.0000 mg | Freq: Once | INTRAMUSCULAR | Status: AC
  Administered 2023-05-21: 10 mg via INTRAMUSCULAR

## 2023-05-21 NOTE — Progress Notes (Signed)
Chief Complaint  Patient presents with   Foot Pain    Pain in the right heel that started about four days ago. Pt tries to ease the pain with ice but it did not help.    HPI: 67 y.o. male presenting today with c/o pain in the bottom of the right heel.  Denies injury.  He has had plantar fasciitis in the past.  He notes that the pain that has developed over the past few days he is mostly on the plantar medial aspect of the right heel as well as closer to the plantar posterior junction.  Denies posterior heel pain.  Denies injury.  Denies bruising.  Past Medical History:  Diagnosis Date   GERD (gastroesophageal reflux disease)    Hyperlipidemia    Palpitations     Past Surgical History:  Procedure Laterality Date   bicep re-attachment     NASAL SINUS SURGERY     shoulder arthroscopy Left 2018   SHOULDER ARTHROSCOPY Right 08/26/2020   SHOULDER ARTHROSCOPY WITH DEBRIDEMENT AND BICEP TENDON REPAIR Right 10/18/2021   Procedure: RIGHT SHOULDER ARTHROSCOPY, DEBRIDEMENT, BICEPS TENODESIS;  Surgeon: Cammy Copa, MD;  Location: [facility] OR;  Service: Orthopedics;  Laterality: Right;    No Known Allergies   Physical Exam: General: The patient is alert and oriented x3 in no acute distress.  Dermatology:  No ecchymosis, erythema, or edema bilateral.  No open lesions.    Vascular: Palpable pedal pulses bilaterally. Capillary refill within normal limits.  No appreciable edema.    Neurological: Light touch sensation intact bilateral.  MMT 5/5 to lower extremity bilateral. Negative Tinel's sign with percussion of the posterior tibial nerve on the affected extremity.    Musculoskeletal Exam:  There is pain on palpation of the plantarmedial aspect of right heel.  No gaps or nodules within the plantar fascia.  Positive Windlass mechanism bilateral.  Antalgic gait noted with first few steps upon standing.  No pain on palpation of achilles tendon bilateral.  Ankle df less than 10 degrees with  knee extended b/l.  Assessment/Plan of Care: 1. Plantar fasciitis of right foot    -Reviewed etiology of plantar fasciitis with patient.  Discussed treatment options with patient today, including cortisone injection, NSAID course of treatment, stretching exercises, physical therapy, use of night splint, rest, icing the heel, arch supports/orthotics, and supportive shoe gear.    A posterior night splint was fitted and dispensed today, size large.  He will wear this at all times nonweightbearing.  This is a static AFO to be worn when nonweightbearing/at night.  He has a soft interface material.  With the patient's consent a corticosteroid injection was administered to the plantar medial aspect of the right heel.  This consisted of a mixture of 1% Xylocaine plain, 0.5% Marcaine plain and Kenalog 10 for total of 1.25 cc administered.  A Band-Aid was applied.  He tolerated this well.  He will resume stretching exercises in approximately 24 to 48 hours.  His previous orthotics were evaluated and they do appear to be a good fit feet.  Instructed him to wear these at all times weightbearing.  He already takes Celebrex for his lower back and will continue to do so which should help to heel.    Will hold off on physical therapy at this time.  Return in about 4 weeks (around 06/18/2023) for f/u plantar fasciitis.   Clerance Lav, DPM, FACFAS [facility]     [address] [place], [place] [address]                Office [phone]  Fax [phone]

## 2023-05-30 ENCOUNTER — Ambulatory Visit: Payer: Medicare Other | Admitting: Podiatry

## 2023-06-14 DIAGNOSIS — E782 Mixed hyperlipidemia: Secondary | ICD-10-CM | POA: Diagnosis not present

## 2023-06-14 DIAGNOSIS — D751 Secondary polycythemia: Secondary | ICD-10-CM | POA: Diagnosis not present

## 2023-06-14 DIAGNOSIS — R7303 Prediabetes: Secondary | ICD-10-CM | POA: Diagnosis not present

## 2023-06-14 DIAGNOSIS — E669 Obesity, unspecified: Secondary | ICD-10-CM | POA: Diagnosis not present

## 2023-06-14 DIAGNOSIS — E291 Testicular hypofunction: Secondary | ICD-10-CM | POA: Diagnosis not present

## 2023-06-25 ENCOUNTER — Ambulatory Visit: Payer: Medicare Other | Admitting: Podiatry

## 2023-06-25 DIAGNOSIS — M722 Plantar fascial fibromatosis: Secondary | ICD-10-CM

## 2023-06-25 NOTE — Progress Notes (Unsigned)
   Chief Complaint  Patient presents with   Plantar Fasciitis    Pt still gets pain in the heel he  use of night splint, rest, icing the heel, arch supports/orthotics but it doesn't seem to be helping much.   HPI: 67 y.o. male presenting today for f/u of  pain in the bottom of the right heel.  Pain is rated as 6/10.  States he's better, but still has pain.  He got two pairs of custom orthotics recently, but notes he cannot wear them.  They are too hard.  So, he's been wearing his OTC supports.  He has not called our orthotics department to note how uncomfortable they are.  Past Medical History:  Diagnosis Date   GERD (gastroesophageal reflux disease)    Hyperlipidemia    Palpitations     Past Surgical History:  Procedure Laterality Date   bicep re-attachment     NASAL SINUS SURGERY     shoulder arthroscopy Left 2018   SHOULDER ARTHROSCOPY Right 08/26/2020   SHOULDER ARTHROSCOPY WITH DEBRIDEMENT AND BICEP TENDON REPAIR Right 10/18/2021   Procedure: RIGHT SHOULDER ARTHROSCOPY, DEBRIDEMENT, BICEPS TENODESIS;  Surgeon: Cammy Copa, MD;  Location: [facility] OR;  Service: Orthopedics;  Laterality: Right;    No Known Allergies   Physical Exam: General: The patient is alert and oriented x3 in no acute distress.  Dermatology:  No ecchymosis, erythema, or edema bilateral.  No open lesions.    Vascular: Palpable pedal pulses bilaterally. Capillary refill within normal limits.  No appreciable edema.    Neurological: Light touch sensation intact bilateral.  MMT 5/5 to lower extremity bilateral. Negative Tinel's sign with percussion of the posterior tibial nerve on the affected extremity.    Musculoskeletal Exam:  There is pain on palpation of the plantarmedial & plantarcentral aspect of right heel.  No gaps or nodules within the plantar fascia.  Positive Windlass mechanism bilateral.  Antalgic gait noted with first few steps upon standing.  No pain on palpation of achilles tendon bilateral.   Ankle df less than 10 degrees with knee extended b/l.  Assessment/Plan of Care: 1. Plantar fasciitis of right foot    With the patient's verbal consent, a corticosteroid injection was administered to the right heel, just proximal to previous location, consisting of a mixture of 1% lidocaine plain, 0.5% Sensorcaine plain, and Kenalog-10 for a total of 1.5cc administered.  A Band-aid was applied. Pain level post-injection is 2/10.  He was fitted for a Plantar fascial brace.    Will set up appt with our pedorthist to discuss his orthotics and the fit since he is saying they are too hard uncomfortable to wear.   Return in about 6 weeks (around 08/06/2023) for f/u plantar fasciitis.   Clerance Lav, DPM, FACFAS [facility]     [address] [place], [place] [address]                Office [phone]  Fax [phone]

## 2023-07-12 ENCOUNTER — Ambulatory Visit: Payer: Medicare Other

## 2023-07-12 NOTE — Progress Notes (Signed)
Orthotic eval   Patient was seen, measured for custom molded foot orthotics  Patient will benefit from CFO's as they will help provide total contact to MLA's helping to better distribute body weight across BIL feet greater reducing plantar pressure and pain and to also encourage FF and RF alignment  Patient was scanned items to be ordered and fit when in  [employer], CFo, CFm

## 2023-08-06 ENCOUNTER — Ambulatory Visit: Payer: Medicare Other | Admitting: Podiatry

## 2023-08-06 DIAGNOSIS — M722 Plantar fascial fibromatosis: Secondary | ICD-10-CM

## 2023-08-06 NOTE — Progress Notes (Unsigned)
      Chief Complaint  Patient presents with   Plantar Fasciitis    Recheck plantar fasciitis right foot-  has been wearing the plantar fascial brace.  Good days and bad days-    Orthotics are in.  Has paperwork for insurance.     HPI: 67 y.o. male presenting today for f/u of right plantar fasciitis  Overall he's doing better.  He seems to feel he'll need the surgery to correct this in the future.    Past Medical History:  Diagnosis Date   GERD (gastroesophageal reflux disease)    Hyperlipidemia    Palpitations     Past Surgical History:  Procedure Laterality Date   bicep re-attachment     NASAL SINUS SURGERY     shoulder arthroscopy Left 2018   SHOULDER ARTHROSCOPY Right 08/26/2020   SHOULDER ARTHROSCOPY WITH DEBRIDEMENT AND BICEP TENDON REPAIR Right 10/18/2021   Procedure: RIGHT SHOULDER ARTHROSCOPY, DEBRIDEMENT, BICEPS TENODESIS;  Surgeon: Cammy Copa, MD;  Location: [facility] OR;  Service: Orthopedics;  Laterality: Right;    No Known Allergies   Physical Exam: General: The patient is alert and oriented x3 in no acute distress.  Dermatology:  No ecchymosis, erythema, or edema bilateral.  No open lesions.    Vascular: Palpable pedal pulses bilaterally. Capillary refill within normal limits.  No appreciable edema.    Neurological: Light touch sensation intact bilateral.  MMT 5/5 to lower extremity bilateral. Negative Tinel's sign with percussion of the posterior tibial nerve on the affected extremity.    Musculoskeletal Exam:  There is pain on palpation of the plantarmedial & plantarcentral aspect of right heel.  No gaps or nodules within the plantar fascia.  Positive Windlass mechanism bilateral.  Antalgic gait noted with first few steps upon standing.  No pain on palpation of achilles tendon bilateral.  Ankle df less than 10 degrees with knee extended b/l.  Assessment/Plan of Care: 1. Plantar fasciitis of right foot    Continue with stretching exercises  Informed the  patient his braces and diagnosis codes are correct, there is nothing to change to "get it covered".  However, he can appeal to his own insurance if he'd like.  These were handed to our practice administrator  His custom orthotics were fitted and dispensed today.  The toe area needed to be trimmed to fit into his shoes properly.  Reviewed break-in period and advised patient to call in 2-4 weeks if adjustments need made.   Return in about 6 weeks (around 09/17/2023) for f/u plantar fasciitis.   Clerance Lav, DPM, FACFAS [facility]     [address] [place], [place] [address]                Office [phone]  Fax [phone]

## 2023-08-09 NOTE — Addendum Note (Signed)
Addended byLucia Estelle D on: 08/09/2023 08:57 AM   Modules accepted: Level of Service

## 2023-08-27 ENCOUNTER — Ambulatory Visit: Payer: Medicare Other | Admitting: Orthopedic Surgery

## 2023-08-27 ENCOUNTER — Other Ambulatory Visit (INDEPENDENT_AMBULATORY_CARE_PROVIDER_SITE_OTHER): Payer: Self-pay

## 2023-08-27 ENCOUNTER — Encounter: Payer: Self-pay | Admitting: Orthopedic Surgery

## 2023-08-27 DIAGNOSIS — M25512 Pain in left shoulder: Secondary | ICD-10-CM

## 2023-08-27 NOTE — Progress Notes (Unsigned)
Office Visit Note   Patient: Jim Graham           Date of Birth: Dec 21, 1955           MRN: [id number] Visit Date: 08/27/2023 Requested by: Lupita Raider, MD [address] [place],  [place] [address] PCP: Lupita Raider, MD  Subjective: Chief Complaint  Patient presents with   Left Shoulder - Pain    HPI: Jim Graham is a 67 y.o. male who presents to the office reporting left shoulder pain.  Patient had prior surgery in 2018 which ones some type of scope and impingement surgery.  Patient has done well with his right shoulder surgery but I did several years ago.  Patient states he is very active and lifts weights about 6 times a week.  He is right-hand dominant.  Patient states his range of motion is limited at times due to pain.  Also reports grinding in the shoulder.  Patient states the pain does not wake him from sleep at night.  No numbness and tingling.  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  Takes some over-the-counter medication for the problem..                ROS: All systems reviewed are negative as they relate to the chief complaint within the history of present illness.  Patient denies fevers or chills.  Assessment & Plan: Visit Diagnoses:  1. Left shoulder pain, unspecified chronicity     Plan: Impression is left shoulder arthritis longstanding with possible biceps tendinitis as the only none replacement treatable pain generator in the shoulder.  His rotator cuff strength is good.  Has failed conservative treatment for 6 to 8 weeks including a home exercise program which is essentially weightlifting 6 days a week with stretching as well as anti-inflammatories.  I think it is possible an injection would give him temporary relief.  He is more interested in what can be done for a long-term "fix".  I think this is more likely to be a management problem of arthritis versus a "fix".  He will likely at some point needs shoulder replacement in the  future but that is not really compatible with his desired level of weightlifting activities at the current time.  Follow-up after the MRI scan.  Follow-Up Instructions: No follow-ups on file.   Orders:  Orders Placed This Encounter  Procedures   XR Shoulder Left   MR Shoulder Left w/ contrast   Arthrogram   No orders of the defined types were placed in this encounter.     Procedures: No procedures performed   Clinical Data: No additional findings.  Objective: Vital Signs: There were no vitals taken for this visit.  Physical Exam:  Constitutional: Patient appears well-developed HEENT:  Head: Normocephalic Eyes:EOM are normal Neck: Normal range of motion Cardiovascular: Normal rate Pulmonary/chest: Effort normal Neurologic: Patient is alert Skin: Skin is warm Psychiatric: Patient has normal mood and affect  Ortho Exam: Ortho exam demonstrates excellent rotator cuff strength on the left infraspinatus supraspinatus and subscap muscle testing.  Range of motion on the right is 60/100/165 range of motion on the left is 40/90/150.  Does have a little bit of coarseness with passive range of motion on the left with positive bicipital groove tenderness and positive radiation to the biceps with no discrete AC joint tenderness left versus right and positive O'Brien's testing on the left negative on the right.  Specialty Comments:  No specialty comments  available.  Imaging: XR Shoulder Left  Result Date: 08/27/2023 AP lateral radiograph left shoulder reviewed.  End-stage glenohumeral arthritis is present with maintenance of acromiohumeral distance maintained.  Humeral head osteophyte.  No acute fracture.  Shoulder is located    PMFS History: Patient Active Problem List   Diagnosis Date Noted   Complete tear of right rotator cuff    Degenerative superior labral anterior-to-posterior (SLAP) tear of right shoulder    Shoulder arthritis    Chronic right shoulder pain 08/31/2021    History of arthroscopy of left shoulder 12/21/2016   GERD (gastroesophageal reflux disease) 05/25/2015   Agatston coronary artery calcium score less than 100 02/08/2015   ED (erectile dysfunction) 02/08/2015   Pulmonary nodules 02/08/2015   Dilated aortic root (HCC) 02/08/2015   Chest pain 08/24/2011   Hyperlipidemia 08/24/2011   Elevated BP 08/24/2011   History of palpitations 12/16/2010   Past Medical History:  Diagnosis Date   GERD (gastroesophageal reflux disease)    Hyperlipidemia    Palpitations     Family History  Adopted: Yes  Problem Relation Age of Onset   Heart attack Paternal Grandfather     Past Surgical History:  Procedure Laterality Date   bicep re-attachment     NASAL SINUS SURGERY     shoulder arthroscopy Left 2018   SHOULDER ARTHROSCOPY Right 08/26/2020   SHOULDER ARTHROSCOPY WITH DEBRIDEMENT AND BICEP TENDON REPAIR Right 10/18/2021   Procedure: RIGHT SHOULDER ARTHROSCOPY, DEBRIDEMENT, BICEPS TENODESIS;  Surgeon: Cammy Copa, MD;  Location: [facility] OR;  Service: Orthopedics;  Laterality: Right;   Social History   Occupational History   Occupation: Fireman    CommentLexicographer  Tobacco Use   Smoking status: Never   Smokeless tobacco: Never  Vaping Use   Vaping status: Never Used  Substance and Sexual Activity   Alcohol use: No   Drug use: No   Sexual activity: Not on file

## 2023-09-05 DIAGNOSIS — Z23 Encounter for immunization: Secondary | ICD-10-CM | POA: Diagnosis not present

## 2023-09-05 DIAGNOSIS — R7303 Prediabetes: Secondary | ICD-10-CM | POA: Diagnosis not present

## 2023-09-05 DIAGNOSIS — E291 Testicular hypofunction: Secondary | ICD-10-CM | POA: Diagnosis not present

## 2023-09-05 DIAGNOSIS — F411 Generalized anxiety disorder: Secondary | ICD-10-CM | POA: Diagnosis not present

## 2023-09-05 DIAGNOSIS — D751 Secondary polycythemia: Secondary | ICD-10-CM | POA: Diagnosis not present

## 2023-09-05 DIAGNOSIS — Z125 Encounter for screening for malignant neoplasm of prostate: Secondary | ICD-10-CM | POA: Diagnosis not present

## 2023-09-05 DIAGNOSIS — Z0000 Encounter for general adult medical examination without abnormal findings: Secondary | ICD-10-CM | POA: Diagnosis not present

## 2023-09-05 DIAGNOSIS — E782 Mixed hyperlipidemia: Secondary | ICD-10-CM | POA: Diagnosis not present

## 2023-09-10 DIAGNOSIS — Z85828 Personal history of other malignant neoplasm of skin: Secondary | ICD-10-CM | POA: Diagnosis not present

## 2023-09-10 DIAGNOSIS — I788 Other diseases of capillaries: Secondary | ICD-10-CM | POA: Diagnosis not present

## 2023-09-10 DIAGNOSIS — L812 Freckles: Secondary | ICD-10-CM | POA: Diagnosis not present

## 2023-09-10 DIAGNOSIS — D1801 Hemangioma of skin and subcutaneous tissue: Secondary | ICD-10-CM | POA: Diagnosis not present

## 2023-09-10 DIAGNOSIS — L820 Inflamed seborrheic keratosis: Secondary | ICD-10-CM | POA: Diagnosis not present

## 2023-09-10 DIAGNOSIS — C44319 Basal cell carcinoma of skin of other parts of face: Secondary | ICD-10-CM | POA: Diagnosis not present

## 2023-09-10 DIAGNOSIS — L570 Actinic keratosis: Secondary | ICD-10-CM | POA: Diagnosis not present

## 2023-09-24 ENCOUNTER — Ambulatory Visit: Payer: Medicare Other | Admitting: Podiatry

## 2023-09-24 DIAGNOSIS — K080 Exfoliation of teeth due to systemic causes: Secondary | ICD-10-CM | POA: Diagnosis not present

## 2023-09-25 ENCOUNTER — Ambulatory Visit: Payer: Medicare Other | Admitting: Podiatry

## 2023-09-25 DIAGNOSIS — M722 Plantar fascial fibromatosis: Secondary | ICD-10-CM

## 2023-09-25 NOTE — Progress Notes (Unsigned)
   Chief Complaint  Patient presents with   Foot Pain    Right foot, he reports orthotics have helped his foot and pain is a lot better.    HPI: 67 y.o. male presents today for f/u of right plantar fasciitis.  Notes he is having minimal to no pain right now.  His orthotics were recently adjusted and he felt this made a big improvement.  He has an appt on Thursday with our pedorthist and plans to ask her if the orthotic can be adjust a little bit more in the arch, closer to the heel area, to be brought up a little higher.  He is also interested in getting another pair of orthotics, but was clear he does not plan on paying the same amount of money for the 2nd pair.  He also wanted an update on our billing office "resolving" his bill he received, as he feels he doesn't owe anything.  He'd like a refund check sent to him.    Past Medical History:  Diagnosis Date   GERD (gastroesophageal reflux disease)    Hyperlipidemia    Palpitations     Past Surgical History:  Procedure Laterality Date   bicep re-attachment     NASAL SINUS SURGERY     shoulder arthroscopy Left 2018   SHOULDER ARTHROSCOPY Right 08/26/2020   SHOULDER ARTHROSCOPY WITH DEBRIDEMENT AND BICEP TENDON REPAIR Right 10/18/2021   Procedure: RIGHT SHOULDER ARTHROSCOPY, DEBRIDEMENT, BICEPS TENODESIS;  Surgeon: Cammy Copa, MD;  Location: [facility] OR;  Service: Orthopedics;  Laterality: Right;    No Known Allergies   Physical Exam: Palpable pedal pulses.  No edema.  No pain on palpation right heel.  Ankle df improving.  Assessment/Plan of Care: 1. Plantar fasciitis of right foot     Discussed clinical findings with patient today.  Continue with stretching and wearing of orthotics.  Will cc our pedorthist on this note today so that she is aware of his concerns/requests when seen on Thursday.  F/u prn    Clerance Lav, DPM, FACFAS [facility]     [address] [place], [place] [address]                Office [phone]  Fax [phone]

## 2023-09-26 ENCOUNTER — Inpatient Hospital Stay
Admission: RE | Admit: 2023-09-26 | Discharge: 2023-09-26 | Disposition: A | Discharge status: Home / Self Care | Payer: Medicare Other | Source: Ambulatory Visit | Attending: Orthopedic Surgery | Admitting: Orthopedic Surgery

## 2023-09-26 ENCOUNTER — Ambulatory Visit
Admission: RE | Admit: 2023-09-26 | Discharge: 2023-09-26 | Disposition: A | Discharge status: Home / Self Care | Payer: Medicare Other | Source: Ambulatory Visit | Attending: Orthopedic Surgery | Admitting: Orthopedic Surgery

## 2023-09-26 DIAGNOSIS — M19012 Primary osteoarthritis, left shoulder: Secondary | ICD-10-CM | POA: Diagnosis not present

## 2023-09-26 DIAGNOSIS — M7582 Other shoulder lesions, left shoulder: Secondary | ICD-10-CM | POA: Diagnosis not present

## 2023-09-26 DIAGNOSIS — M25512 Pain in left shoulder: Secondary | ICD-10-CM

## 2023-09-26 DIAGNOSIS — M7532 Calcific tendinitis of left shoulder: Secondary | ICD-10-CM | POA: Diagnosis not present

## 2023-09-26 MED ORDER — IOPAMIDOL (ISOVUE-M 200) INJECTION 41%
10.0000 mL | Freq: Once | INTRAMUSCULAR | Status: AC
  Administered 2023-09-26: 10 mL via INTRA_ARTICULAR

## 2023-09-27 ENCOUNTER — Ambulatory Visit: Payer: Medicare Other

## 2023-09-27 ENCOUNTER — Encounter: Payer: Self-pay | Admitting: Pharmacist

## 2023-09-27 NOTE — Progress Notes (Signed)
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

## 2023-10-01 DIAGNOSIS — K219 Gastro-esophageal reflux disease without esophagitis: Secondary | ICD-10-CM | POA: Diagnosis not present

## 2023-10-01 DIAGNOSIS — R140 Abdominal distension (gaseous): Secondary | ICD-10-CM | POA: Diagnosis not present

## 2023-10-03 ENCOUNTER — Other Ambulatory Visit (INDEPENDENT_AMBULATORY_CARE_PROVIDER_SITE_OTHER): Payer: Medicare Other

## 2023-10-03 ENCOUNTER — Ambulatory Visit: Payer: Medicare Other | Admitting: Orthopedic Surgery

## 2023-10-03 DIAGNOSIS — M25532 Pain in left wrist: Secondary | ICD-10-CM

## 2023-10-03 DIAGNOSIS — M25531 Pain in right wrist: Secondary | ICD-10-CM | POA: Diagnosis not present

## 2023-10-04 ENCOUNTER — Encounter: Payer: Self-pay | Admitting: Orthopedic Surgery

## 2023-10-04 NOTE — Progress Notes (Signed)
Office Visit Note   Patient: Jim Graham           Date of Birth: Mar 13, 1956           MRN: [id number] Visit Date: 10/03/2023 Requested by: Lupita Raider, MD [address] [place],  [place] [address] PCP: Lupita Raider, MD  Subjective: Chief Complaint  Patient presents with   Other    Review MRI     HPI: Jim Graham is a 67 y.o. male who presents to the office reporting left shoulder pain.  Since he was last seen has had an MRI scan of the left shoulder which does show moderate arthritis in the glenohumeral joint along with biceps tendinitis and rotator cuff tear involving the posterior superior rotator cuff.  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  Celebrex helps his wrist pain which she is having as well.  This is primarily symptomatic with weightlifting and dorsiflexion of the wrist.  Denies any history of injury.  He does report some mechanical symptoms in both wrists.  However most of his symptoms are pain related in the wrist joint.  He has tried conservative measures such as taping activity modification as well as oral anti-inflammatories for the wrist pain which have not alleviated his symptoms.  It does interfere with his work and recreation.  The left shoulder is also painful for him.  Describes weakness as well as mechanical symptoms in the shoulder.  He is happy with his right shoulder surgical result which involved cuff repair and biceps tenodesis.  The arthritis and cuff pathology is more severe on the left-hand side..                ROS: All systems reviewed are negative as they relate to the chief complaint within the history of present illness.  Patient denies fevers or chills.  Assessment & Plan: Visit Diagnoses:  1. 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  Both wrists are painful.  He has failed conservative management and symptoms ongoing now for well over 2 months.  Plan MRI of both wrist to evaluate for occult TFCC tear versus nonradiographic arthritis.  Follow-up after those studies.  Regarding the shoulder he does have rotator cuff and biceps tendon pathology in the face of more significant arthritis in the glenohumeral joint.  I did tell Jim Graham that surgical treatment of the soft tissue pathology in the shoulder would likely not be as successful for pain relief as it was in the right shoulder because of the worst arthritis present in the left.  Nonetheless [name] wants to have a chance at remaining as active as possible.  For that reason repair of the rotator cuff tear full-thickness tear is indicated as well as biceps tenodesis which is what he had on his other shoulder.  The risk and benefits are discussed with the patient including not limited to infection nerve vessel damage incomplete pain weak as well as incomplete functional restoration.  Expected nature of the rehabilitative process is also discussed.  All questions answered.  Follow-Up Instructions: No follow-ups on file.   Orders:  Orders Placed This Encounter  Procedures   XR Wrist Complete Right   XR Wrist Complete Left   MR Wrist Right w/o contrast  MR Wrist Left w/o contrast   No orders of the defined types were placed in this encounter.     Procedures: No procedures performed   Clinical Data: No additional findings.  Objective: Vital Signs: There were no vitals taken for this visit.  Physical Exam:  Constitutional: Patient appears well-developed HEENT:  Head: Normocephalic Eyes:EOM are normal Neck: Normal range of motion Cardiovascular: Normal rate Pulmonary/chest: Effort normal Neurologic: Patient is alert Skin: Skin is warm Psychiatric: Patient has normal mood and affect  Ortho Exam: Ortho exam demonstrates mild effusion in both wrist.  EPL  FPL interosseous strength is intact.  No DRUJ instability is present.  I cannot elicit any clicking or popping with provocative maneuvers to test the TFCC.  No snuffbox tenderness on either side and negative Finkelstein's test.  Does have slight restriction of flexion in both wrist to about 50 degrees and extension to around 45 degrees.  Specialty Comments:  No specialty comments available.  Imaging: No results found.   PMFS History: Patient Active Problem List   Diagnosis Date Noted   Complete tear of right rotator cuff    Degenerative superior labral anterior-to-posterior (SLAP) tear of right shoulder    Shoulder arthritis    Chronic right shoulder pain 08/31/2021   History of arthroscopy of left shoulder 12/21/2016   GERD (gastroesophageal reflux disease) 05/25/2015   Agatston coronary artery calcium score less than 100 02/08/2015   ED (erectile dysfunction) 02/08/2015   Pulmonary nodules 02/08/2015   Dilated aortic root (HCC) 02/08/2015   Chest pain 08/24/2011   Hyperlipidemia 08/24/2011   Elevated BP 08/24/2011   History of palpitations 12/16/2010   Past Medical History:  Diagnosis Date   GERD (gastroesophageal reflux disease)    Hyperlipidemia    Palpitations     Family History  Adopted: Yes  Problem Relation Age of Onset   Heart attack Paternal Grandfather     Past Surgical History:  Procedure Laterality Date   bicep re-attachment     NASAL SINUS SURGERY     shoulder arthroscopy Left 2018   SHOULDER ARTHROSCOPY Right 08/26/2020   SHOULDER ARTHROSCOPY WITH DEBRIDEMENT AND BICEP TENDON REPAIR Right 10/18/2021   Procedure: RIGHT SHOULDER ARTHROSCOPY, DEBRIDEMENT, BICEPS TENODESIS;  Surgeon: Cammy Copa, MD;  Location: [facility] OR;  Service: Orthopedics;  Laterality: Right;   Social History   Occupational History   Occupation: Fireman    CommentLexicographer  Tobacco Use   Smoking status: Never   Smokeless tobacco: Never  Vaping Use   Vaping status: Never  Used  Substance and Sexual Activity   Alcohol use: No   Drug use: No   Sexual activity: Not on file

## 2023-10-08 DIAGNOSIS — R945 Abnormal results of liver function studies: Secondary | ICD-10-CM | POA: Diagnosis not present

## 2023-10-16 ENCOUNTER — Encounter: Payer: Self-pay | Admitting: Orthopedic Surgery

## 2023-10-20 ENCOUNTER — Ambulatory Visit
Admission: RE | Admit: 2023-10-20 | Discharge: 2023-10-20 | Disposition: A | Discharge status: Home / Self Care | Payer: Medicare Other | Source: Ambulatory Visit | Attending: Orthopedic Surgery | Admitting: Orthopedic Surgery

## 2023-10-20 DIAGNOSIS — M25531 Pain in right wrist: Secondary | ICD-10-CM

## 2023-10-22 ENCOUNTER — Ambulatory Visit: Payer: Medicare Other

## 2023-10-22 DIAGNOSIS — J343 Hypertrophy of nasal turbinates: Secondary | ICD-10-CM | POA: Diagnosis not present

## 2023-10-22 DIAGNOSIS — J34829 Nasal valve collapse, unspecified: Secondary | ICD-10-CM | POA: Diagnosis not present

## 2023-10-22 NOTE — Progress Notes (Signed)
 Patient is happy with new pr of orthotics that we have added arch p[ads to I will send last pair to [facility] to have arch pads added as well patient can PU when in  Addison Bailey CPed, CFo, CFm

## 2023-10-23 ENCOUNTER — Ambulatory Visit: Payer: Medicare Other | Attending: Internal Medicine | Admitting: Internal Medicine

## 2023-10-23 ENCOUNTER — Encounter: Payer: Self-pay | Admitting: Internal Medicine

## 2023-10-23 VITALS — BP 126/88 | HR 84 | Ht 70.0 in | Wt 239.8 lb

## 2023-10-23 DIAGNOSIS — I77810 Thoracic aortic ectasia: Secondary | ICD-10-CM

## 2023-10-23 DIAGNOSIS — I2510 Atherosclerotic heart disease of native coronary artery without angina pectoris: Secondary | ICD-10-CM | POA: Diagnosis not present

## 2023-10-23 DIAGNOSIS — E785 Hyperlipidemia, unspecified: Secondary | ICD-10-CM | POA: Diagnosis not present

## 2023-10-23 NOTE — Patient Instructions (Addendum)
 Medication Instructions:  NONE   *If you need a refill on your cardiac medications before your next appointment, please call your pharmacy*   Lab Work: NONE  Testing/Procedures: Schedule CT Angio Chest Aorta   Follow-Up: At [facility], you and your health needs are our priority.  As part of our continuing mission to provide you with exceptional heart care, we have created designated Provider Care Teams.  These Care Teams include your primary Cardiologist (physician) and Advanced Practice Providers (APPs -  Physician Assistants and Nurse Practitioners) who all work together to provide you with the care you need, when you need it.  We recommend signing up for the patient portal called MyChart.  Sign up information is provided on this After Visit Summary.  MyChart is used to connect with patients for Virtual Visits (Telemedicine).  Patients are able to view lab/test results, encounter notes, upcoming appointments, etc.  Non-urgent messages can be sent to your provider as well.   To learn more about what you can do with MyChart, go to [email or web address].    Your next appointment:   1 year  Provider:   Dr. Mona or APP

## 2023-10-23 NOTE — Progress Notes (Signed)
 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 He is also on chronic suppressive therapy for HSV. He was praised evaluated for atypical chest pain and palpitations in 2012. He underwent an exercise echocardiogram which was negative for ischemia. He also wore monitor for. At time. Symptoms improved and were related to stress. He does not know his medical history secondary to being adopted. Recently he's had more discomfort mostly in his left shoulder and some in the left upper chest but no evidence for substernal chest pain. He's also been having worsening symptoms which she attributes to reflux. He was switched from Nexium to Dexilant. In addition, he notes that when he takes Zantac he has marked improvement in his symptoms. 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  The pleasure see Jim Graham back in the office today. Overall he is doing well denies any further chest pain or shortness of breath. In fact she's managed to lose about 20 pounds since his last office visit. He says he's on the [employer]. This is a combination of caloric restriction, hCG, other vitamins and appetite suppressants. He also continues to exercise. He had a lipid profile performed which showed total cholesterol 140, glyceride 66, HL 43, LDL 84. He's currently on Crestor  5 mg daily. The CT scan of his coronary Arteries was performed which demonstrated a low coronary artery calcium  score of 46, but was located in the proximal to mid LAD. The aortic root was mildly dilated 4.0 cm. An over read by [facility] radiology indicated several small subcentimeter pulmonary nodules, the largest being 5 mm. A repeat CT scan was  recommended in one year. He is not a smoker and at lower risk for bronchogenic carcinoma. In addition, today he reported some recent erectile dysfunction, which was unrelated to her follow-up. He is asking about Viagra .  Jim Graham returns today for follow-up. Over the weekend he had an episode where he developed pain on the left and right sides of his chest laterally that radiated down his arms. There is no central chest discomfort. It seemed to occurred fairly recently after eating. He did take a Zantac and went to the emergency room. He said he waited outside of the emergency room and his symptoms resolved and he ultimately was never seen. He then started taking his Dexilant again. He has noted some improvement in his symptoms but is not completely resolved. He says he recently saw his gastroenterologist but was not having any symptoms at the time. He continues to exercise at a significant rate without any chest pain or shortness of breath.  Jim Graham returns today for follow-up. He reports some occasional left shoulder pain but is able to exercise at a high rate without any chest pain or worsening shortness of breath. He exercises fairly regularly. He recently thought he was having side effects from his Crestor  discontinued that however the symptoms did not go away and he restarted his medicine. He is due for repeat lipid profile. He is also due for one year follow-up of his last chest CT which demonstrated several small sub-pulmonary nodules. There was also a mildly dilated aortic root to 4.0 cm.  04/23/2017  Jim Graham  was seen today in follow-up. He underwent a recent repeat CT which showed no evidence of aortic dilatation and no change in his small subpulmonary nodules. No further CT was recommended. He denies any chest pain or worsening shortness of breath. He continues to exercise regularly. 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 We  recently received labs from his primary care provider which shows a total cholesterol of 149, temperature glyceride 88, HDL-C 40, LDL-C 91 and non-HDL-C of 890. I review the results today including his 10 year and 63 year MESA and pulled cohort risk assessments given his coronary artery calcium . He is at 6.5 and 7.5% risk respectively based on those databases and has a long-term risk of about 36% of developing a hard cardiovascular event. With this in mind his LDL-C is a goal less than 899, however his non-HDL-C is at goal <130. We discussed that based on current data if he were to be very aggressive about his medical therapy he may want to try to drive his LDL-C less than 70 or at least get his non-HDL-C less than 100. We may accomplish this with a small increase in his Crestor .  06/07/2018  Jim Graham seen today in follow-up.  Overall seems to be doing well.  He denies any chest pain or shortness of breath.  He exercises regularly.  Recently gained some weight combination of both muscle mass and he feels like he may be more sedentary.  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  Recent labs in April 2019 showed total cholesterol 158, HDL 45, LDL 87 and triglycerides 132.  EKG sinus rhythm at 84.  04/28/2020  Jim Graham is seen today in follow-up.  He seems to be doing very well.  He is exercising more regularly.  He had gained some weight during the pandemic but is managed to get it off and generally is feeling much better.  His lipids have improved significantly.  LDL cholesterol is around 70.  EKG is normal.  His blood pressure is well controlled.  09/22/2022  Jim Graham is seen today in follow-up.  He is overall without complaints.  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  He did have prior dilation of the ascending aorta seen on a CT scan back in 2016 however subsequently this was not again  visualized.  His calcium  score at that time was 46 which was 56 percentile for age and sex matched controls.  Blood pressure has been well-controlled.  EKG today is normal with some nonspecific T wave changes.  He remains on low-dose aspirin and rosuvastatin .  Recent labs showed total cholesterol 138, HDL 42, triglycerides 103 and LDL 78.  10/23/2023  Jim Graham is seen today in follow-up.  Overall he is without complaints.  He denies any worsening chest pain or shortness of breath.  He does have some anxiety about his health.  Namely he has some concerns about the increase in his calcium  score that occurred over several years.  I did review that again with him.  His score again in January 2024 was 151 which was 62nd percentile.  The ascending aorta did measure larger at 43 mm.  I had recommended follow-up which is due this month.  I did recommend an increase in his statin up to 20 mg rosuvastatin .  His last lipid showed good control  with an LDL of 70, total cholesterol 133, triglycerides 116 and HDL 42.  This was in July but also repeat labs in November showed total cholesterol 140, HDL 40, triglycerides 124 and LDL 77.  He does exercise quite a bit including weight lifting.  On supplemental testosterone .  PMHx:  Past Medical History:  Diagnosis Date   GERD (gastroesophageal reflux disease)    Hyperlipidemia    Palpitations     Past Surgical History:  Procedure Laterality Date   bicep re-attachment     NASAL SINUS SURGERY     shoulder arthroscopy Left 2018   SHOULDER ARTHROSCOPY Right 08/26/2020   SHOULDER ARTHROSCOPY WITH DEBRIDEMENT AND BICEP TENDON REPAIR Right 10/18/2021   Procedure: RIGHT SHOULDER ARTHROSCOPY, DEBRIDEMENT, BICEPS TENODESIS;  Surgeon: Addie Cordella Hamilton, MD;  Location: [facility] OR;  Service: Orthopedics;  Laterality: Right;    FAMHx:  Family History  Adopted: Yes  Problem Relation Age of Onset   Heart attack Paternal Grandfather     SOCHx:   reports that he has never  smoked. He has never used smokeless tobacco. He reports that he does not drink alcohol and does not use drugs.  ALLERGIES:  Allergies  Allergen Reactions   Pravastatin     Other Reaction(s): leg pain   Simvastatin     Other Reaction(s): chest pain   Pitavastatin Palpitations    ROS: Pertinent items noted in HPI and remainder of comprehensive ROS otherwise negative.  HOME MEDS: Current Outpatient Medications  Medication Sig Dispense Refill   aspirin 81 MG tablet Take 81 mg by mouth daily.       buPROPion (WELLBUTRIN XL) 300 MG 24 hr tablet Take 300 mg by mouth as needed.     celecoxib  (CELEBREX ) 200 MG capsule Take 1 capsule (200 mg total) by mouth 2 (two) times daily as needed. 60 capsule 3   Coenzyme Q10 100 MG capsule Take 100 mg by mouth daily.     Multiple Vitamin (MULTIVITAMIN PO) Take 1 Package by mouth daily.     oxyCODONE -acetaminophen  (PERCOCET/ROXICET) 5-325 MG tablet Take 1 tablet by mouth every 4 (four) hours as needed for severe pain. 30 tablet 0   rosuvastatin  (CRESTOR ) 10 MG tablet TAKE 1 TABLET BY MOUTH AT 1PM AND 1 TAB AT DINNER. 15 tablet 0   SYRINGE-NEEDLE, DISP, 3 ML (B-D 3CC LUER-LOK SYR 23GX1) 23G X 1 3 ML MISC once a week.     tadalafil  (CIALIS ) 10 MG tablet Take 10 mg by mouth daily as needed.     testosterone  cypionate (DEPOTESTOSTERONE CYPIONATE) 200 MG/ML injection Inject 100 mg into the muscle every 7 (seven) days.     valACYclovir (VALTREX) 500 MG tablet Take 500 mg by mouth 2 (two) times daily.     esomeprazole (NEXIUM) 40 MG capsule Take 40 mg by mouth as needed. (Patient not taking: Reported on 10/23/2023)     methocarbamol  (ROBAXIN ) 500 MG tablet Take 1 tablet (500 mg total) by mouth every 8 (eight) hours as needed for muscle spasms. (Patient not taking: Reported on 10/23/2023) 60 tablet 0   ondansetron  (ZOFRAN ) 4 MG tablet Take 1 tablet (4 mg total) by mouth every 8 (eight) hours as needed for nausea or vomiting. (Patient not taking: Reported on  10/23/2023) 20 tablet 0   sildenafil  (REVATIO ) 20 MG tablet Take 20 mg by mouth as needed. (Patient not taking: Reported on 10/23/2023)     No current facility-administered medications for this visit.    LABS/IMAGING: No results found  for this or any previous visit (from the past 48 hours). No results found.  VITALS: BP 126/88 (BP Location: Left Arm, Patient Position: Sitting, Cuff Size: Large)   Pulse 84   Ht 5' 10 (1.778 m)   Wt 239 lb 12.8 oz (108.8 kg)   SpO2 96%   BMI 34.41 kg/m   EXAM: General appearance: alert and no distress Neck: no carotid bruit, no JVD and thyroid  not enlarged, symmetric, no tenderness/mass/nodules Lungs: clear to auscultation bilaterally Heart: regular rate and rhythm, S1, S2 normal, no murmur, click, rub or gallop Abdomen: soft, non-tender; bowel sounds normal; no masses,  no organomegaly Extremities: extremities normal, atraumatic, no cyanosis or edema Pulses: 2+ and symmetric Skin: Skin color, texture, turgor normal. No rashes or lesions Neurologic: Grossly normal Psych: Pleasant  EKG: EKG Interpretation Date/Time:  Tuesday October 23 2023 15:24:17 EST Ventricular Rate:  84 PR Interval:  174 QRS Duration:  88 QT Interval:  352 QTC Calculation: 415 R Axis:   24  Text Interpretation: Normal sinus rhythm Nonspecific T wave abnormality When compared with ECG of 12-Oct-2021 15:50, No significant change was found Confirmed by Mona Kent [phone]) on 10/23/2023 3:44:16 PM    ASSESSMENT: Atypical left upper chest/shoulder pain - low coronary calcium  score 46 (2016) -> increased to 151, 62nd percentile (10/2022) GERD Dyslipidemia Erectile dysfunction Mildly dilated aortic root to 4.0 cm > increased to 4.3 cm (10/2022) Subcentimeter pulmonary nodules - stable, do not require follow-up Low testosterone   PLAN: 1.   Mr. Hitchens continues to be asymptomatic and has well treated dyslipidemia.  He did have an interim increase in his calcium  score,  however this was over a 10-year period.  He is aorta was noted to be dilated.  He will need repeat imaging of that this month and we will order a repeat CT angiogram of the aorta.  Otherwise he continues to remain asymptomatic.  Cholesterol has risen some in the past few months.  I encouraged him to continue to work with diet low in saturated fats.  Follow-up with me annually or sooner as necessary.  Kent KYM Mona, MD, [facility], FACP  [place]  [facility]  Medical Director of the [facility] Lipid [facility] &  Cardiovascular Risk Reduction Clinic Diplomate of the American Board of Clinical Lipidology Attending Cardiologist  Direct Dial: [phone]  Fax: [phone]  Website:  [email or web address] Kent JAYSON Mona 10/23/2023, 3:44 PM

## 2023-10-25 ENCOUNTER — Telehealth: Payer: Self-pay | Admitting: Orthopedic Surgery

## 2023-10-25 NOTE — Telephone Encounter (Signed)
 Can you see if you can get those guys to read the wrist MRIs.  I do not think they have been read yet.  Can call after that.

## 2023-10-25 NOTE — Telephone Encounter (Signed)
 Patient request a call with MRI results. Appt on 10/26/23 was cancelled due to office closing early. Please advise

## 2023-10-26 ENCOUNTER — Ambulatory Visit: Payer: Medicare Other | Admitting: Surgical

## 2023-10-26 NOTE — Telephone Encounter (Signed)
 Called [place] [facility], moving the imaging to to STAT to be read

## 2023-10-29 NOTE — Telephone Encounter (Signed)
 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  He wants to get shoulder surgery which we will schedule

## 2023-10-30 ENCOUNTER — Other Ambulatory Visit: Payer: Self-pay

## 2023-10-30 DIAGNOSIS — M25531 Pain in right wrist: Secondary | ICD-10-CM

## 2023-10-30 NOTE — Telephone Encounter (Signed)
 Referral placed.

## 2023-10-31 DIAGNOSIS — C44319 Basal cell carcinoma of skin of other parts of face: Secondary | ICD-10-CM | POA: Diagnosis not present

## 2023-11-05 ENCOUNTER — Other Ambulatory Visit: Payer: Medicare Other

## 2023-11-07 DIAGNOSIS — L570 Actinic keratosis: Secondary | ICD-10-CM | POA: Diagnosis not present

## 2023-11-09 ENCOUNTER — Ambulatory Visit (HOSPITAL_BASED_OUTPATIENT_CLINIC_OR_DEPARTMENT_OTHER)
Admission: RE | Admit: 2023-11-09 | Discharge: 2023-11-09 | Disposition: A | Discharge status: Home / Self Care | Payer: Medicare Other | Source: Ambulatory Visit | Attending: Internal Medicine | Admitting: Internal Medicine

## 2023-11-09 DIAGNOSIS — I77810 Thoracic aortic ectasia: Secondary | ICD-10-CM | POA: Insufficient documentation

## 2023-11-09 DIAGNOSIS — I7121 Aneurysm of the ascending aorta, without rupture: Secondary | ICD-10-CM | POA: Diagnosis not present

## 2023-11-09 MED ORDER — IOHEXOL 350 MG/ML SOLN
75.0000 mL | Freq: Once | INTRAVENOUS | Status: AC | PRN
  Administered 2023-11-09: 75 mL via INTRAVENOUS

## 2023-11-12 ENCOUNTER — Other Ambulatory Visit (HOSPITAL_BASED_OUTPATIENT_CLINIC_OR_DEPARTMENT_OTHER): Payer: Self-pay | Admitting: *Deleted

## 2023-11-12 DIAGNOSIS — I77810 Thoracic aortic ectasia: Secondary | ICD-10-CM

## 2023-11-13 DIAGNOSIS — Z9889 Other specified postprocedural states: Secondary | ICD-10-CM | POA: Diagnosis not present

## 2023-11-13 DIAGNOSIS — J34829 Nasal valve collapse, unspecified: Secondary | ICD-10-CM | POA: Diagnosis not present

## 2023-11-21 ENCOUNTER — Ambulatory Visit: Payer: Medicare Other | Admitting: Orthopedic Surgery

## 2023-11-21 DIAGNOSIS — M25531 Pain in right wrist: Secondary | ICD-10-CM | POA: Diagnosis not present

## 2023-11-21 DIAGNOSIS — M25532 Pain in left wrist: Secondary | ICD-10-CM

## 2023-11-21 MED ORDER — LIDOCAINE HCL 1 % IJ SOLN
1.0000 mL | INTRAMUSCULAR | Status: AC | PRN
  Administered 2023-11-21: 1 mL

## 2023-11-21 MED ORDER — BETAMETHASONE SOD PHOS & ACET 6 (3-3) MG/ML IJ SUSP
6.0000 mg | INTRAMUSCULAR | Status: AC | PRN
  Administered 2023-11-21: 6 mg via INTRA_ARTICULAR

## 2023-11-21 NOTE — Progress Notes (Signed)
 Jim Graham - 68 y.o. male MRN [id number]  Date of birth: 07-Mar-1956  Office Visit Note: Visit Date: 11/21/2023 PCP: Loreli Kins, MD Referred by: Addie Cordella Hamilton, MD  Subjective: No chief complaint on file.  HPI: Jim Graham is a pleasant 68 y.o. male who presents today for evaluation of bilateral wrist pain, right greater than left that has been present now for multiple months.  States that the pain is worse with loading activity of the wrist and with heavy grip.  He does remain quite active at baseline, does CrossFit on a regular basis and does heavy weightlifting regularly.  Has not undergone any formalized treatment for the bilateral wrists in the past.  Pertinent ROS were reviewed with the patient and found to be negative unless otherwise specified above in HPI.   Visit Reason: bilateral wrists  Duration of symptoms: July 2024 Hand dominance: right Occupation: Retired Diabetic: No Smoking: No Heart/Lung History: none Blood Thinners: none  Prior Testing/EMG: MRI 10/26/23 Injections (Date): none Treatments: none Prior Surgery:none  Assessment & Plan: Visit Diagnoses: No diagnosis found.  Plan: Extensive discussion was had with the patient today regarding his bilateral wrist complaints.  I reviewed the results of his previous x-ray and MRI findings of the bilateral wrist.    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  There is also evidence of slight ulnar negative variance which may be a contributing factor.  We discussed the underlying pathophysiology of this condition as well as treatment modalities.  Given his early stage disease, I did discuss that activity modification, immobilization, NSAIDs and potential injections may help alleviate symptoms.  I explained that this can likely progress with time given the poor  vascularity of the bone and the ulnar negative variance at the wrist.  We discussed potential treatment options from radius core decompression to partial fusion and finally salvage procedure such as proximal row carpectomy should the arthritis or collapse become more progressive in the future.  The MRI also did show some TFCC tearing which is likely degenerative in nature, on examination today he does have some mild tenderness in this region however his pain is isolated mostly to the radiocarpal region.  As for the left wrist, there is some ongoing pain in this region without significant lunate necrosis.  His MRI did show some TFCC injury which is likely degenerative in nature.  His examination is fairly benign at the left wrist today, pain is isolated mostly to the radiocarpal region.  Once again we discussed activity modification, immobilization, NSAIDs and potential injections may help alleviate symptoms.   After discussion and consideration, patient elected to proceed with bilateral cortisone injections to the wrist radiocarpal joint today for symptom relief.  Injections were tolerated appropriately.  Risks and benefits of the injection were discussed in detail.  Patient will return to me in approximate 3 months time for recheck of his ongoing symptoms and further discussion.  I also discussed this patient with Dr. Addie who is currently seeing him for his left shoulder rotator cuff tear in order to appropriately coordinate his care moving forward.  Greater than 45 minutes was spent in the care of this patient reviewing previous imaging, documentation as well as in examination and discussion with patient regarding treatment.   Follow-up: No follow-ups on file.   Meds & Orders:  No orders of the defined types were placed in this encounter.  No orders of the defined types were placed in this encounter.    Procedures: Medium Joint Inj: bilateral radiocarpal on 11/21/2023 11:25 AM Details: 25 G  needle, dorsal approach Medications (Right): 1 mL lidocaine  1 %; 6 mg betamethasone  acetate-betamethasone  sodium phosphate  6 (3-3) MG/ML Medications (Left): 1 mL lidocaine  1 %; 6 mg betamethasone  acetate-betamethasone  sodium phosphate  6 (3-3) MG/ML Outcome: tolerated well, no immediate complications Procedure, treatment alternatives, risks and benefits explained, specific risks discussed.          Clinical History: No specialty comments available.  He reports that he has never smoked. He has never used smokeless tobacco. No results for input(s): HGBA1C, LABURIC in the last 8760 hours.  Objective:   Vital Signs: There were no vitals taken for this visit.  Physical Exam  Gen: Well-appearing, in no acute distress; non-toxic CV: Regular Rate. Well-perfused. Warm.  Resp: Breathing unlabored on room air; no wheezing. Psych: Fluid speech in conversation; appropriate affect; normal thought process  Ortho Exam PHYSICAL EXAM:  General: Patient is well appearing and in no distress. Cervical spine mobility is full in all directions:  Skin and Muscle: No significant skin changes are apparent to upper extremities.  Muscle bulk and contour normal, no signs of atrophy.     Range of Motion and Palpation Tests: Mobility is full about the elbows with flexion and extension.  Forearm supination and pronation are 85/85 bilaterally.  Wrist flexion/extension is 75/65 bilaterally.  Digital flexion and extension are full.  Thumb opposition is full to the base of the small fingers bilaterally.    No cords or nodules are palpated.  No triggering is observed.    Moderate tenderness at the radiocarpal joint is appreciated, right greater than left.  No significant instability appreciated with scaphoid shift testing bilaterally.  There is evidence of positive foveal pain bilaterally at the TFCC region, right greater than left without evidence of instability.  DRUJ remains stable in all planes bilaterally.   No evidence of radiocarpal, midcarpal or intercarpal joint instability with provocation.   Neurologic, Vascular, Motor: Sensation is intact to light touch in the median/radial/ulnar distributions.  Fingers pink and well perfused.  Capillary refill is brisk.      No results found for: HGBA1C   Imaging: Prior x-rays and MRIs of bilateral wrist were reviewed in detail  Past Medical/Family/Surgical/Social History: Medications & Allergies reviewed per EMR, new medications updated. Patient Active Problem List   Diagnosis Date Noted   Complete tear of right rotator cuff    Degenerative superior labral anterior-to-posterior (SLAP) tear of right shoulder    Shoulder arthritis    Chronic right shoulder pain 08/31/2021   History of arthroscopy of left shoulder 12/21/2016   GERD (gastroesophageal reflux disease) 05/25/2015   Agatston coronary artery calcium  score less than 100 02/08/2015   ED (erectile dysfunction) 02/08/2015   Pulmonary nodules 02/08/2015   Dilated aortic root (HCC) 02/08/2015   Chest pain 08/24/2011   Hyperlipidemia 08/24/2011   Elevated BP 08/24/2011   History of palpitations 12/16/2010   Past Medical History:  Diagnosis Date   GERD (gastroesophageal reflux disease)    Hyperlipidemia    Palpitations    Family History  Adopted: Yes  Problem Relation Age of Onset   Heart attack Paternal Grandfather    Past Surgical History:  Procedure Laterality Date   bicep re-attachment     NASAL SINUS SURGERY     shoulder arthroscopy  Left 2018   SHOULDER ARTHROSCOPY Right 08/26/2020   SHOULDER ARTHROSCOPY WITH DEBRIDEMENT AND BICEP TENDON REPAIR Right 10/18/2021   Procedure: RIGHT SHOULDER ARTHROSCOPY, DEBRIDEMENT, BICEPS TENODESIS;  Surgeon: Addie Cordella Hamilton, MD;  Location: [facility] OR;  Service: Orthopedics;  Laterality: Right;   Social History   Occupational History   Occupation: Fireman    CommentLexicographer  Tobacco Use   Smoking status: Never   Smokeless  tobacco: Never  Vaping Use   Vaping status: Never Used  Substance and Sexual Activity   Alcohol use: No   Drug use: No   Sexual activity: Not on file    Davy Faught Afton Alderton, M.D. [facility] 11:18 AM

## 2023-11-23 DIAGNOSIS — J34829 Nasal valve collapse, unspecified: Secondary | ICD-10-CM | POA: Diagnosis not present

## 2023-11-23 DIAGNOSIS — J343 Hypertrophy of nasal turbinates: Secondary | ICD-10-CM | POA: Diagnosis not present

## 2023-11-23 DIAGNOSIS — Z9889 Other specified postprocedural states: Secondary | ICD-10-CM | POA: Diagnosis not present

## 2023-12-01 DIAGNOSIS — H109 Unspecified conjunctivitis: Secondary | ICD-10-CM | POA: Diagnosis not present

## 2023-12-04 DIAGNOSIS — F411 Generalized anxiety disorder: Secondary | ICD-10-CM | POA: Diagnosis not present

## 2023-12-04 DIAGNOSIS — E291 Testicular hypofunction: Secondary | ICD-10-CM | POA: Diagnosis not present

## 2023-12-04 DIAGNOSIS — R7303 Prediabetes: Secondary | ICD-10-CM | POA: Diagnosis not present

## 2023-12-04 DIAGNOSIS — E782 Mixed hyperlipidemia: Secondary | ICD-10-CM | POA: Diagnosis not present

## 2023-12-05 ENCOUNTER — Ambulatory Visit (INDEPENDENT_AMBULATORY_CARE_PROVIDER_SITE_OTHER): Payer: Medicare Other | Admitting: Orthopedic Surgery

## 2023-12-05 DIAGNOSIS — M25512 Pain in left shoulder: Secondary | ICD-10-CM | POA: Diagnosis not present

## 2023-12-05 NOTE — Progress Notes (Unsigned)
   Office Visit Note   Patient: Jim Graham           Date of Birth: 1956/05/25           MRN: [id number] Visit Date: 12/05/2023 Requested by: Lupita Raider, MD [address] [place],  [place] [address] PCP: Lupita Raider, MD  Subjective: Chief Complaint  Patient presents with  . Left Shoulder - Pain    HPI: Jim Graham is a 68 y.o. male who presents to the office reporting ***.                ROS: All systems reviewed are negative as they relate to the chief complaint within the history of present illness.  Patient denies fevers or chills.  Assessment & Plan: Visit Diagnoses:  1. Left shoulder pain, unspecified chronicity     Plan: ***  Follow-Up Instructions: No follow-ups on file.   Orders:  No orders of the defined types were placed in this encounter.  No orders of the defined types were placed in this encounter.     Procedures: No procedures performed   Clinical Data: No additional findings.  Objective: Vital Signs: There were no vitals taken for this visit.  Physical Exam:  Constitutional: Patient appears well-developed HEENT:  Head: Normocephalic Eyes:EOM are normal Neck: Normal range of motion Cardiovascular: Normal rate Pulmonary/chest: Effort normal Neurologic: Patient is alert Skin: Skin is warm Psychiatric: Patient has normal mood and affect  Ortho Exam: ***  Specialty Comments:  No specialty comments available.  Imaging: No results found.   PMFS History: Patient Active Problem List   Diagnosis Date Noted  . Complete tear of right rotator cuff   . Degenerative superior labral anterior-to-posterior (SLAP) tear of right shoulder   . Shoulder arthritis   . Chronic right shoulder pain 08/31/2021  . History of arthroscopy of left shoulder 12/21/2016  . GERD (gastroesophageal reflux disease) 05/25/2015  . Agatston coronary artery calcium score less than 100 02/08/2015  . ED (erectile dysfunction) 02/08/2015  .  Pulmonary nodules 02/08/2015  . Dilated aortic root (HCC) 02/08/2015  . Chest pain 08/24/2011  . Hyperlipidemia 08/24/2011  . Elevated BP 08/24/2011  . History of palpitations 12/16/2010   Past Medical History:  Diagnosis Date  . GERD (gastroesophageal reflux disease)   . Hyperlipidemia   . Palpitations     Family History  Adopted: Yes  Problem Relation Age of Onset  . Heart attack Paternal Grandfather     Past Surgical History:  Procedure Laterality Date  . bicep re-attachment    . NASAL SINUS SURGERY    . shoulder arthroscopy Left 2018  . SHOULDER ARTHROSCOPY Right 08/26/2020  . SHOULDER ARTHROSCOPY WITH DEBRIDEMENT AND BICEP TENDON REPAIR Right 10/18/2021   Procedure: RIGHT SHOULDER ARTHROSCOPY, DEBRIDEMENT, BICEPS TENODESIS;  Surgeon: Cammy Copa, MD;  Location: [facility] OR;  Service: Orthopedics;  Laterality: Right;   Social History   Occupational History  . Occupation: Fireman    CommentLexicographer  Tobacco Use  . Smoking status: Never  . Smokeless tobacco: Never  Vaping Use  . Vaping status: Never Used  Substance and Sexual Activity  . Alcohol use: No  . Drug use: No  . Sexual activity: Not on file

## 2023-12-06 ENCOUNTER — Encounter: Payer: Self-pay | Admitting: Orthopedic Surgery

## 2023-12-06 ENCOUNTER — Encounter (INDEPENDENT_AMBULATORY_CARE_PROVIDER_SITE_OTHER): Payer: Self-pay

## 2023-12-06 ENCOUNTER — Ambulatory Visit (INDEPENDENT_AMBULATORY_CARE_PROVIDER_SITE_OTHER): Payer: Medicare Other | Admitting: Otolaryngology

## 2023-12-06 VITALS — BP 157/91 | HR 81 | Ht 70.5 in | Wt 230.0 lb

## 2023-12-06 DIAGNOSIS — R0981 Nasal congestion: Secondary | ICD-10-CM

## 2023-12-06 DIAGNOSIS — H6982 Other specified disorders of Eustachian tube, left ear: Secondary | ICD-10-CM

## 2023-12-06 DIAGNOSIS — J310 Chronic rhinitis: Secondary | ICD-10-CM | POA: Diagnosis not present

## 2023-12-06 DIAGNOSIS — J343 Hypertrophy of nasal turbinates: Secondary | ICD-10-CM

## 2023-12-06 DIAGNOSIS — J342 Deviated nasal septum: Secondary | ICD-10-CM | POA: Diagnosis not present

## 2023-12-07 DIAGNOSIS — J342 Deviated nasal septum: Secondary | ICD-10-CM | POA: Insufficient documentation

## 2023-12-07 DIAGNOSIS — H6982 Other specified disorders of Eustachian tube, left ear: Secondary | ICD-10-CM | POA: Insufficient documentation

## 2023-12-07 DIAGNOSIS — H1031 Unspecified acute conjunctivitis, right eye: Secondary | ICD-10-CM | POA: Diagnosis not present

## 2023-12-07 DIAGNOSIS — H01009 Unspecified blepharitis unspecified eye, unspecified eyelid: Secondary | ICD-10-CM | POA: Diagnosis not present

## 2023-12-07 DIAGNOSIS — J310 Chronic rhinitis: Secondary | ICD-10-CM | POA: Insufficient documentation

## 2023-12-07 DIAGNOSIS — J343 Hypertrophy of nasal turbinates: Secondary | ICD-10-CM | POA: Insufficient documentation

## 2023-12-07 NOTE — Progress Notes (Signed)
 Patient ID: Jim Graham, male   DOB: [birth year], 68 y.o.   MRN: [id number]  CC: Chronic nasal obstruction, clogging sensation in the left ear  HPI:  [name] is a 68 y.o. male who presents today complaining of chronic nasal obstruction and clogging sensation in his left ears.  According to the patient, he has been symptomatic for several decades.  He is a habitual mouth breather.  He has a history of environmental allergies.  He uses over-the-counter allergy medications including steroid nasal sprays.  He underwent bilateral sinus surgery in 2008 by Dr. Christia Reading.  Unfortunately, he continues to be symptomatic after the surgery.  He has no other ENT surgery.  He denies any history of sleep apnea.  Past Medical History:  Diagnosis Date   GERD (gastroesophageal reflux disease)    Hyperlipidemia    Palpitations     Past Surgical History:  Procedure Laterality Date   bicep re-attachment     NASAL SINUS SURGERY     shoulder arthroscopy Left 2018   SHOULDER ARTHROSCOPY Right 08/26/2020   SHOULDER ARTHROSCOPY WITH DEBRIDEMENT AND BICEP TENDON REPAIR Right 10/18/2021   Procedure: RIGHT SHOULDER ARTHROSCOPY, DEBRIDEMENT, BICEPS TENODESIS;  Surgeon: Cammy Copa, MD;  Location: [facility] OR;  Service: Orthopedics;  Laterality: Right;    Family History  Adopted: Yes  Problem Relation Age of Onset   Heart attack Paternal Grandfather     Social History:  reports that he has never smoked. He has never used smokeless tobacco. He reports that he does not drink alcohol and does not use drugs.  Allergies:  Allergies  Allergen Reactions   Pravastatin     Other Reaction(s): leg pain   Simvastatin     Other Reaction(s): chest pain   Pitavastatin Palpitations    Prior to Admission medications   Medication Sig Start Date End Date Taking? Authorizing Provider  aspirin 81 MG tablet Take 81 mg by mouth daily.     Yes [provider]  celecoxib (CELEBREX) 200 MG capsule Take 1  capsule (200 mg total) by mouth 2 (two) times daily as needed. 02/02/22  Yes Ellin Goodie E, NP  rosuvastatin (CRESTOR) 10 MG tablet TAKE 1 TABLET BY MOUTH AT 1PM AND 1 TAB AT DINNER. 12/26/19  Yes Hilty, Lisette Abu, MD  testosterone cypionate (DEPOTESTOSTERONE CYPIONATE) 200 MG/ML injection Inject 100 mg into the muscle every 7 (seven) days. 09/06/21  Yes [provider]  buPROPion (WELLBUTRIN XL) 300 MG 24 hr tablet Take 300 mg by mouth as needed. Patient not taking: Reported on 12/06/2023 12/04/22   [provider]  Coenzyme Q10 100 MG capsule Take 100 mg by mouth daily. Patient not taking: Reported on 12/06/2023    [provider]  esomeprazole (NEXIUM) 40 MG capsule Take 40 mg by mouth as needed. Patient not taking: Reported on 12/06/2023 12/06/21   [provider]  methocarbamol (ROBAXIN) 500 MG tablet Take 1 tablet (500 mg total) by mouth every 8 (eight) hours as needed for muscle spasms. Patient not taking: Reported on 12/06/2023 10/18/21   Magnant, Joycie Peek, PA-C  Multiple Vitamin (MULTIVITAMIN PO) Take 1 Package by mouth daily.    [provider]  ondansetron (ZOFRAN) 4 MG tablet Take 1 tablet (4 mg total) by mouth every 8 (eight) hours as needed for nausea or vomiting. Patient not taking: Reported on 12/06/2023 10/19/21   Cammy Copa, MD  oxyCODONE-acetaminophen (PERCOCET/ROXICET) 5-325 MG tablet Take 1 tablet by mouth every 4 (  four) hours as needed for severe pain. Patient not taking: Reported on 12/06/2023 10/18/21   Magnant, Joycie Peek, PA-C  sildenafil (REVATIO) 20 MG tablet Take 20 mg by mouth as needed. Patient not taking: Reported on 12/06/2023 02/08/15   [provider]  SYRINGE-NEEDLE, DISP, 3 ML (B-D 3CC LUER-LOK SYR 23GX1") 23G X 1" 3 ML MISC once a week.    [provider]  tadalafil (CIALIS) 10 MG tablet Take 10 mg by mouth daily as needed.    [provider]  valACYclovir (VALTREX) 500 MG tablet Take 500 mg  by mouth 2 (two) times daily. Patient not taking: Reported on 12/06/2023 12/11/14   [provider]    Blood pressure (!) 157/91, pulse 81, height 5' 10.5" (1.791 m), weight 230 lb (104.3 kg), SpO2 93%. Exam: General: Communicates without difficulty, well nourished, no acute distress. Head: Normocephalic, no evidence injury, no tenderness, facial buttresses intact without stepoff. Face/sinus: No tenderness to palpation and percussion. Facial movement is normal and symmetric. Eyes: PERRL, EOMI. No scleral icterus, conjunctivae clear. Neuro: CN II exam reveals vision grossly intact.  No nystagmus at any point of gaze. Ears: Auricles well formed without lesions.  Ear canals are intact without mass or lesion.  No erythema or edema is appreciated.  The TMs are intact without fluid. Nose: External evaluation reveals normal support and skin without lesions.  Dorsum is intact.  Anterior rhinoscopy reveals congested mucosa over anterior aspect of inferior turbinates and intact septum.  No purulence noted. Oral:  Oral cavity and oropharynx are intact, symmetric, without erythema or edema.  Mucosa is moist without lesions. Neck: Full range of motion without pain.  There is no significant lymphadenopathy.  No masses palpable.  Thyroid bed within normal limits to palpation.  Parotid glands and submandibular glands equal bilaterally without mass.  Trachea is midline. Neuro:  CN 2-12 grossly intact.   Procedure:  Flexible Nasal Endoscopy: Description: Risks, benefits, and alternatives of flexible endoscopy were explained to the patient.  Specific mention was made of the risk of throat numbness with difficulty swallowing, possible bleeding from the nose and mouth, and pain from the procedure.  The patient gave oral consent to proceed.  The flexible scope was inserted into the right nasal cavity.  Endoscopy of the interior nasal cavity, superior, inferior, and middle meatus was performed. The sphenoid-ethmoid recess  was examined. Edematous mucosa was noted.  No polyp, mass, or lesion was appreciated. Nasal septal deviation noted. Olfactory cleft was clear.  Nasopharynx was clear.  Turbinates were hypertrophied but without mass.  The procedure was repeated on the contralateral side with similar findings.  The patient tolerated the procedure well.   Assessment: 1.  Chronic rhinitis with nasal mucosal congestion, nasal septal deviation, and bilateral inferior turbinate hypertrophy.  More than 95% of his nasal passageways are obstructed bilaterally.  He has not responded to treatment with allergy medications for the past 10 years. 2.  Clinical left ear eustachian tube dysfunction.  His ear canals, tympanic membranes, middle ear spaces are normal.  Plan: 1.  The physical exam and nasal endoscopy findings are reviewed with the patient. 2.  Valsalva exercise to treat his eustachian tube dysfunction. 3.  Based on the above findings, the patient will benefit from surgical intervention with septoplasty and bilateral inferior turbinate reduction.  The risk, benefits, and details of the procedures are extensively discussed.  Questions are invited and answered. 4.  The patient would like to proceed with the procedures.  We will schedule the procedures in accordance with the patient's schedule.  Dez Stauffer W Akila Batta 12/07/2023, 3:31 PM

## 2023-12-21 DIAGNOSIS — H1031 Unspecified acute conjunctivitis, right eye: Secondary | ICD-10-CM | POA: Diagnosis not present

## 2023-12-21 DIAGNOSIS — H01009 Unspecified blepharitis unspecified eye, unspecified eyelid: Secondary | ICD-10-CM | POA: Diagnosis not present

## 2023-12-24 ENCOUNTER — Encounter (HOSPITAL_BASED_OUTPATIENT_CLINIC_OR_DEPARTMENT_OTHER): Payer: Self-pay | Admitting: Otolaryngology

## 2023-12-24 DIAGNOSIS — J3089 Other allergic rhinitis: Secondary | ICD-10-CM | POA: Diagnosis not present

## 2023-12-24 DIAGNOSIS — J301 Allergic rhinitis due to pollen: Secondary | ICD-10-CM | POA: Diagnosis not present

## 2023-12-27 DIAGNOSIS — E559 Vitamin D deficiency, unspecified: Secondary | ICD-10-CM | POA: Diagnosis not present

## 2023-12-27 DIAGNOSIS — R635 Abnormal weight gain: Secondary | ICD-10-CM | POA: Diagnosis not present

## 2023-12-27 DIAGNOSIS — R7303 Prediabetes: Secondary | ICD-10-CM | POA: Diagnosis not present

## 2023-12-27 DIAGNOSIS — E785 Hyperlipidemia, unspecified: Secondary | ICD-10-CM | POA: Diagnosis not present

## 2023-12-27 DIAGNOSIS — Z79890 Hormone replacement therapy (postmenopausal): Secondary | ICD-10-CM | POA: Diagnosis not present

## 2023-12-27 DIAGNOSIS — D509 Iron deficiency anemia, unspecified: Secondary | ICD-10-CM | POA: Diagnosis not present

## 2023-12-30 NOTE — Anesthesia Preprocedure Evaluation (Signed)
 Anesthesia Evaluation  Patient identified by MRN, date of birth, ID band Patient awake    Reviewed: Allergy & Precautions, NPO status , Patient's Chart, lab work & pertinent test results  History of Anesthesia Complications Negative for: history of anesthetic complications  Airway Mallampati: II  TM Distance: >3 FB Neck ROM: Full    Dental no notable dental hx.    Pulmonary neg pulmonary ROS   Pulmonary exam normal        Cardiovascular negative cardio ROS Normal cardiovascular exam     Neuro/Psych negative neurological ROS  negative psych ROS   GI/Hepatic Neg liver ROS,GERD  Controlled,,  Endo/Other  negative endocrine ROS    Renal/GU negative Renal ROS  negative genitourinary   Musculoskeletal  (+) Arthritis ,    Abdominal   Peds  Hematology negative hematology ROS (+)   Anesthesia Other Findings Deviated septum/turbinate hypertrophy  Reproductive/Obstetrics negative OB ROS                              Anesthesia Physical Anesthesia Plan  ASA: 2  Anesthesia Plan: General   Post-op Pain Management: Tylenol PO (pre-op)*   Induction: Intravenous  PONV Risk Score and Plan: 2 and Ondansetron, Dexamethasone, Treatment may vary due to age or medical condition and Midazolam  Airway Management Planned: Oral ETT  Additional Equipment: None  Intra-op Plan:   Post-operative Plan: Extubation in OR  Informed Consent: I have reviewed the patients History and Physical, chart, labs and discussed the procedure including the risks, benefits and alternatives for the proposed anesthesia with the patient or authorized representative who has indicated his/her understanding and acceptance.     Dental advisory given  Plan Discussed with: CRNA  Anesthesia Plan Comments:         Anesthesia Quick Evaluation

## 2023-12-31 ENCOUNTER — Encounter (HOSPITAL_BASED_OUTPATIENT_CLINIC_OR_DEPARTMENT_OTHER)
Admission: RE | Disposition: A | Discharge status: Home / Self Care | Payer: Self-pay | Source: Ambulatory Visit | Attending: Otolaryngology

## 2023-12-31 ENCOUNTER — Other Ambulatory Visit: Payer: Self-pay

## 2023-12-31 ENCOUNTER — Ambulatory Visit (HOSPITAL_BASED_OUTPATIENT_CLINIC_OR_DEPARTMENT_OTHER): Payer: Self-pay | Admitting: Anesthesiology

## 2023-12-31 ENCOUNTER — Ambulatory Visit (HOSPITAL_BASED_OUTPATIENT_CLINIC_OR_DEPARTMENT_OTHER)
Admission: RE | Admit: 2023-12-31 | Discharge: 2023-12-31 | Disposition: A | Discharge status: Home / Self Care | Payer: Medicare Other | Source: Ambulatory Visit | Attending: Otolaryngology | Admitting: Otolaryngology

## 2023-12-31 ENCOUNTER — Encounter (HOSPITAL_BASED_OUTPATIENT_CLINIC_OR_DEPARTMENT_OTHER): Payer: Self-pay | Admitting: Otolaryngology

## 2023-12-31 DIAGNOSIS — H6992 Unspecified Eustachian tube disorder, left ear: Secondary | ICD-10-CM | POA: Insufficient documentation

## 2023-12-31 DIAGNOSIS — J310 Chronic rhinitis: Secondary | ICD-10-CM | POA: Insufficient documentation

## 2023-12-31 DIAGNOSIS — M1990 Unspecified osteoarthritis, unspecified site: Secondary | ICD-10-CM | POA: Diagnosis not present

## 2023-12-31 DIAGNOSIS — Z79899 Other long term (current) drug therapy: Secondary | ICD-10-CM | POA: Diagnosis not present

## 2023-12-31 DIAGNOSIS — Z7982 Long term (current) use of aspirin: Secondary | ICD-10-CM | POA: Insufficient documentation

## 2023-12-31 DIAGNOSIS — Z79624 Long term (current) use of inhibitors of nucleotide synthesis: Secondary | ICD-10-CM | POA: Insufficient documentation

## 2023-12-31 DIAGNOSIS — J342 Deviated nasal septum: Secondary | ICD-10-CM

## 2023-12-31 DIAGNOSIS — K219 Gastro-esophageal reflux disease without esophagitis: Secondary | ICD-10-CM | POA: Diagnosis not present

## 2023-12-31 DIAGNOSIS — J343 Hypertrophy of nasal turbinates: Secondary | ICD-10-CM | POA: Diagnosis not present

## 2023-12-31 DIAGNOSIS — Z01818 Encounter for other preprocedural examination: Secondary | ICD-10-CM

## 2023-12-31 DIAGNOSIS — J3489 Other specified disorders of nose and nasal sinuses: Secondary | ICD-10-CM | POA: Diagnosis not present

## 2023-12-31 HISTORY — PX: NASAL SEPTOPLASTY W/ TURBINOPLASTY: SHX2070

## 2023-12-31 SURGERY — SEPTOPLASTY, NOSE, WITH NASAL TURBINATE REDUCTION
Anesthesia: General | Site: Nose | Laterality: Bilateral

## 2023-12-31 MED ORDER — LACTATED RINGERS IV SOLN: INTRAVENOUS | Status: DC

## 2023-12-31 MED ORDER — CEFAZOLIN SODIUM-DEXTROSE 2-3 GM-%(50ML) IV SOLR
INTRAVENOUS | Status: DC | PRN
Start: 2023-12-31 — End: 2023-12-31
  Administered 2023-12-31: 2 g via INTRAVENOUS

## 2023-12-31 MED ORDER — DEXMEDETOMIDINE HCL IN NACL 80 MCG/20ML IV SOLN
INTRAVENOUS | Status: DC | PRN
  Administered 2023-12-31: 12 ug via INTRAVENOUS

## 2023-12-31 MED ORDER — PROPOFOL 10 MG/ML IV BOLUS
INTRAVENOUS | Status: DC | PRN
  Administered 2023-12-31: 170 mg via INTRAVENOUS

## 2023-12-31 MED ORDER — MIDAZOLAM HCL 2 MG/2ML IJ SOLN
INTRAMUSCULAR | Status: AC
  Filled 2023-12-31: qty 2

## 2023-12-31 MED ORDER — OXYMETAZOLINE HCL 0.05 % NA SOLN
NASAL | Status: DC | PRN
  Administered 2023-12-31: 1 via TOPICAL

## 2023-12-31 MED ORDER — DROPERIDOL 2.5 MG/ML IJ SOLN: 0.6250 mg | Freq: Once | INTRAMUSCULAR | Status: DC | PRN

## 2023-12-31 MED ORDER — 0.9 % SODIUM CHLORIDE (POUR BTL) OPTIME
TOPICAL | Status: DC | PRN
  Administered 2023-12-31: 200 mL

## 2023-12-31 MED ORDER — ROCURONIUM BROMIDE 100 MG/10ML IV SOLN
INTRAVENOUS | Status: DC | PRN
  Administered 2023-12-31: 60 mg via INTRAVENOUS

## 2023-12-31 MED ORDER — MUPIROCIN 2 % EX OINT
TOPICAL_OINTMENT | CUTANEOUS | Status: DC | PRN
  Administered 2023-12-31: 1 via NASAL

## 2023-12-31 MED ORDER — DEXMEDETOMIDINE HCL IN NACL 80 MCG/20ML IV SOLN
INTRAVENOUS | Status: AC
  Filled 2023-12-31: qty 20

## 2023-12-31 MED ORDER — FENTANYL CITRATE (PF) 100 MCG/2ML IJ SOLN
INTRAMUSCULAR | Status: AC
Start: 2023-12-31 — End: ?
  Filled 2023-12-31: qty 2

## 2023-12-31 MED ORDER — ONDANSETRON HCL 4 MG/2ML IJ SOLN
INTRAMUSCULAR | Status: DC | PRN
  Administered 2023-12-31: 4 mg via INTRAVENOUS

## 2023-12-31 MED ORDER — MUPIROCIN 2 % EX OINT
TOPICAL_OINTMENT | CUTANEOUS | Status: AC
  Filled 2023-12-31: qty 22

## 2023-12-31 MED ORDER — FENTANYL CITRATE (PF) 100 MCG/2ML IJ SOLN
INTRAMUSCULAR | Status: AC
  Filled 2023-12-31: qty 2

## 2023-12-31 MED ORDER — LIDOCAINE 2% (20 MG/ML) 5 ML SYRINGE
INTRAMUSCULAR | Status: AC
  Filled 2023-12-31: qty 5

## 2023-12-31 MED ORDER — CEFAZOLIN SODIUM 1 G IJ SOLR
INTRAMUSCULAR | Status: AC
  Filled 2023-12-31: qty 20

## 2023-12-31 MED ORDER — DEXAMETHASONE SODIUM PHOSPHATE 10 MG/ML IJ SOLN
INTRAMUSCULAR | Status: DC | PRN
  Administered 2023-12-31: 10 mg via INTRAVENOUS

## 2023-12-31 MED ORDER — LIDOCAINE 2% (20 MG/ML) 5 ML SYRINGE
INTRAMUSCULAR | Status: DC | PRN
  Administered 2023-12-31: 100 mg via INTRAVENOUS

## 2023-12-31 MED ORDER — ONDANSETRON HCL 4 MG/2ML IJ SOLN
INTRAMUSCULAR | Status: AC
  Filled 2023-12-31: qty 2

## 2023-12-31 MED ORDER — ROCURONIUM BROMIDE 10 MG/ML (PF) SYRINGE
PREFILLED_SYRINGE | INTRAVENOUS | Status: AC
  Filled 2023-12-31: qty 10

## 2023-12-31 MED ORDER — ACETAMINOPHEN 500 MG PO TABS
1000.0000 mg | ORAL_TABLET | Freq: Once | ORAL | Status: AC
  Administered 2023-12-31: 1000 mg via ORAL

## 2023-12-31 MED ORDER — LIDOCAINE-EPINEPHRINE 1 %-1:100000 IJ SOLN
INTRAMUSCULAR | Status: DC | PRN
  Administered 2023-12-31: 3.5 mL

## 2023-12-31 MED ORDER — DEXAMETHASONE SODIUM PHOSPHATE 10 MG/ML IJ SOLN
INTRAMUSCULAR | Status: AC
  Filled 2023-12-31: qty 1

## 2023-12-31 MED ORDER — OXYMETAZOLINE HCL 0.05 % NA SOLN
NASAL | Status: AC
  Filled 2023-12-31: qty 30

## 2023-12-31 MED ORDER — OXYCODONE HCL 5 MG/5ML PO SOLN: 5.0000 mg | Freq: Once | ORAL | Status: DC | PRN

## 2023-12-31 MED ORDER — MIDAZOLAM HCL 5 MG/5ML IJ SOLN
INTRAMUSCULAR | Status: DC | PRN
  Administered 2023-12-31: 2 mg via INTRAVENOUS

## 2023-12-31 MED ORDER — FENTANYL CITRATE (PF) 100 MCG/2ML IJ SOLN
25.0000 ug | INTRAMUSCULAR | Status: DC | PRN
  Administered 2023-12-31: 50 ug via INTRAVENOUS

## 2023-12-31 MED ORDER — PHENYLEPHRINE HCL (PRESSORS) 10 MG/ML IV SOLN
INTRAVENOUS | Status: DC | PRN
  Administered 2023-12-31: 80 ug via INTRAVENOUS

## 2023-12-31 MED ORDER — PROPOFOL 10 MG/ML IV BOLUS
INTRAVENOUS | Status: AC
  Filled 2023-12-31: qty 20

## 2023-12-31 MED ORDER — FENTANYL CITRATE (PF) 100 MCG/2ML IJ SOLN
INTRAMUSCULAR | Status: DC | PRN
Start: 2023-12-31 — End: 2023-12-31
  Administered 2023-12-31: 100 ug via INTRAVENOUS
  Administered 2023-12-31: 50 ug via INTRAVENOUS
  Administered 2023-12-31: 100 ug via INTRAVENOUS
  Administered 2023-12-31: 50 ug via INTRAVENOUS

## 2023-12-31 MED ORDER — OXYCODONE HCL 5 MG PO TABS: 5.0000 mg | ORAL_TABLET | Freq: Once | ORAL | Status: DC | PRN

## 2023-12-31 MED ORDER — SODIUM CHLORIDE (PF) 0.9 % IJ SOLN
INTRAMUSCULAR | Status: AC
  Filled 2023-12-31: qty 20

## 2023-12-31 MED ORDER — SUGAMMADEX SODIUM 200 MG/2ML IV SOLN
INTRAVENOUS | Status: DC | PRN
  Administered 2023-12-31: 200 mg via INTRAVENOUS

## 2023-12-31 MED ORDER — LIDOCAINE-EPINEPHRINE 1 %-1:100000 IJ SOLN
INTRAMUSCULAR | Status: AC
  Filled 2023-12-31: qty 1

## 2023-12-31 MED ORDER — ACETAMINOPHEN 500 MG PO TABS
ORAL_TABLET | ORAL | Status: AC
  Filled 2023-12-31: qty 2

## 2023-12-31 MED ORDER — PHENYLEPHRINE 80 MCG/ML (10ML) SYRINGE FOR IV PUSH (FOR BLOOD PRESSURE SUPPORT)
PREFILLED_SYRINGE | INTRAVENOUS | Status: AC
  Filled 2023-12-31: qty 10

## 2023-12-31 SURGICAL SUPPLY — 29 items
CANISTER SUCT 1200ML W/VALVE (MISCELLANEOUS) ×1 IMPLANT
COAGULATOR SUCT 8FR VV (MISCELLANEOUS) ×1 IMPLANT
DEFOGGER MIRROR 1QT (MISCELLANEOUS) ×1 IMPLANT
DRSG NASOPORE 8CM (GAUZE/BANDAGES/DRESSINGS) IMPLANT
DRSG TELFA 3X8 NADH STRL (GAUZE/BANDAGES/DRESSINGS) IMPLANT
ELECT REM PT RETURN 9FT ADLT (ELECTROSURGICAL) ×1 IMPLANT
ELECTRODE REM PT RTRN 9FT ADLT (ELECTROSURGICAL) ×1 IMPLANT
GAUZE SPONGE 2X2 STRL 8-PLY (GAUZE/BANDAGES/DRESSINGS) ×1 IMPLANT
GLOVE BIO SURGEON STRL SZ7.5 (GLOVE) ×1 IMPLANT
GOWN STRL REUS W/ TWL LRG LVL3 (GOWN DISPOSABLE) ×2 IMPLANT
NDL HYPO 25X1 1.5 SAFETY (NEEDLE) ×1 IMPLANT
NEEDLE HYPO 25X1 1.5 SAFETY (NEEDLE) ×1 IMPLANT
NS IRRIG 1000ML POUR BTL (IV SOLUTION) ×1 IMPLANT
PACK BASIN DAY SURGERY FS (CUSTOM PROCEDURE TRAY) ×1 IMPLANT
PACK ENT DAY SURGERY (CUSTOM PROCEDURE TRAY) ×1 IMPLANT
PAD MAGNETIC INSTR ST 16X20 (MISCELLANEOUS) IMPLANT
SLEEVE SCD COMPRESS KNEE MED (STOCKING) IMPLANT
SPIKE FLUID TRANSFER (MISCELLANEOUS) IMPLANT
SPLINT NASAL AIRWAY SILICONE (MISCELLANEOUS) ×1 IMPLANT
SPONGE NEURO XRAY DETECT 1X3 (DISPOSABLE) ×1 IMPLANT
SUT CHROMIC 4 0 P 3 18 (SUTURE) ×1 IMPLANT
SUT CHROMIC 4 0 RB 1X27 (SUTURE) IMPLANT
SUT PLAIN 4 0 ~~LOC~~ 1 (SUTURE) ×1 IMPLANT
SUT PROLENE 3 0 PS 2 (SUTURE) ×1 IMPLANT
SUT VIC AB 4-0 P-3 18XBRD (SUTURE) IMPLANT
TOWEL GREEN STERILE FF (TOWEL DISPOSABLE) ×1 IMPLANT
TUBE SALEM SUMP 12FR 48 (TUBING) IMPLANT
TUBE SALEM SUMP 16F (TUBING) ×1 IMPLANT
YANKAUER SUCT BULB TIP NO VENT (SUCTIONS) ×1 IMPLANT

## 2023-12-31 NOTE — Discharge Instructions (Addendum)
 Next dose of Tylenol due anytime after 12:30 today if needed  POSTOPERATIVE INSTRUCTIONS FOR PATIENTS HAVING NASAL OR SINUS OPERATIONS ACTIVITY: Restrict activity at home for the first two days, resting as much as possible. Light activity is best. You may usually return to work within a week. You should refrain from nose blowing, strenuous activity, or heavy lifting greater than 20lbs for a total of one week after your operation.  If sneezing cannot be avoided, sneeze with your mouth open. DISCOMFORT: You may experience a dull headache and pressure along with nasal congestion and discharge. These symptoms may be worse during the first week after the operation but may last as long as two to four weeks.  Please take Tylenol or the pain medication that has been prescribed for you. Do not take aspirin or aspirin containing medications since they may cause bleeding.  You may experience symptoms of post nasal drainage, nasal congestion, headaches and fatigue for two or three months after your operation.  BLEEDING: You may have some blood tinged nasal drainage for approximately two weeks after the operation.  The discharge will be worse for the first week.  Please call our office at [phone] or go to the nearest hospital emergency room if you experience any of the following: heavy, bright red blood from your nose or mouth that lasts longer than 15 minutes or coughing up or vomiting bright red blood or blood clots. GENERAL CONSIDERATIONS: A gauze dressing will be placed on your upper lip to absorb any drainage after the operation. You may need to change this several times a day.  If you do not have very much drainage, you may remove the dressing.  Remember that you may gently wipe your nose with a tissue and sniff in, but DO NOT blow your nose. Please keep all of your postoperative appointments.  Your final results after the operation will depend on proper follow-up.  The initial visit is usually 2 to 5 days  after the operation.  During this visit, the remaining nasal packing and internal septal splints will be removed.  Your nasal and sinus cavities will be cleaned.  During the second visit, your nasal and sinus cavities will be cleaned again. Have someone drive you to your first two postoperative appointments.  How you care for your nose after the operation will influence the results that you obtain.  You should follow all directions, take your medication as prescribed, and call our office [phone] with any problems or questions. You may be more comfortable sleeping with your head elevated on two pillows. Do not take any medications that we have not prescribed or recommended. WARNING SIGNS: if any of the following should occur, please call our office: Persistent fever greater than 102F. Persistent vomiting. Severe and constant pain that is not relieved by prescribed pain medication. Trauma to the nose. Rash or unusual side effects from any medicines.      Post Anesthesia Home Care Instructions  Activity: Get plenty of rest for the remainder of the day. A responsible individual must stay with you for 24 hours following the procedure.  For the next 24 hours, DO NOT: -Drive a car -Advertising copywriter -Drink alcoholic beverages -Take any medication unless instructed by your physician -Make any legal decisions or sign important papers.  Meals: Start with liquid foods such as gelatin or soup. Progress to regular foods as tolerated. Avoid greasy, spicy, heavy foods. If nausea and/or vomiting occur, drink only clear liquids until the nausea and/or vomiting subsides.  Call your physician if vomiting continues.  Special Instructions/Symptoms: Your throat may feel dry or sore from the anesthesia or the breathing tube placed in your throat during surgery. If this causes discomfort, gargle with warm salt water. The discomfort should disappear within 24 hours.  If you had a scopolamine patch placed  behind your ear for the management of post- operative nausea and/or vomiting:  1. The medication in the patch is effective for 72 hours, after which it should be removed.  Wrap patch in a tissue and discard in the trash. Wash hands thoroughly with soap and water. 2. You may remove the patch earlier than 72 hours if you experience unpleasant side effects which may include dry mouth, dizziness or visual disturbances. 3. Avoid touching the patch. Wash your hands with soap and water after contact with the patch.

## 2023-12-31 NOTE — Anesthesia Procedure Notes (Signed)
 Procedure Name: Intubation Date/Time: 12/31/2023 7:29 AM  Performed by: Lauralyn Primes, CRNAPre-anesthesia Checklist: Patient identified, Emergency Drugs available, Suction available and Patient being monitored Patient Re-evaluated:Patient Re-evaluated prior to induction Oxygen Delivery Method: Circle system utilized Preoxygenation: Pre-oxygenation with 100% oxygen Induction Type: IV induction Ventilation: Mask ventilation without difficulty and Oral airway inserted - appropriate to patient size Laryngoscope Size: Mac and 4 Grade View: Grade I Tube type: Oral Tube size: 7.5 mm Number of attempts: 1 Airway Equipment and Method: Stylet, Oral airway and Bite block Placement Confirmation: ETT inserted through vocal cords under direct vision, positive ETCO2 and breath sounds checked- equal and bilateral Secured at: 23 cm Tube secured with: Tape Dental Injury: Teeth and Oropharynx as per pre-operative assessment

## 2023-12-31 NOTE — Transfer of Care (Signed)
 Immediate Anesthesia Transfer of Care Note  Patient: Jim Graham  Procedure(s) Performed: SEPTOPLASTY, NOSE, WITH NASAL TURBINATE REDUCTION (Bilateral: Nose)  Patient Location: PACU  Anesthesia Type:General  Level of Consciousness: awake, alert , and oriented  Airway & Oxygen Therapy: Patient Spontanous Breathing and Patient connected to face mask oxygen  Post-op Assessment: Report given to RN and Post -op Vital signs reviewed and stable  Post vital signs: Reviewed and stable  Last Vitals:  Vitals Value Taken Time  BP 182/112 12/31/23 0850  Temp    Pulse 68 12/31/23 0853  Resp 11 12/31/23 0853  SpO2 92 % 12/31/23 0853  Vitals shown include unfiled device data.  Last Pain:  Vitals:   12/31/23 0620  TempSrc: Oral  PainSc: 0-No pain      Patients Stated Pain Goal: 3 (12/31/23 0981)  Complications: No notable events documented.

## 2023-12-31 NOTE — Op Note (Signed)
 DATE OF PROCEDURE: 12/31/2023  OPERATIVE REPORT   SURGEON: Newman Pies, MD   PREOPERATIVE DIAGNOSES:  1. Severe nasal septal deviation.  2. Bilateral inferior turbinate hypertrophy.  3. Chronic nasal obstruction.  POSTOPERATIVE DIAGNOSES:  1. Severe nasal septal deviation.  2. Bilateral inferior turbinate hypertrophy.  3. Chronic nasal obstruction.  PROCEDURE PERFORMED:  1. Septoplasty.  2. Bilateral partial inferior turbinate resection.   ANESTHESIA: General endotracheal tube anesthesia.   COMPLICATIONS: None.   ESTIMATED BLOOD LOSS: 100 mL.   INDICATION FOR PROCEDURE: Jim Graham is a 68 y.o. male with a history of chronic nasal obstruction. The patient was treated with antihistamine, decongestant, and steroid nasal sprays. However, the patient continued to be symptomatic. On examination, the patient was noted to have bilateral severe inferior turbinate hypertrophy and significant nasal septal deviation, causing significant nasal obstruction. Based on the above findings, the decision was made for the patient to undergo the above-stated procedures. The risks, benefits, alternatives, and details of the procedures were discussed with the patient. Questions were invited and answered. Informed consent was obtained.   DESCRIPTION OF PROCEDURE: The patient was taken to the operating room and placed supine on the operating table. General endotracheal tube anesthesia was administered by the anesthesiologist. The patient was positioned, and prepped and draped in the standard fashion for nasal surgery. Pledgets soaked with Afrin were placed in both nasal cavities for decongestion. The pledgets were subsequently removed.   Examination of the nasal cavity revealed a severe nasal septal deviation. 1% lidocaine with 1:100,000 epinephrine was injected onto the nasal septum bilaterally. A hemitransfixion incision was made on the left side. The mucosal flap was carefully elevated on the left side. A  cartilaginous incision was made 1 cm superior to the caudal margin of the nasal septum. Mucosal flap was also elevated on the right side in the similar fashion. It should be noted that due to the severe septal deviation, the deviated portion of the cartilaginous and bony septum had to be removed in piecemeal fashion. Once the deviated portions were removed, a straight midline septum was achieved. The septum was then quilted with 4-0 plain gut sutures. The hemitransfixion incision was closed with interrupted 4-0 chromic sutures.   The inferior one half of both hypertrophied inferior turbinate was crossclamped with a Kelly clamp. The inferior one half of each inferior turbinate was then resected with a pair of cross cutting scissors. Hemostasis was achieved with a suction cautery device. Doyle splints were applied to the nasal septum.  The care of the patient was turned over to the anesthesiologist. The patient was awakened from anesthesia without difficulty. The patient was extubated and transferred to the recovery room in good condition.   OPERATIVE FINDINGS: Severe nasal septal deviation and bilateral inferior turbinate hypertrophy.   SPECIMEN: None.   FOLLOWUP CARE: The patient be discharged home once he is awake and alert. The patient will follow up in my office in 3 days for splint removal.   Jim Graham Philomena Doheny, MD

## 2023-12-31 NOTE — H&P (Signed)
 CC: Chronic nasal obstruction   HPI:  Jim Graham is a 68 y.o. male who presents today complaining of chronic nasal obstruction and clogging sensation in his left ears.  According to the patient, he has been symptomatic for several decades.  He is a habitual mouth breather.  He has a history of environmental allergies.  He uses over-the-counter allergy medications including steroid nasal sprays.  He underwent bilateral sinus surgery in 2008 by Dr. Christia Reading.  Unfortunately, he continues to be symptomatic after the surgery.  He has no other ENT surgery.  He denies any history of sleep apnea.       Past Medical History:  Diagnosis Date   GERD (gastroesophageal reflux disease)     Hyperlipidemia     Palpitations                 Past Surgical History:  Procedure Laterality Date   bicep re-attachment       NASAL SINUS SURGERY       shoulder arthroscopy Left 2018   SHOULDER ARTHROSCOPY Right 08/26/2020   SHOULDER ARTHROSCOPY WITH DEBRIDEMENT AND BICEP TENDON REPAIR Right 10/18/2021    Procedure: RIGHT SHOULDER ARTHROSCOPY, DEBRIDEMENT, BICEPS TENODESIS;  Surgeon: Cammy Copa, MD;  Location: [facility];  Service: Orthopedics;  Laterality: Right;               Family History  Adopted: Yes  Problem Relation Age of Onset   Heart attack Paternal Grandfather            Social History:  reports that he has never smoked. He has never used smokeless tobacco. He reports that he does not drink alcohol and does not use drugs.   Allergies:  Allergies       Allergies  Allergen Reactions   Pravastatin        Other Reaction(s): leg pain   Simvastatin        Other Reaction(s): chest pain   Pitavastatin Palpitations               Prior to Admission medications   Medication Sig Start Date End Date Taking? Authorizing Provider  aspirin 81 MG tablet Take 81 mg by mouth daily.       Yes [provider]  celecoxib (CELEBREX) 200 MG capsule Take 1 capsule (200 mg total) by  mouth 2 (two) times daily as needed. 02/02/22   Yes Ellin Goodie E, NP  rosuvastatin (CRESTOR) 10 MG tablet TAKE 1 TABLET BY MOUTH AT 1PM AND 1 TAB AT DINNER. 12/26/19   Yes Hilty, Lisette Abu, MD  testosterone cypionate (DEPOTESTOSTERONE CYPIONATE) 200 MG/ML injection Inject 100 mg into the muscle every 7 (seven) days. 09/06/21   Yes [provider]  buPROPion (WELLBUTRIN XL) 300 MG 24 hr tablet Take 300 mg by mouth as needed. Patient not taking: Reported on 12/06/2023 12/04/22     [provider]  Coenzyme Q10 100 MG capsule Take 100 mg by mouth daily. Patient not taking: Reported on 12/06/2023       [provider]  esomeprazole (NEXIUM) 40 MG capsule Take 40 mg by mouth as needed. Patient not taking: Reported on 12/06/2023 12/06/21     [provider]  methocarbamol (ROBAXIN) 500 MG tablet Take 1 tablet (500 mg total) by mouth every 8 (eight) hours as needed for muscle spasms. Patient not taking: Reported on 12/06/2023 10/18/21     Magnant, Joycie Peek, PA-C  Multiple Vitamin (MULTIVITAMIN PO) Take 1 Package  by mouth daily.       [provider]  ondansetron (ZOFRAN) 4 MG tablet Take 1 tablet (4 mg total) by mouth every 8 (eight) hours as needed for nausea or vomiting. Patient not taking: Reported on 12/06/2023 10/19/21     Cammy Copa, MD  oxyCODONE-acetaminophen (PERCOCET/ROXICET) 5-325 MG tablet Take 1 tablet by mouth every 4 (four) hours as needed for severe pain. Patient not taking: Reported on 12/06/2023 10/18/21     Magnant, Joycie Peek, PA-C  sildenafil (REVATIO) 20 MG tablet Take 20 mg by mouth as needed. Patient not taking: Reported on 12/06/2023 02/08/15     [provider]  SYRINGE-NEEDLE, DISP, 3 ML (B-D 3CC LUER-LOK SYR 23GX1") 23G X 1" 3 ML MISC once a week.       [provider]  tadalafil (CIALIS) 10 MG tablet Take 10 mg by mouth daily as needed.       [provider]  valACYclovir (VALTREX) 500 MG tablet Take 500 mg  by mouth 2 (two) times daily. Patient not taking: Reported on 12/06/2023 12/11/14     [provider]      Blood pressure (!) 157/91, pulse 81, height 5' 10.5" (1.791 m), weight 230 lb (104.3 kg), SpO2 93%. Exam: General: Communicates without difficulty, well nourished, no acute distress. Head: Normocephalic, no evidence injury, no tenderness, facial buttresses intact without stepoff. Face/sinus: No tenderness to palpation and percussion. Facial movement is normal and symmetric. Eyes: PERRL, EOMI. No scleral icterus, conjunctivae clear. Neuro: CN II exam reveals vision grossly intact.  No nystagmus at any point of gaze. Ears: Auricles well formed without lesions.  Ear canals are intact without mass or lesion.  No erythema or edema is appreciated.  The TMs are intact without fluid. Nose: External evaluation reveals normal support and skin without lesions.  Dorsum is intact.  Anterior rhinoscopy reveals congested mucosa over anterior aspect of inferior turbinates and intact septum.  No purulence noted. Oral:  Oral cavity and oropharynx are intact, symmetric, without erythema or edema.  Mucosa is moist without lesions. Neck: Full range of motion without pain.  There is no significant lymphadenopathy.  No masses palpable.  Thyroid bed within normal limits to palpation.  Parotid glands and submandibular glands equal bilaterally without mass.  Trachea is midline. Neuro:  CN 2-12 grossly intact.    Procedure:  Flexible Nasal Endoscopy: Description: Risks, benefits, and alternatives of flexible endoscopy were explained to the patient.  Specific mention was made of the risk of throat numbness with difficulty swallowing, possible bleeding from the nose and mouth, and pain from the procedure.  The patient gave oral consent to proceed.  The flexible scope was inserted into the right nasal cavity.  Endoscopy of the interior nasal cavity, superior, inferior, and middle meatus was performed. The sphenoid-ethmoid  recess was examined. Edematous mucosa was noted.  No polyp, mass, or lesion was appreciated. Nasal septal deviation noted. Olfactory cleft was clear.  Nasopharynx was clear.  Turbinates were hypertrophied but without mass.  The procedure was repeated on the contralateral side with similar findings.  The patient tolerated the procedure well.    Assessment: 1.  Chronic rhinitis with nasal mucosal congestion, nasal septal deviation, and bilateral inferior turbinate hypertrophy.  More than 95% of his nasal passageways are obstructed bilaterally.  He has not responded to treatment with allergy medications for the past 10 years. 2.  Clinical left ear eustachian tube dysfunction.  His ear canals, tympanic membranes, middle ear  spaces are normal.   Plan: 1.  The physical exam and nasal endoscopy findings are reviewed with the patient. 2.  Valsalva exercise to treat his eustachian tube dysfunction. 3.  Based on the above findings, the patient will benefit from surgical intervention with septoplasty and bilateral inferior turbinate reduction.  The risk, benefits, and details of the procedures are extensively discussed.  Questions are invited and answered. 4.  The patient would like to proceed with the procedures.  We will schedule the procedures in accordance with the patient's schedule.

## 2023-12-31 NOTE — Anesthesia Postprocedure Evaluation (Signed)
 Anesthesia Post Note  Patient: Jim Graham  Procedure(s) Performed: SEPTOPLASTY, NOSE, WITH NASAL TURBINATE REDUCTION (Bilateral: Nose)     Patient location during evaluation: PACU Anesthesia Type: General Level of consciousness: awake and alert Pain management: pain level controlled Vital Signs Assessment: post-procedure vital signs reviewed and stable Respiratory status: spontaneous breathing, nonlabored ventilation and respiratory function stable Cardiovascular status: blood pressure returned to baseline Postop Assessment: no apparent nausea or vomiting Anesthetic complications: no   No notable events documented.  Last Vitals:  Vitals:   12/31/23 0924 12/31/23 0930  BP:  (!) 144/95  Pulse:  71  Resp: 17 16  Temp:    SpO2: 97% 98%    Last Pain:  Vitals:   12/31/23 0921  TempSrc:   PainSc: 0-No pain                 Shanda Howells

## 2024-01-01 ENCOUNTER — Encounter (HOSPITAL_BASED_OUTPATIENT_CLINIC_OR_DEPARTMENT_OTHER): Payer: Self-pay | Admitting: Otolaryngology

## 2024-01-02 ENCOUNTER — Encounter (INDEPENDENT_AMBULATORY_CARE_PROVIDER_SITE_OTHER): Payer: Self-pay

## 2024-01-02 ENCOUNTER — Telehealth (INDEPENDENT_AMBULATORY_CARE_PROVIDER_SITE_OTHER): Payer: Self-pay | Admitting: Otolaryngology

## 2024-01-02 NOTE — Telephone Encounter (Signed)
 Reminder Call:  Date: 01/03/2024 Status: Sch  Time: 1:10 PM Left voicemail and/or MyChart message with time and location-[address] [place], [place] [address]

## 2024-01-03 ENCOUNTER — Encounter (INDEPENDENT_AMBULATORY_CARE_PROVIDER_SITE_OTHER): Payer: Self-pay

## 2024-01-03 ENCOUNTER — Ambulatory Visit (INDEPENDENT_AMBULATORY_CARE_PROVIDER_SITE_OTHER): Admitting: Otolaryngology

## 2024-01-03 VITALS — BP 166/88 | HR 89 | Ht 70.0 in | Wt 225.0 lb

## 2024-01-03 DIAGNOSIS — J3089 Other allergic rhinitis: Secondary | ICD-10-CM | POA: Diagnosis not present

## 2024-01-03 DIAGNOSIS — J301 Allergic rhinitis due to pollen: Secondary | ICD-10-CM | POA: Diagnosis not present

## 2024-01-03 DIAGNOSIS — J310 Chronic rhinitis: Secondary | ICD-10-CM

## 2024-01-03 NOTE — Progress Notes (Signed)
 Patient ID: Jim Graham, male   DOB: November 23, 1955, 68 y.o.   MRN: [id number]  Ralph Leyden splints removed. Septum and turbinates are healing well.   Both [place] debrided.  Nasal saline irrigation.  Recheck in 3 weeks.

## 2024-01-04 ENCOUNTER — Ambulatory Visit: Payer: Self-pay | Admitting: Internal Medicine

## 2024-01-07 DIAGNOSIS — M2550 Pain in unspecified joint: Secondary | ICD-10-CM | POA: Diagnosis not present

## 2024-01-07 DIAGNOSIS — Z6834 Body mass index (BMI) 34.0-34.9, adult: Secondary | ICD-10-CM | POA: Diagnosis not present

## 2024-01-07 DIAGNOSIS — E291 Testicular hypofunction: Secondary | ICD-10-CM | POA: Diagnosis not present

## 2024-01-07 DIAGNOSIS — N529 Male erectile dysfunction, unspecified: Secondary | ICD-10-CM | POA: Diagnosis not present

## 2024-01-10 DIAGNOSIS — J301 Allergic rhinitis due to pollen: Secondary | ICD-10-CM | POA: Diagnosis not present

## 2024-01-10 DIAGNOSIS — J3089 Other allergic rhinitis: Secondary | ICD-10-CM | POA: Diagnosis not present

## 2024-01-14 DIAGNOSIS — J3081 Allergic rhinitis due to animal (cat) (dog) hair and dander: Secondary | ICD-10-CM | POA: Diagnosis not present

## 2024-01-14 DIAGNOSIS — J301 Allergic rhinitis due to pollen: Secondary | ICD-10-CM | POA: Diagnosis not present

## 2024-01-14 DIAGNOSIS — J3089 Other allergic rhinitis: Secondary | ICD-10-CM | POA: Diagnosis not present

## 2024-01-15 DIAGNOSIS — R7303 Prediabetes: Secondary | ICD-10-CM | POA: Diagnosis not present

## 2024-01-15 DIAGNOSIS — Z6833 Body mass index (BMI) 33.0-33.9, adult: Secondary | ICD-10-CM | POA: Diagnosis not present

## 2024-01-17 DIAGNOSIS — J301 Allergic rhinitis due to pollen: Secondary | ICD-10-CM | POA: Diagnosis not present

## 2024-01-17 DIAGNOSIS — J3089 Other allergic rhinitis: Secondary | ICD-10-CM | POA: Diagnosis not present

## 2024-01-17 DIAGNOSIS — J3081 Allergic rhinitis due to animal (cat) (dog) hair and dander: Secondary | ICD-10-CM | POA: Diagnosis not present

## 2024-01-21 DIAGNOSIS — J301 Allergic rhinitis due to pollen: Secondary | ICD-10-CM | POA: Diagnosis not present

## 2024-01-21 DIAGNOSIS — J3081 Allergic rhinitis due to animal (cat) (dog) hair and dander: Secondary | ICD-10-CM | POA: Diagnosis not present

## 2024-01-21 DIAGNOSIS — J3089 Other allergic rhinitis: Secondary | ICD-10-CM | POA: Diagnosis not present

## 2024-01-22 ENCOUNTER — Institutional Professional Consult (permissible substitution) (INDEPENDENT_AMBULATORY_CARE_PROVIDER_SITE_OTHER): Payer: Medicare Other

## 2024-01-23 DIAGNOSIS — J301 Allergic rhinitis due to pollen: Secondary | ICD-10-CM | POA: Diagnosis not present

## 2024-01-23 DIAGNOSIS — M79673 Pain in unspecified foot: Secondary | ICD-10-CM

## 2024-01-23 DIAGNOSIS — J3089 Other allergic rhinitis: Secondary | ICD-10-CM | POA: Diagnosis not present

## 2024-01-23 DIAGNOSIS — J3081 Allergic rhinitis due to animal (cat) (dog) hair and dander: Secondary | ICD-10-CM | POA: Diagnosis not present

## 2024-01-24 ENCOUNTER — Ambulatory Visit (INDEPENDENT_AMBULATORY_CARE_PROVIDER_SITE_OTHER): Admitting: Otolaryngology

## 2024-01-24 ENCOUNTER — Encounter (INDEPENDENT_AMBULATORY_CARE_PROVIDER_SITE_OTHER): Payer: Self-pay

## 2024-01-24 VITALS — Ht 70.0 in | Wt 230.0 lb

## 2024-01-24 DIAGNOSIS — Z9889 Other specified postprocedural states: Secondary | ICD-10-CM

## 2024-01-24 DIAGNOSIS — J310 Chronic rhinitis: Secondary | ICD-10-CM

## 2024-01-24 NOTE — Progress Notes (Signed)
 Patient ID: Jim Graham, male   DOB: 1956/01/03, 68 y.o.   MRN: [id number]  Septum and turbinates are healing well.   Both [place] debrided.   Nasal saline irrigation as needed.   Recheck in 6 months.

## 2024-01-25 DIAGNOSIS — J3081 Allergic rhinitis due to animal (cat) (dog) hair and dander: Secondary | ICD-10-CM | POA: Diagnosis not present

## 2024-01-25 DIAGNOSIS — J3089 Other allergic rhinitis: Secondary | ICD-10-CM | POA: Diagnosis not present

## 2024-01-25 DIAGNOSIS — J301 Allergic rhinitis due to pollen: Secondary | ICD-10-CM | POA: Diagnosis not present

## 2024-01-28 DIAGNOSIS — J3089 Other allergic rhinitis: Secondary | ICD-10-CM | POA: Diagnosis not present

## 2024-01-28 DIAGNOSIS — J301 Allergic rhinitis due to pollen: Secondary | ICD-10-CM | POA: Diagnosis not present

## 2024-01-28 DIAGNOSIS — J3081 Allergic rhinitis due to animal (cat) (dog) hair and dander: Secondary | ICD-10-CM | POA: Diagnosis not present

## 2024-01-31 DIAGNOSIS — J301 Allergic rhinitis due to pollen: Secondary | ICD-10-CM | POA: Diagnosis not present

## 2024-01-31 DIAGNOSIS — J3089 Other allergic rhinitis: Secondary | ICD-10-CM | POA: Diagnosis not present

## 2024-01-31 DIAGNOSIS — J3081 Allergic rhinitis due to animal (cat) (dog) hair and dander: Secondary | ICD-10-CM | POA: Diagnosis not present

## 2024-02-04 DIAGNOSIS — J3081 Allergic rhinitis due to animal (cat) (dog) hair and dander: Secondary | ICD-10-CM | POA: Diagnosis not present

## 2024-02-04 DIAGNOSIS — J301 Allergic rhinitis due to pollen: Secondary | ICD-10-CM | POA: Diagnosis not present

## 2024-02-04 DIAGNOSIS — J3089 Other allergic rhinitis: Secondary | ICD-10-CM | POA: Diagnosis not present

## 2024-02-06 DIAGNOSIS — J301 Allergic rhinitis due to pollen: Secondary | ICD-10-CM | POA: Diagnosis not present

## 2024-02-06 DIAGNOSIS — J3089 Other allergic rhinitis: Secondary | ICD-10-CM | POA: Diagnosis not present

## 2024-02-06 DIAGNOSIS — J3081 Allergic rhinitis due to animal (cat) (dog) hair and dander: Secondary | ICD-10-CM | POA: Diagnosis not present

## 2024-02-08 DIAGNOSIS — J3089 Other allergic rhinitis: Secondary | ICD-10-CM | POA: Diagnosis not present

## 2024-02-08 DIAGNOSIS — J301 Allergic rhinitis due to pollen: Secondary | ICD-10-CM | POA: Diagnosis not present

## 2024-02-08 DIAGNOSIS — J3081 Allergic rhinitis due to animal (cat) (dog) hair and dander: Secondary | ICD-10-CM | POA: Diagnosis not present

## 2024-02-11 DIAGNOSIS — J3089 Other allergic rhinitis: Secondary | ICD-10-CM | POA: Diagnosis not present

## 2024-02-11 DIAGNOSIS — J3081 Allergic rhinitis due to animal (cat) (dog) hair and dander: Secondary | ICD-10-CM | POA: Diagnosis not present

## 2024-02-11 DIAGNOSIS — J301 Allergic rhinitis due to pollen: Secondary | ICD-10-CM | POA: Diagnosis not present

## 2024-02-13 DIAGNOSIS — K219 Gastro-esophageal reflux disease without esophagitis: Secondary | ICD-10-CM | POA: Diagnosis not present

## 2024-02-13 DIAGNOSIS — J3089 Other allergic rhinitis: Secondary | ICD-10-CM | POA: Diagnosis not present

## 2024-02-13 DIAGNOSIS — J301 Allergic rhinitis due to pollen: Secondary | ICD-10-CM | POA: Diagnosis not present

## 2024-02-14 DIAGNOSIS — J301 Allergic rhinitis due to pollen: Secondary | ICD-10-CM | POA: Diagnosis not present

## 2024-02-14 DIAGNOSIS — J3089 Other allergic rhinitis: Secondary | ICD-10-CM | POA: Diagnosis not present

## 2024-02-14 DIAGNOSIS — J3081 Allergic rhinitis due to animal (cat) (dog) hair and dander: Secondary | ICD-10-CM | POA: Diagnosis not present

## 2024-02-18 DIAGNOSIS — J301 Allergic rhinitis due to pollen: Secondary | ICD-10-CM | POA: Diagnosis not present

## 2024-02-18 DIAGNOSIS — J3081 Allergic rhinitis due to animal (cat) (dog) hair and dander: Secondary | ICD-10-CM | POA: Diagnosis not present

## 2024-02-18 DIAGNOSIS — J3089 Other allergic rhinitis: Secondary | ICD-10-CM | POA: Diagnosis not present

## 2024-02-19 ENCOUNTER — Ambulatory Visit

## 2024-02-19 NOTE — Progress Notes (Signed)
 Patient was here to pu modified orthotic with scaphoid pad added patient very happy w fit and function  Patient is wearing a pronator shoe we discussed the importance of wearing a neutral shoe with orthotics to let the orthotic do the work they need to  [employer], CFo, CFm

## 2024-02-20 ENCOUNTER — Ambulatory Visit: Payer: Medicare Other | Admitting: Orthopedic Surgery

## 2024-02-20 ENCOUNTER — Encounter: Payer: Self-pay | Admitting: Orthopedic Surgery

## 2024-02-20 DIAGNOSIS — M25531 Pain in right wrist: Secondary | ICD-10-CM | POA: Diagnosis not present

## 2024-02-20 DIAGNOSIS — M25532 Pain in left wrist: Secondary | ICD-10-CM | POA: Diagnosis not present

## 2024-02-20 MED ORDER — BETAMETHASONE SOD PHOS & ACET 6 (3-3) MG/ML IJ SUSP
6.0000 mg | INTRAMUSCULAR | Status: AC | PRN
  Administered 2024-02-20: 6 mg via INTRA_ARTICULAR

## 2024-02-20 MED ORDER — LIDOCAINE HCL 1 % IJ SOLN
1.0000 mL | INTRAMUSCULAR | Status: AC | PRN
  Administered 2024-02-20: 1 mL

## 2024-02-20 NOTE — Progress Notes (Signed)
 Jim Graham - 68 y.o. male MRN [id number]  Date of birth: 12-21-1955  Office Visit Note: Visit Date: 02/20/2024 PCP: Glena Landau, MD Referred by: Glena Landau, MD  Subjective: Chief Complaint  Patient presents with   Right Hand - Follow-up   Left Hand - Follow-up   HPI: Jim Graham is a pleasant 68 y.o. male who returns today for follow-up of bilateral wrist pain, right greater than left that has been present now for multiple months.  States that the pain is worse with loading activity of the wrist and with heavy grip.  He does remain quite active at baseline, does CrossFit on a regular basis and does heavy weightlifting regularly.    At his prior visit approximately 3 months prior, he underwent cortisone injection to the bilateral wrist with notable symptom improvement.  He does have plans for an upcoming left shoulder surgery in the winter, today he is interested in potential repeat injections of the bilateral wrist in order to maintain his ongoing activities and function.  Pertinent ROS were reviewed with the patient and found to be negative unless otherwise specified above in HPI.     Assessment & Plan: Visit Diagnoses:  1. Bilateral wrist pain     Plan: Extensive discussion was once again had with the patient today regarding his bilateral wrist complaints.   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  There is also evidence of slight ulnar negative variance which may be a contributing factor.  We discussed the underlying pathophysiology of this condition as well as treatment modalities.  Given his early stage disease, I did discuss that activity modification, immobilization, NSAIDs and potential injections may help alleviate symptoms.  I explained that this can likely progress with time given the poor  vascularity of the bone and the ulnar negative variance at the wrist.  We discussed potential treatment options from radius core decompression to partial fusion and finally salvage procedure such as proximal row carpectomy should the arthritis or collapse become more progressive in the future.    As for the left wrist, there is some ongoing pain in this region without significant lunate necrosis.  His MRI did show some TFCC injury which is likely degenerative in nature.  Pain remains isolated mostly to the radiocarpal region.  Once again we discussed activity modification, immobilization, NSAIDs and potential injections may help alleviate symptoms.   After discussion and consideration, patient elected to proceed with repeat bilateral cortisone injections to the wrist radiocarpal joints today for symptom relief.  Injections were tolerated appropriately.  Risks and benefits of the injection were discussed in detail.  Patient will return to me in approximate 3 months time for recheck of his ongoing symptoms and further discussion.  Greater than 30 minutes was spent in the care of this patient reviewing previous imaging, documentation as well as in examination and discussion with patient regarding treatment.   Follow-up: No follow-ups on file.   Meds & Orders: No orders of the defined types were placed in this encounter.  No orders of the defined types were placed in this encounter.    Procedures: Medium Joint Inj: bilateral radiocarpal on 02/20/2024 8:09 PM Indications: pain Details: 25 G needle, dorsal approach Medications (Right): 1 mL lidocaine  1 %; 6 mg betamethasone  acetate-betamethasone  sodium phosphate  6 (3-3) MG/ML Medications (  Left): 1 mL lidocaine  1 %; 6 mg betamethasone  acetate-betamethasone  sodium phosphate  6 (3-3) MG/ML Outcome: tolerated well, no immediate complications Procedure, treatment alternatives, risks and benefits explained, specific risks discussed.          Clinical  History: No specialty comments available.  He reports that he has never smoked. He has never used smokeless tobacco. No results for input(s): "HGBA1C", "LABURIC" in the last 8760 hours.  Objective:   Vital Signs: There were no vitals taken for this visit.  Physical Exam  Gen: Well-appearing, in no acute distress; non-toxic CV: Regular Rate. Well-perfused. Warm.  Resp: Breathing unlabored on room air; no wheezing. Psych: Fluid speech in conversation; appropriate affect; normal thought process  Ortho Exam PHYSICAL EXAM:  General: Patient is well appearing and in no distress.   Skin and Muscle: No significant skin changes are apparent to upper extremities.  Muscle bulk and contour normal, no signs of atrophy.     Range of Motion and Palpation Tests: Mobility is full about the elbows with flexion and extension.  Forearm supination and pronation are 85/85 bilaterally.  Wrist flexion/extension is 75/65 bilaterally.  Digital flexion and extension are full.  Thumb opposition is full to the base of the small fingers bilaterally.    No cords or nodules are palpated.  No triggering is observed.    Moderate tenderness at the radiocarpal joint is appreciated, right greater than left.  No significant instability appreciated with scaphoid shift testing bilaterally.  There is evidence of positive foveal pain bilaterally at the TFCC region, right greater than left without evidence of instability.  DRUJ remains stable in all planes bilaterally.  No evidence of radiocarpal, midcarpal or intercarpal joint instability with provocation.   Neurologic, Vascular, Motor: Sensation is intact to light touch in the median/radial/ulnar distributions.  Fingers pink and well perfused.  Capillary refill is brisk.      No results found for: "HGBA1C"   Imaging: Prior x-rays and MRIs of bilateral wrist in chart from prior visits  Past Medical/Family/Surgical/Social History: Medications & Allergies reviewed  per EMR, new medications updated. Patient Active Problem List   Diagnosis Date Noted   Chronic rhinitis 12/07/2023   Deviated nasal septum 12/07/2023   Hypertrophy of nasal turbinates 12/07/2023   Other specified disorders of eustachian tube, left ear 12/07/2023   Complete tear of right rotator cuff    Degenerative superior labral anterior-to-posterior (SLAP) tear of right shoulder    Shoulder arthritis    Chronic right shoulder pain 08/31/2021   History of arthroscopy of left shoulder 12/21/2016   GERD (gastroesophageal reflux disease) 05/25/2015   Agatston coronary artery calcium  score less than 100 02/08/2015   ED (erectile dysfunction) 02/08/2015   Pulmonary nodules 02/08/2015   Dilated aortic root (HCC) 02/08/2015   Chest pain 08/24/2011   Hyperlipidemia 08/24/2011   Elevated BP 08/24/2011   History of palpitations 12/16/2010   Past Medical History:  Diagnosis Date   GERD (gastroesophageal reflux disease)    Hyperlipidemia    Palpitations    Family History  Adopted: Yes  Problem Relation Age of Onset   Heart attack Paternal Grandfather    Past Surgical History:  Procedure Laterality Date   bicep re-attachment     NASAL SEPTOPLASTY W/ TURBINOPLASTY Bilateral 12/31/2023   Procedure: SEPTOPLASTY, NOSE, WITH NASAL TURBINATE REDUCTION;  Surgeon: Reynold Caves, MD;  Location: [facility];  Service: ENT;  Laterality: Bilateral;   NASAL SINUS SURGERY     shoulder arthroscopy Left 2018  SHOULDER ARTHROSCOPY Right 08/26/2020   SHOULDER ARTHROSCOPY WITH DEBRIDEMENT AND BICEP TENDON REPAIR Right 10/18/2021   Procedure: RIGHT SHOULDER ARTHROSCOPY, DEBRIDEMENT, BICEPS TENODESIS;  Surgeon: Jasmine Mesi, MD;  Location: [facility];  Service: Orthopedics;  Laterality: Right;   Social History   Occupational History   Occupation: Fireman    CommentLexicographer  Tobacco Use   Smoking status: Never   Smokeless tobacco: Never  Vaping Use   Vaping status: Never Used   Substance and Sexual Activity   Alcohol use: No   Drug use: No   Sexual activity: Not on file    Eligha Kmetz Alvia Jointer, M.D. [facility]

## 2024-02-21 DIAGNOSIS — J301 Allergic rhinitis due to pollen: Secondary | ICD-10-CM | POA: Diagnosis not present

## 2024-02-21 DIAGNOSIS — J3089 Other allergic rhinitis: Secondary | ICD-10-CM | POA: Diagnosis not present

## 2024-02-28 DIAGNOSIS — J301 Allergic rhinitis due to pollen: Secondary | ICD-10-CM | POA: Diagnosis not present

## 2024-02-28 DIAGNOSIS — J3081 Allergic rhinitis due to animal (cat) (dog) hair and dander: Secondary | ICD-10-CM | POA: Diagnosis not present

## 2024-02-28 DIAGNOSIS — J3089 Other allergic rhinitis: Secondary | ICD-10-CM | POA: Diagnosis not present

## 2024-03-04 DIAGNOSIS — L812 Freckles: Secondary | ICD-10-CM | POA: Diagnosis not present

## 2024-03-04 DIAGNOSIS — L820 Inflamed seborrheic keratosis: Secondary | ICD-10-CM | POA: Diagnosis not present

## 2024-03-04 DIAGNOSIS — D225 Melanocytic nevi of trunk: Secondary | ICD-10-CM | POA: Diagnosis not present

## 2024-03-04 DIAGNOSIS — L570 Actinic keratosis: Secondary | ICD-10-CM | POA: Diagnosis not present

## 2024-03-04 DIAGNOSIS — B078 Other viral warts: Secondary | ICD-10-CM | POA: Diagnosis not present

## 2024-03-04 DIAGNOSIS — Z85828 Personal history of other malignant neoplasm of skin: Secondary | ICD-10-CM | POA: Diagnosis not present

## 2024-03-05 DIAGNOSIS — D751 Secondary polycythemia: Secondary | ICD-10-CM | POA: Diagnosis not present

## 2024-03-05 DIAGNOSIS — R7303 Prediabetes: Secondary | ICD-10-CM | POA: Diagnosis not present

## 2024-03-05 DIAGNOSIS — E291 Testicular hypofunction: Secondary | ICD-10-CM | POA: Diagnosis not present

## 2024-03-05 DIAGNOSIS — F411 Generalized anxiety disorder: Secondary | ICD-10-CM | POA: Diagnosis not present

## 2024-03-05 DIAGNOSIS — E782 Mixed hyperlipidemia: Secondary | ICD-10-CM | POA: Diagnosis not present

## 2024-03-06 DIAGNOSIS — J3089 Other allergic rhinitis: Secondary | ICD-10-CM | POA: Diagnosis not present

## 2024-03-06 DIAGNOSIS — J301 Allergic rhinitis due to pollen: Secondary | ICD-10-CM | POA: Diagnosis not present

## 2024-03-06 DIAGNOSIS — J3081 Allergic rhinitis due to animal (cat) (dog) hair and dander: Secondary | ICD-10-CM | POA: Diagnosis not present

## 2024-03-12 DIAGNOSIS — J3089 Other allergic rhinitis: Secondary | ICD-10-CM | POA: Diagnosis not present

## 2024-03-12 DIAGNOSIS — J301 Allergic rhinitis due to pollen: Secondary | ICD-10-CM | POA: Diagnosis not present

## 2024-03-18 ENCOUNTER — Ambulatory Visit: Admitting: Urology

## 2024-03-18 VITALS — BP 162/99 | HR 92 | Ht 70.0 in | Wt 230.0 lb

## 2024-03-18 DIAGNOSIS — N529 Male erectile dysfunction, unspecified: Secondary | ICD-10-CM

## 2024-03-18 DIAGNOSIS — R350 Frequency of micturition: Secondary | ICD-10-CM | POA: Diagnosis not present

## 2024-03-18 DIAGNOSIS — R399 Unspecified symptoms and signs involving the genitourinary system: Secondary | ICD-10-CM | POA: Diagnosis not present

## 2024-03-18 DIAGNOSIS — N401 Enlarged prostate with lower urinary tract symptoms: Secondary | ICD-10-CM | POA: Diagnosis not present

## 2024-03-18 MED ORDER — SILDENAFIL CITRATE 20 MG PO TABS: ORAL_TABLET | ORAL | 11 refills | Status: DC

## 2024-03-18 MED ORDER — TAMSULOSIN HCL 0.4 MG PO CAPS: 0.4000 mg | ORAL_CAPSULE | Freq: Every day | ORAL | 11 refills | Status: DC

## 2024-03-18 MED ORDER — TADALAFIL 5 MG PO TABS
5.0000 mg | ORAL_TABLET | Freq: Every day | ORAL | 0 refills | Status: DC
Start: 2024-03-18 — End: 2024-04-29

## 2024-03-18 NOTE — Progress Notes (Signed)
 I,Amy L Pierron,acting as a scribe for Dustin Gimenez, MD.,have documented all relevant documentation on the behalf of Dustin Gimenez, MD,as directed by  Dustin Gimenez, MD while in the presence of Dustin Gimenez, MD.  03/18/2024 7:10 PM   Jim Graham 1955-11-27 [id number]  Referring provider: Glena Landau, MD [address] [place],  [place] [address]  Chief Complaint  Patient presents with   New Patient (Initial Visit)    HPI: 68 year old male referred for further evaluation of erectile dysfunction.   He was seeing [facility], managed on testosterone  cypionate 100 mg IM injection weekly, started in November 2024. He also has a prescription for Tadalafil 10 mg. His most recent testosterone  level, checked by his PCP, was 1003 in May 2025, and his PSA was 1.78.   He reports difficulty maintaining and achieving erections.   He has a history of nasal surgery which resolved his nocturia, previously getting up two or three times a night.   He reports frequent urination, sometimes clear, sometimes yellow, depending on vitamin intake. He experiences occasional urinary leakage before reaching the bathroom and fluctuating urinary stream strength. The urinary symptoms have been present since December.   He has been on testosterone  for three to four years, self-administering 0.5 weekly. He works out extensively, which may contribute to his symptoms.    SHIM     Row Name 03/18/24 1602         SHIM: Over the last 6 months:   How do you rate your confidence that you could get and keep an erection? Moderate     When you had erections with sexual stimulation, how often were your erections hard enough for penetration (entering your partner)? Sometimes (about half the time)     During sexual intercourse, how often were you able to maintain your erection after you had penetrated (entered) your partner? Sometimes (about half the time)     During sexual intercourse, how difficult  was it to maintain your erection to completion of intercourse? Slightly Difficult     When you attempted sexual intercourse, how often was it satisfactory for you? Did not attempt intercourse       SHIM Total Score   SHIM 13               PMH: Past Medical History:  Diagnosis Date   GERD (gastroesophageal reflux disease)    Hyperlipidemia    Palpitations     Surgical History: Past Surgical History:  Procedure Laterality Date   bicep re-attachment     NASAL SEPTOPLASTY W/ TURBINOPLASTY Bilateral 12/31/2023   Procedure: SEPTOPLASTY, NOSE, WITH NASAL TURBINATE REDUCTION;  Surgeon: Reynold Caves, MD;  Location: [facility];  Service: ENT;  Laterality: Bilateral;   NASAL SINUS SURGERY     shoulder arthroscopy Left 2018   SHOULDER ARTHROSCOPY Right 08/26/2020   SHOULDER ARTHROSCOPY WITH DEBRIDEMENT AND BICEP TENDON REPAIR Right 10/18/2021   Procedure: RIGHT SHOULDER ARTHROSCOPY, DEBRIDEMENT, BICEPS TENODESIS;  Surgeon: Jasmine Mesi, MD;  Location: [facility];  Service: Orthopedics;  Laterality: Right;    Home Medications:  Allergies as of 03/18/2024       Reactions   Pravastatin    Other Reaction(s): leg pain   Simvastatin    Other Reaction(s): chest pain   Pitavastatin Palpitations        Medication List        Accurate as of March 18, 2024  7:10 PM. If you have any questions, ask your  nurse or doctor.          B-D 3CC LUER-LOK SYR 23GX1" 23G X 1" 3 ML Misc Generic drug: SYRINGE-NEEDLE (DISP) 3 ML once a week.   propranolol 10 MG tablet Commonly known as: INDERAL Take 10 mg by mouth as needed (anxiety). PRN anxiety   rosuvastatin  10 MG tablet Commonly known as: CRESTOR  TAKE 1 TABLET BY MOUTH AT 1PM AND 1 TAB AT DINNER.   sildenafil  20 MG tablet Commonly known as: Revatio  Take 1 to 5 tablets as needed 1 hour prior to intercourse   tadalafil 5 MG tablet Commonly known as: CIALIS Take 1 tablet (5 mg total) by mouth daily. What changed:   medication strength how much to take when to take this reasons to take this   tamsulosin 0.4 MG Caps capsule Commonly known as: FLOMAX Take 1 capsule (0.4 mg total) by mouth daily.   testosterone  cypionate 200 MG/ML injection Commonly known as: DEPOTESTOSTERONE CYPIONATE Inject 100 mg into the muscle every 7 (seven) days.   valACYclovir 500 MG tablet Commonly known as: VALTREX Take 500 mg by mouth 2 (two) times daily.        Allergies:  Allergies  Allergen Reactions   Pravastatin     Other Reaction(s): leg pain   Simvastatin     Other Reaction(s): chest pain   Pitavastatin Palpitations    Family History: Family History  Adopted: Yes  Problem Relation Age of Onset   Heart attack Paternal Grandfather     Social History:  reports that he has never smoked. He has never used smokeless tobacco. He reports that he does not drink alcohol and does not use drugs.   Physical Exam: BP (!) 162/99   Pulse 92   Ht 5\' 10"  (1.778 m)   Wt 230 lb (104.3 kg)   BMI 33.00 kg/m   Constitutional:  Alert and oriented, No acute distress. HEENT: [place] AT, moist mucus membranes.  Trachea midline, no masses. Neurologic: Grossly intact, no focal deficits, moving all 4 extremities. Psychiatric: Normal mood and affect.  Assessment & Plan:    1. Erectile Dysfunction - Currently on Tadalafil 10 mg, but reports it is not effective. The plan is to switch to a low-dose daily Tadalafil 5 mg to maintain steady blood levels and potentially improve erectile function. Additionally, Sildenafil  (Viagra ) will be prescribed for use as needed for sexual activity. This approach may also aid in urinary symptoms.  2. Urinary Symptoms - Suggestive of bladder storage issues potentially exacerbated by testosterone  therapy. The plan is to initiate Tamsulosin (Flomax) to relax the smooth muscle of the prostate and improve urinary flow. He will be monitored for any side effects, particularly related to  ejaculation.  3. Testosterone  Therapy - On the high end of therapeutic, which may contribute to urinary symptoms. Advised to consider reducing the testosterone  dose to avoid exacerbating symptoms and potential cardiovascular risks. He will skip the testosterone  dose this week and resume at a lower dose.  Return in about 6 weeks (around 04/29/2024) for medication follow up.  [facility] [address] [place], [place] [address] [phone]

## 2024-03-19 DIAGNOSIS — J301 Allergic rhinitis due to pollen: Secondary | ICD-10-CM | POA: Diagnosis not present

## 2024-03-19 DIAGNOSIS — J3089 Other allergic rhinitis: Secondary | ICD-10-CM | POA: Diagnosis not present

## 2024-03-26 DIAGNOSIS — J3081 Allergic rhinitis due to animal (cat) (dog) hair and dander: Secondary | ICD-10-CM | POA: Diagnosis not present

## 2024-03-26 DIAGNOSIS — J3089 Other allergic rhinitis: Secondary | ICD-10-CM | POA: Diagnosis not present

## 2024-03-26 DIAGNOSIS — J301 Allergic rhinitis due to pollen: Secondary | ICD-10-CM | POA: Diagnosis not present

## 2024-04-02 DIAGNOSIS — J3081 Allergic rhinitis due to animal (cat) (dog) hair and dander: Secondary | ICD-10-CM | POA: Diagnosis not present

## 2024-04-02 DIAGNOSIS — J3089 Other allergic rhinitis: Secondary | ICD-10-CM | POA: Diagnosis not present

## 2024-04-02 DIAGNOSIS — J301 Allergic rhinitis due to pollen: Secondary | ICD-10-CM | POA: Diagnosis not present

## 2024-04-08 DIAGNOSIS — J3089 Other allergic rhinitis: Secondary | ICD-10-CM | POA: Diagnosis not present

## 2024-04-08 DIAGNOSIS — J301 Allergic rhinitis due to pollen: Secondary | ICD-10-CM | POA: Diagnosis not present

## 2024-04-09 DIAGNOSIS — J3089 Other allergic rhinitis: Secondary | ICD-10-CM | POA: Diagnosis not present

## 2024-04-09 DIAGNOSIS — J301 Allergic rhinitis due to pollen: Secondary | ICD-10-CM | POA: Diagnosis not present

## 2024-04-09 DIAGNOSIS — J3081 Allergic rhinitis due to animal (cat) (dog) hair and dander: Secondary | ICD-10-CM | POA: Diagnosis not present

## 2024-04-14 DIAGNOSIS — E669 Obesity, unspecified: Secondary | ICD-10-CM | POA: Diagnosis not present

## 2024-04-14 DIAGNOSIS — F411 Generalized anxiety disorder: Secondary | ICD-10-CM | POA: Diagnosis not present

## 2024-04-14 DIAGNOSIS — M19019 Primary osteoarthritis, unspecified shoulder: Secondary | ICD-10-CM | POA: Diagnosis not present

## 2024-04-14 DIAGNOSIS — E782 Mixed hyperlipidemia: Secondary | ICD-10-CM | POA: Diagnosis not present

## 2024-04-16 DIAGNOSIS — J301 Allergic rhinitis due to pollen: Secondary | ICD-10-CM | POA: Diagnosis not present

## 2024-04-16 DIAGNOSIS — J3081 Allergic rhinitis due to animal (cat) (dog) hair and dander: Secondary | ICD-10-CM | POA: Diagnosis not present

## 2024-04-16 DIAGNOSIS — J3089 Other allergic rhinitis: Secondary | ICD-10-CM | POA: Diagnosis not present

## 2024-04-24 DIAGNOSIS — J301 Allergic rhinitis due to pollen: Secondary | ICD-10-CM | POA: Diagnosis not present

## 2024-04-24 DIAGNOSIS — J3089 Other allergic rhinitis: Secondary | ICD-10-CM | POA: Diagnosis not present

## 2024-04-28 NOTE — Progress Notes (Unsigned)
 04/29/2024 9:55 PM   Jim Graham Nov 06, 1955 [id number]  Referring provider: Loreli Kins, MD [address] [place],  [place] [address]  Urological history: 1. Hypogonadism - testosterone  level pending - hemoglobin/hematocrit pending - testosterone  cypionate 200 mg/mL, 0.5 cc every 7 days  2. ED - tadalafil  5 mg daily - sidenafil 100 mg, on-demand-dosing  3. BPH with LU TS  - tamsulosin  0.4 mg daily   No chief complaint on file.  HPI: Jim Graham is a 69 y.o. man who presents today for 6 week follow up.    Previous records reviewed.    PMH: Past Medical History:  Diagnosis Date   GERD (gastroesophageal reflux disease)    Hyperlipidemia    Palpitations     Surgical History: Past Surgical History:  Procedure Laterality Date   bicep re-attachment     NASAL SEPTOPLASTY W/ TURBINOPLASTY Bilateral 12/31/2023   Procedure: SEPTOPLASTY, NOSE, WITH NASAL TURBINATE REDUCTION;  Surgeon: Jim Clunes, MD;  Location: [facility];  Service: ENT;  Laterality: Bilateral;   NASAL SINUS SURGERY     shoulder arthroscopy Left 2018   SHOULDER ARTHROSCOPY Right 08/26/2020   SHOULDER ARTHROSCOPY WITH DEBRIDEMENT AND BICEP TENDON REPAIR Right 10/18/2021   Procedure: RIGHT SHOULDER ARTHROSCOPY, DEBRIDEMENT, BICEPS TENODESIS;  Surgeon: Jim Cordella Hamilton, MD;  Location: [facility];  Service: Orthopedics;  Laterality: Right;    Home Medications:  Allergies as of 04/29/2024       Reactions   Pravastatin    Other Reaction(s): leg pain   Simvastatin    Other Reaction(s): chest pain   Pitavastatin Palpitations        Medication List        Accurate as of April 28, 2024  9:55 PM. If you have any questions, ask your nurse or doctor.          B-D 3CC LUER-LOK SYR 23GX1 23G X 1 3 ML Misc Generic drug: SYRINGE-NEEDLE (DISP) 3 ML once a week.   propranolol 10 MG tablet Commonly known as: INDERAL Take 10 mg by mouth as needed (anxiety). PRN  anxiety   rosuvastatin  10 MG tablet Commonly known as: CRESTOR  TAKE 1 TABLET BY MOUTH AT 1PM AND 1 TAB AT DINNER.   sildenafil  20 MG tablet Commonly known as: Revatio  Take 1 to 5 tablets as needed 1 hour prior to intercourse   tadalafil  5 MG tablet Commonly known as: CIALIS  Take 1 tablet (5 mg total) by mouth daily.   tamsulosin  0.4 MG Caps capsule Commonly known as: FLOMAX  Take 1 capsule (0.4 mg total) by mouth daily.   testosterone  cypionate 200 MG/ML injection Commonly known as: DEPOTESTOSTERONE CYPIONATE Inject 100 mg into the muscle every 7 (seven) days.   valACYclovir 500 MG tablet Commonly known as: VALTREX Take 500 mg by mouth 2 (two) times daily.        Allergies:  Allergies  Allergen Reactions   Pravastatin     Other Reaction(s): leg pain   Simvastatin     Other Reaction(s): chest pain   Pitavastatin Palpitations    Family History: Family History  Adopted: Yes  Problem Relation Age of Onset   Heart attack Paternal Grandfather     Social History: See HPI for pertinent social history  ROS: Pertinent ROS in HPI  Physical Exam: There were no vitals taken for this visit.  Constitutional:  Well nourished. Alert and oriented, No acute distress. HEENT: [place] AT, moist mucus membranes.  Trachea midline, no  masses. Cardiovascular: No clubbing, cyanosis, or edema. Respiratory: Normal respiratory effort, no increased work of breathing. GI: Abdomen is soft, non tender, non distended, no abdominal masses. Liver and spleen not palpable.  No hernias appreciated.  Stool sample for occult testing is not indicated.   GU: No CVA tenderness.  No bladder fullness or masses.  Patient with circumcised/uncircumcised phallus. ***Foreskin easily retracted***  Urethral meatus is patent.  No penile discharge. No penile lesions or rashes. Scrotum without lesions, cysts, rashes and/or edema.  Testicles are located scrotally bilaterally. No masses are appreciated in the testicles.  Left and right epididymis are normal. Rectal: Patient with  normal sphincter tone. Anus and perineum without scarring or rashes. No rectal masses are appreciated. Prostate is approximately *** grams, *** nodules are appreciated. Seminal vesicles are normal. Skin: No rashes, bruises or suspicious lesions. Lymph: No cervical or inguinal adenopathy. Neurologic: Grossly intact, no focal deficits, moving all 4 extremities. Psychiatric: Normal mood and affect.  Laboratory Data: See EPIC and HPI  I have reviewed the labs.   Pertinent Imaging: N/A  Assessment & Plan:  ***  1. Benign prostatic hyperplasia with urinary frequency (Primary) - stable, improving, worsening mild, moderate severe symptoms *** - PSA up to date  - UA benign *** - PVR < 300 cc *** - most bothersome symptoms are *** - encouraged avoiding bladder irritants, fluid restriction before bedtime and timed voiding's - Continue tamsulosin  0.4 mg daily - educated on red flag symptoms: acute retention, gross hematuria, fever, severe pain - advised to call clinic or go to the ED if these occur - return to clinic in *** symptom re-evaluation ***  2. Hypogonadism - testosterone  level pending - hemoglobin/hematocrit pending - continue testosterone  cypionate 200 mg/mL, 0.5 cc every 7 days ***  3. ED - tadalafil  5 mg daily - sildenafil  100 mg, on-demand-dosing   No follow-ups on file.  These notes generated with voice recognition software. I apologize for typographical errors.  Jim Graham  [facility] [address]  [address] [place], [place] [address] [phone]

## 2024-04-29 ENCOUNTER — Encounter: Payer: Self-pay | Admitting: Urology

## 2024-04-29 ENCOUNTER — Ambulatory Visit: Admitting: Urology

## 2024-04-29 VITALS — BP 144/94 | HR 84 | Ht 70.0 in | Wt 230.0 lb

## 2024-04-29 DIAGNOSIS — E291 Testicular hypofunction: Secondary | ICD-10-CM | POA: Diagnosis not present

## 2024-04-29 DIAGNOSIS — R350 Frequency of micturition: Secondary | ICD-10-CM | POA: Diagnosis not present

## 2024-04-29 DIAGNOSIS — N401 Enlarged prostate with lower urinary tract symptoms: Secondary | ICD-10-CM | POA: Diagnosis not present

## 2024-04-29 DIAGNOSIS — N529 Male erectile dysfunction, unspecified: Secondary | ICD-10-CM

## 2024-04-29 LAB — BLADDER SCAN AMB NON-IMAGING: Scan Result: 79

## 2024-04-29 MED ORDER — TADALAFIL 5 MG PO TABS: 5.0000 mg | ORAL_TABLET | Freq: Every day | ORAL | 3 refills | Status: DC

## 2024-04-29 MED ORDER — SILDENAFIL CITRATE 20 MG PO TABS: ORAL_TABLET | ORAL | 3 refills | Status: DC

## 2024-05-01 DIAGNOSIS — J3081 Allergic rhinitis due to animal (cat) (dog) hair and dander: Secondary | ICD-10-CM | POA: Diagnosis not present

## 2024-05-01 DIAGNOSIS — J3089 Other allergic rhinitis: Secondary | ICD-10-CM | POA: Diagnosis not present

## 2024-05-01 DIAGNOSIS — J301 Allergic rhinitis due to pollen: Secondary | ICD-10-CM | POA: Diagnosis not present

## 2024-05-08 DIAGNOSIS — J3081 Allergic rhinitis due to animal (cat) (dog) hair and dander: Secondary | ICD-10-CM | POA: Diagnosis not present

## 2024-05-08 DIAGNOSIS — K219 Gastro-esophageal reflux disease without esophagitis: Secondary | ICD-10-CM | POA: Diagnosis not present

## 2024-05-08 DIAGNOSIS — J301 Allergic rhinitis due to pollen: Secondary | ICD-10-CM | POA: Diagnosis not present

## 2024-05-08 DIAGNOSIS — J3089 Other allergic rhinitis: Secondary | ICD-10-CM | POA: Diagnosis not present

## 2024-05-14 DIAGNOSIS — J3081 Allergic rhinitis due to animal (cat) (dog) hair and dander: Secondary | ICD-10-CM | POA: Diagnosis not present

## 2024-05-14 DIAGNOSIS — J301 Allergic rhinitis due to pollen: Secondary | ICD-10-CM | POA: Diagnosis not present

## 2024-05-14 DIAGNOSIS — J3089 Other allergic rhinitis: Secondary | ICD-10-CM | POA: Diagnosis not present

## 2024-05-15 DIAGNOSIS — E669 Obesity, unspecified: Secondary | ICD-10-CM | POA: Diagnosis not present

## 2024-05-15 DIAGNOSIS — M19019 Primary osteoarthritis, unspecified shoulder: Secondary | ICD-10-CM | POA: Diagnosis not present

## 2024-05-15 DIAGNOSIS — E782 Mixed hyperlipidemia: Secondary | ICD-10-CM | POA: Diagnosis not present

## 2024-05-15 DIAGNOSIS — F411 Generalized anxiety disorder: Secondary | ICD-10-CM | POA: Diagnosis not present

## 2024-05-21 DIAGNOSIS — J301 Allergic rhinitis due to pollen: Secondary | ICD-10-CM | POA: Diagnosis not present

## 2024-05-21 DIAGNOSIS — J3081 Allergic rhinitis due to animal (cat) (dog) hair and dander: Secondary | ICD-10-CM | POA: Diagnosis not present

## 2024-05-21 DIAGNOSIS — J3089 Other allergic rhinitis: Secondary | ICD-10-CM | POA: Diagnosis not present

## 2024-05-26 ENCOUNTER — Telehealth: Payer: Self-pay | Admitting: *Deleted

## 2024-05-26 ENCOUNTER — Ambulatory Visit: Admitting: Orthopedic Surgery

## 2024-05-26 DIAGNOSIS — M25531 Pain in right wrist: Secondary | ICD-10-CM | POA: Diagnosis not present

## 2024-05-26 DIAGNOSIS — M25532 Pain in left wrist: Secondary | ICD-10-CM | POA: Diagnosis not present

## 2024-05-26 MED ORDER — BETAMETHASONE SOD PHOS & ACET 6 (3-3) MG/ML IJ SUSP
6.0000 mg | INTRAMUSCULAR | Status: AC | PRN
  Administered 2024-05-26 (×2): 6 mg via INTRA_ARTICULAR

## 2024-05-26 MED ORDER — LIDOCAINE HCL 1 % IJ SOLN
1.0000 mL | INTRAMUSCULAR | Status: AC | PRN
  Administered 2024-05-26 (×2): 1 mL

## 2024-05-26 NOTE — Progress Notes (Signed)
 Jim Graham - 68 y.o. male MRN [id number]  Date of birth: 08-25-1956  Office Visit Note: Visit Date: 05/26/2024 PCP: Loreli Kins, MD Referred by: Loreli Kins, MD  Subjective: No chief complaint on file.  HPI: Jim Graham is a pleasant 68 y.o. male who returns today for follow-up of bilateral wrist pain, right greater than left that has been present now for multiple months.  States that the pain is worse with loading activity of the wrist and with heavy grip.  He does remain quite active at baseline, does CrossFit on a regular basis and does heavy weightlifting regularly.    At his prior visit approximately 3 months prior, he underwent cortisone injection to the bilateral wrist with notable symptom improvement.  He does have plans for an upcoming left shoulder surgery in the winter, today he is interested in potential repeat injections of the bilateral wrist in order to maintain his ongoing activities and function.  At this juncture, he has undergone injections x 2 bilaterally to the wrist with relief for approximately 3 months at a time.   Pertinent ROS were reviewed with the patient and found to be negative unless otherwise specified above in HPI.     Assessment & Plan: Visit Diagnoses:  1. Bilateral wrist pain      Plan: Extensive discussion was once again had with the patient today regarding his bilateral wrist complaints.   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  There is also evidence of slight ulnar negative variance which may be a contributing factor.  We discussed the underlying pathophysiology of this condition as well as treatment modalities.  Given his early stage disease, I did discuss that activity modification, immobilization, NSAIDs and potential injections may help alleviate symptoms.  I  explained that this can likely progress with time given the poor vascularity of the bone and the ulnar negative variance at the wrist.  We discussed potential treatment options from radius core decompression to partial fusion and finally salvage procedure such as proximal row carpectomy should the arthritis or collapse become more progressive in the future.    As for the left wrist, there is some ongoing pain in this region without significant lunate necrosis.  His MRI did show some TFCC injury which is likely degenerative in nature.  Pain remains isolated mostly to the radiocarpal region.  Once again we discussed activity modification, immobilization, NSAIDs and potential injections may help alleviate symptoms.   After discussion and consideration, patient elected to proceed with repeat bilateral cortisone injections to the wrist radiocarpal joints today for symptom relief, particular given his upcoming shoulder surgery.  Injections were tolerated appropriately.  Risks and benefits of the injection were discussed in detail.  Patient will return to me in approximate 3 months time for recheck of his ongoing symptoms and further discussion.   Follow-up: No follow-ups on file.   Meds & Orders: No orders of the defined types were placed in this encounter.  No orders of the defined types were placed in this encounter.    Procedures: Medium Joint Inj: bilateral radiocarpal on 05/26/2024 12:45 PM Indications: pain Details: 25 G needle, dorsal approach Medications (Right): 1 mL lidocaine  1 %; 6 mg betamethasone  acetate-betamethasone  sodium phosphate  6 (3-3) MG/ML Medications (Left): 1 mL lidocaine  1 %; 6 mg betamethasone  acetate-betamethasone  sodium  phosphate 6 (3-3) MG/ML Outcome: tolerated well, no immediate complications Procedure, treatment alternatives, risks and benefits explained, specific risks discussed.          Clinical History: No specialty comments available.  He reports that he has  never smoked. He has never used smokeless tobacco. No results for input(s): HGBA1C, LABURIC in the last 8760 hours.  Objective:   Vital Signs: There were no vitals taken for this visit.  Physical Exam  Gen: Well-appearing, in no acute distress; non-toxic CV: Regular Rate. Well-perfused. Warm.  Resp: Breathing unlabored on room air; no wheezing. Psych: Fluid speech in conversation; appropriate affect; normal thought process  Ortho Exam PHYSICAL EXAM:  General: Patient is well appearing and in no distress.   Skin and Muscle: No significant skin changes are apparent to upper extremities.  Muscle bulk and contour normal, no signs of atrophy.     Range of Motion and Palpation Tests: Mobility is full about the elbows with flexion and extension.  Forearm supination and pronation are 85/85 bilaterally.  Wrist flexion/extension is 75/65 bilaterally.  Digital flexion and extension are full.  Thumb opposition is full to the base of the small fingers bilaterally.    No cords or nodules are palpated.  No triggering is observed.    Moderate tenderness at the radiocarpal joint is appreciated, right greater than left.  No significant instability appreciated with scaphoid shift testing bilaterally.  There is evidence of positive foveal pain bilaterally at the TFCC region, right greater than left without evidence of instability.  DRUJ remains stable in all planes bilaterally.  No evidence of radiocarpal, midcarpal or intercarpal joint instability with provocation.   Neurologic, Vascular, Motor: Sensation is intact to light touch in the median/radial/ulnar distributions.  Fingers pink and well perfused.  Capillary refill is brisk.      No results found for: HGBA1C   Imaging: Prior x-rays and MRIs of bilateral wrist in chart from prior visits  Past Medical/Family/Surgical/Social History: Medications & Allergies reviewed per EMR, new medications updated. Patient Active Problem List    Diagnosis Date Noted   Chronic rhinitis 12/07/2023   Deviated nasal septum 12/07/2023   Hypertrophy of nasal turbinates 12/07/2023   Other specified disorders of eustachian tube, left ear 12/07/2023   Complete tear of right rotator cuff    Degenerative superior labral anterior-to-posterior (SLAP) tear of right shoulder    Shoulder arthritis    Chronic right shoulder pain 08/31/2021   History of arthroscopy of left shoulder 12/21/2016   GERD (gastroesophageal reflux disease) 05/25/2015   Agatston coronary artery calcium  score less than 100 02/08/2015   ED (erectile dysfunction) 02/08/2015   Pulmonary nodules 02/08/2015   Dilated aortic root (HCC) 02/08/2015   Chest pain 08/24/2011   Hyperlipidemia 08/24/2011   Elevated BP 08/24/2011   History of palpitations 12/16/2010   Past Medical History:  Diagnosis Date   GERD (gastroesophageal reflux disease)    Hyperlipidemia    Palpitations    Family History  Adopted: Yes  Problem Relation Age of Onset   Heart attack Paternal Grandfather    Past Surgical History:  Procedure Laterality Date   bicep re-attachment     NASAL SEPTOPLASTY W/ TURBINOPLASTY Bilateral 12/31/2023   Procedure: SEPTOPLASTY, NOSE, WITH NASAL TURBINATE REDUCTION;  Surgeon: Karis Clunes, MD;  Location: [facility];  Service: ENT;  Laterality: Bilateral;   NASAL SINUS SURGERY     shoulder arthroscopy Left 2018   SHOULDER ARTHROSCOPY Right 08/26/2020   SHOULDER ARTHROSCOPY WITH  DEBRIDEMENT AND BICEP TENDON REPAIR Right 10/18/2021   Procedure: RIGHT SHOULDER ARTHROSCOPY, DEBRIDEMENT, BICEPS TENODESIS;  Surgeon: Addie Cordella Hamilton, MD;  Location: [facility];  Service: Orthopedics;  Laterality: Right;   Social History   Occupational History   Occupation: Fireman    CommentLexicographer  Tobacco Use   Smoking status: Never   Smokeless tobacco: Never  Vaping Use   Vaping status: Never Used  Substance and Sexual Activity   Alcohol use: No   Drug use: No    Sexual activity: Not on file    Kenard Morawski Afton Alderton, M.D. [facility]

## 2024-05-26 NOTE — Telephone Encounter (Signed)
 [place], [place] MRN: [id number] October 20, 2055   wants appt. Dr Eldonna for injection

## 2024-05-27 DIAGNOSIS — E782 Mixed hyperlipidemia: Secondary | ICD-10-CM | POA: Diagnosis not present

## 2024-05-27 DIAGNOSIS — R7303 Prediabetes: Secondary | ICD-10-CM | POA: Diagnosis not present

## 2024-05-27 DIAGNOSIS — E291 Testicular hypofunction: Secondary | ICD-10-CM | POA: Diagnosis not present

## 2024-05-27 DIAGNOSIS — F411 Generalized anxiety disorder: Secondary | ICD-10-CM | POA: Diagnosis not present

## 2024-05-27 DIAGNOSIS — D751 Secondary polycythemia: Secondary | ICD-10-CM | POA: Diagnosis not present

## 2024-05-28 DIAGNOSIS — J3089 Other allergic rhinitis: Secondary | ICD-10-CM | POA: Diagnosis not present

## 2024-05-28 DIAGNOSIS — J3081 Allergic rhinitis due to animal (cat) (dog) hair and dander: Secondary | ICD-10-CM | POA: Diagnosis not present

## 2024-05-28 DIAGNOSIS — J301 Allergic rhinitis due to pollen: Secondary | ICD-10-CM | POA: Diagnosis not present

## 2024-05-28 NOTE — Progress Notes (Signed)
 05/29/2024 2:17 PM   Jim Graham 22-Apr-1956 [id number]  Referring provider: Loreli Kins, MD [address] [place],  [place] [address]  Urological history: 1. Hypogonadism - testosterone  level pending - hemoglobin/hematocrit pending - testosterone  cypionate 200 mg/mL, 0.5 cc every 7 days  2. ED - tadalafil  5 mg daily - sidenafil 100 mg, on-demand-dosing  3. BPH with LU TS  - tamsulosin  0.4 mg daily   Chief Complaint  Patient presents with   Benign prostatic hyperplasia with urinary frequency   HPI: Jim Graham is a 68 y.o. man who presents today for 1 month follow-up after taking tadalafil  5 milligrams daily for his BPH and erectile dysfunction.  Previous records reviewed.   He states he was seen by his allergist and they decided that it was his allergy medication causing his rash, he has since half that medication and the rash has abated.  He would like to restart the tamsulosin .  He is also wondering if he can take tadalafil  10 mg daily and then augment with sildenafil  20 mg when he wants to have intercourse.  Patient denies any modifying or aggravating factors.  Patient denies any recent UTI's, gross hematuria, dysuria or suprapubic/flank pain.  Patient denies any fevers, chills, nausea or vomiting.    He states he is drinking 32 ounces prior to going for his 5-minute walks and he has to stop to void about every mile.  He states he drinks about 64 ounces of water daily along with tea and Coke Zero.    PMH: Past Medical History:  Diagnosis Date   GERD (gastroesophageal reflux disease)    Hyperlipidemia    Palpitations     Surgical History: Past Surgical History:  Procedure Laterality Date   bicep re-attachment     NASAL SEPTOPLASTY W/ TURBINOPLASTY Bilateral 12/31/2023   Procedure: SEPTOPLASTY, NOSE, WITH NASAL TURBINATE REDUCTION;  Surgeon: Karis Clunes, MD;  Location: [facility];  Service: ENT;  Laterality: Bilateral;    NASAL SINUS SURGERY     shoulder arthroscopy Left 2018   SHOULDER ARTHROSCOPY Right 08/26/2020   SHOULDER ARTHROSCOPY WITH DEBRIDEMENT AND BICEP TENDON REPAIR Right 10/18/2021   Procedure: RIGHT SHOULDER ARTHROSCOPY, DEBRIDEMENT, BICEPS TENODESIS;  Surgeon: Addie Cordella Hamilton, MD;  Location: [facility];  Service: Orthopedics;  Laterality: Right;    Home Medications:  Allergies as of 05/29/2024       Reactions   Pravastatin    Other Reaction(s): leg pain   Simvastatin    Other Reaction(s): chest pain   Pitavastatin Palpitations   Tamsulosin  Itching        Medication List        Accurate as of May 29, 2024  2:17 PM. If you have any questions, ask your nurse or doctor.          B-D 3CC LUER-LOK SYR 23GX1 23G X 1 3 ML Misc Generic drug: SYRINGE-NEEDLE (DISP) 3 ML once a week.   propranolol 10 MG tablet Commonly known as: INDERAL Take 10 mg by mouth as needed (anxiety). PRN anxiety   rosuvastatin  10 MG tablet Commonly known as: CRESTOR  TAKE 1 TABLET BY MOUTH AT 1PM AND 1 TAB AT DINNER.   sildenafil  20 MG tablet Commonly known as: Revatio  Take 1 to 5 tablets as needed 1 hour prior to intercourse   tadalafil  10 MG tablet Commonly known as: Cialis  Take 1 tablet (10 mg total) by mouth daily as needed for erectile dysfunction. What changed:  medication  strength how much to take when to take this reasons to take this Changed by: Jahaziel Morganti   tamsulosin  0.4 MG Caps capsule Commonly known as: FLOMAX  Take 1 capsule (0.4 mg total) by mouth daily.   testosterone  cypionate 200 MG/ML injection Commonly known as: DEPOTESTOSTERONE CYPIONATE Inject 100 mg into the muscle every 7 (seven) days.   valACYclovir 500 MG tablet Commonly known as: VALTREX Take 500 mg by mouth 2 (two) times daily.        Allergies:  Allergies  Allergen Reactions   Pravastatin     Other Reaction(s): leg pain   Simvastatin     Other Reaction(s): chest pain   Pitavastatin  Palpitations   Tamsulosin  Itching    Family History: Family History  Adopted: Yes  Problem Relation Age of Onset   Heart attack Paternal Grandfather     Social History: See HPI for pertinent social history  ROS: Pertinent ROS in HPI  Physical Exam: BP 138/86 (BP Location: Left Arm, Patient Position: Sitting, Cuff Size: Large)   Pulse 83   Ht 5' 10 (1.778 m)   Wt 230 lb (104.3 kg)   BMI 33.00 kg/m   Constitutional:  Well nourished. Alert and oriented, No acute distress. HEENT: [place] AT, moist mucus membranes.  Trachea midline Cardiovascular: No clubbing, cyanosis, or edema. Respiratory: Normal respiratory effort, no increased work of breathing. Neurologic: Grossly intact, no focal deficits, moving all 4 extremities. Psychiatric: Normal mood and affect.   Laboratory Data: See EPIC and HPI  I have reviewed the labs.   Pertinent Imaging: N/A  Assessment & Plan:    1. Benign prostatic hyperplasia with urinary frequency (Primary) - stable symptoms  - PSA up to date  - most bothersome symptom is urinary urgency  - encouraged avoiding bladder irritants, fluid restriction before bedtime and timed voiding's - Encouraged him not to drink so much water just prior to exercise and also reduce his water intake during the day - will have him back in three months to see how he responds to the medication   2. Hypogonadism - continue testosterone  cypionate 200 mg/mL, 0.5 cc every 7 days   3. ED - We will go ahead and have him take tadalafil  10 mg daily and then he will augment with sildenafil  20 mg on demand dosing for erections - will have him back in three months to see how his responds to the treatment regimine   Return in about 3 months (around 08/29/2024) for symptom recheck .  These notes generated with voice recognition software. I apologize for typographical errors.  [name]  [facility] [address]  [address] [place], [place] [address] [phone]

## 2024-05-29 ENCOUNTER — Encounter: Payer: Self-pay | Admitting: Urology

## 2024-05-29 ENCOUNTER — Ambulatory Visit: Admitting: Urology

## 2024-05-29 VITALS — BP 138/86 | HR 83 | Ht 70.0 in | Wt 230.0 lb

## 2024-05-29 DIAGNOSIS — E291 Testicular hypofunction: Secondary | ICD-10-CM

## 2024-05-29 DIAGNOSIS — N529 Male erectile dysfunction, unspecified: Secondary | ICD-10-CM

## 2024-05-29 DIAGNOSIS — N401 Enlarged prostate with lower urinary tract symptoms: Secondary | ICD-10-CM | POA: Diagnosis not present

## 2024-05-29 DIAGNOSIS — R350 Frequency of micturition: Secondary | ICD-10-CM

## 2024-05-29 MED ORDER — SILDENAFIL CITRATE 20 MG PO TABS: ORAL_TABLET | ORAL | 3 refills | Status: DC

## 2024-05-29 MED ORDER — TADALAFIL 10 MG PO TABS: 10.0000 mg | ORAL_TABLET | Freq: Every day | ORAL | 3 refills | Status: AC | PRN

## 2024-05-29 MED ORDER — TADALAFIL 10 MG PO TABS: 10.0000 mg | ORAL_TABLET | Freq: Every day | ORAL | 3 refills | Status: DC | PRN

## 2024-05-29 MED ORDER — TAMSULOSIN HCL 0.4 MG PO CAPS: 0.4000 mg | ORAL_CAPSULE | Freq: Every day | ORAL | 3 refills | Status: DC

## 2024-05-30 ENCOUNTER — Ambulatory Visit: Admitting: Physical Medicine and Rehabilitation

## 2024-06-03 ENCOUNTER — Encounter: Payer: Self-pay | Admitting: Physical Medicine and Rehabilitation

## 2024-06-03 ENCOUNTER — Ambulatory Visit: Admitting: Physical Medicine and Rehabilitation

## 2024-06-03 DIAGNOSIS — M545 Low back pain, unspecified: Secondary | ICD-10-CM | POA: Diagnosis not present

## 2024-06-03 DIAGNOSIS — M47817 Spondylosis without myelopathy or radiculopathy, lumbosacral region: Secondary | ICD-10-CM | POA: Diagnosis not present

## 2024-06-03 DIAGNOSIS — M47816 Spondylosis without myelopathy or radiculopathy, lumbar region: Secondary | ICD-10-CM

## 2024-06-03 DIAGNOSIS — G8929 Other chronic pain: Secondary | ICD-10-CM

## 2024-06-03 NOTE — Progress Notes (Signed)
 Jim Graham - 68 y.o. male MRN [id number]  Date of birth: October 31, 1955  Office Visit Note: Visit Date: 06/03/2024 PCP: Loreli Kins, MD Referred by: Loreli Kins, MD  Subjective: No chief complaint on file.  HPI: Jim Graham is a 68 y.o. male who comes in today for evaluation of chronic, worsening and severe left sided lower back pain. Pain for several years, worsened about 2 months ago. His pain is severe with activity and movement. Lifting weights, bending and performing household tasks such as mowing causes increased pain. He describes pain as sore and aching sensation, currently rates as 2 out of 10. Patient is fairly active, he runs and lifts weights daily. Lumbar MRI imaging from 2019 shows facet arthropathy and moderate to advanced degenerative spinal stenosis at L4-L5. There is also biforaminal stenosis at L5-S1. History of left S1 transforaminal epidural steroid injections in our office in 2017 and 2019. Good relief of pain with these injections. He is here today to discuss options. Patient denies focal weakness, numbness and tingling. No recent trauma or falls.      Review of Systems  Musculoskeletal:  Positive for back pain.  Neurological:  Negative for tingling, sensory change, focal weakness and weakness.  All other systems reviewed and are negative.  Otherwise per HPI.  Assessment & Plan: Visit Diagnoses:    ICD-10-CM   1. Chronic left-sided low back pain without sciatica  M54.50 MR LUMBAR SPINE WO CONTRAST   G89.29     2. Spondylosis without myelopathy or radiculopathy, lumbosacral region  M47.817 MR LUMBAR SPINE WO CONTRAST    3. Facet arthropathy, lumbar  M47.816 MR LUMBAR SPINE WO CONTRAST       Plan: Findings:  Chronic, worsening and severe left sided lower back pain. Patient continues to have severe pain despite good conservative therapies such as home exercise regimen, rest and use of medications. Patients clinical presentation and exam are  complex, his pain does seem to be more facet mediated today. Could also be central stenosis at L4-L5, however no radiating pain down the legs at this time. We discussed treatment plan in detail today. Next step is to place order for new lumbar MRI imaging. Depending on results of imaging we would consider performing facet joint injection vs epidural steroid injection. Patient has no questions today, we will see him back for lumbar MRI review. No red flag symptoms noted upon exam today.     Meds & Orders: No orders of the defined types were placed in this encounter.   Orders Placed This Encounter  Procedures   MR LUMBAR SPINE WO CONTRAST    Follow-up: Return for Lumbar MRI review.   Procedures: No procedures performed      Clinical History: CLINICAL DATA:  Chronic mechanical back pain   EXAM: MRI LUMBAR SPINE WITHOUT CONTRAST   TECHNIQUE: Multiplanar, multisequence MR imaging of the lumbar spine was performed. No intravenous contrast was administered.   COMPARISON:  04/17/2012   FINDINGS: Segmentation: 5 lumbar type vertebral bodies based on the lowest ribs   Alignment:  Physiologic.   Vertebrae: Edematous and fatty signal on both sides of the collapsed L5-S1 disc space, chronic but progressive from 2013 and considered degenerative. No fracture or aggressive bone lesion   Conus medullaris and cauda equina: Conus extends to the L1 level. Conus and cauda equina appear normal.   Paraspinal and other soft tissues: Negative   Disc levels:   L2-L3: Minor disc narrowing and bulging   L3-L4: Mild  disc narrowing and bulging with small endplate spurs.   L4-L5: Disc narrowing and bulging. Posterior element hypertrophy. Moderate to advanced spinal stenosis. Both foramina are patent   L5-S1:Severe disc degeneration with collapse, bulge, and endplate ridging. Biforaminal impingement, particularly advanced on the left   IMPRESSION: 1. Disc degeneration that is focally advanced  at L5-S1 where disc collapse and ridging causes biforaminal impingement, worse on the left. 2. L4-5 moderate to advanced degenerative spinal stenosis.     Electronically Signed   By: Cassondra Roulette M.D.   On: 08/13/2018 08:59   He reports that he has never smoked. He has never used smokeless tobacco. No results for input(s): HGBA1C, LABURIC in the last 8760 hours.  Objective:  VS:  HT:    WT:   BMI:     BP:   HR: bpm  TEMP: ( )  RESP:  Physical Exam Vitals and nursing note reviewed.  HENT:     Head: Normocephalic and atraumatic.     Right Ear: External ear normal.     Left Ear: External ear normal.     Nose: Nose normal.     Mouth/Throat:     Mouth: Mucous membranes are moist.  Eyes:     Extraocular Movements: Extraocular movements intact.  Cardiovascular:     Rate and Rhythm: Normal rate.     Pulses: Normal pulses.  Pulmonary:     Effort: Pulmonary effort is normal.  Abdominal:     General: Abdomen is flat. There is no distension.  Musculoskeletal:        General: Tenderness present.     Cervical back: Normal range of motion.     Comments: Patient rises from seated position to standing without difficulty. Pain noted with facet loading and lumbar extension. 5/5 strength noted with bilateral hip flexion, knee flexion/extension, ankle dorsiflexion/plantarflexion and EHL. No clonus noted bilaterally. No pain upon palpation of greater trochanters. No pain with internal/external rotation of bilateral hips. Sensation intact bilaterally. Negative slump test bilaterally. Ambulates without aid, gait steady.     Skin:    General: Skin is warm and dry.     Capillary Refill: Capillary refill takes less than 2 seconds.  Neurological:     General: No focal deficit present.     Mental Status: He is oriented to person, place, and time.  Psychiatric:        Mood and Affect: Mood normal.        Behavior: Behavior normal.     Ortho Exam  Imaging: No results found.  Past  Medical/Family/Surgical/Social History: Medications & Allergies reviewed per EMR, new medications updated. Patient Active Problem List   Diagnosis Date Noted   Chronic rhinitis 12/07/2023   Deviated nasal septum 12/07/2023   Hypertrophy of nasal turbinates 12/07/2023   Other specified disorders of eustachian tube, left ear 12/07/2023   Complete tear of right rotator cuff    Degenerative superior labral anterior-to-posterior (SLAP) tear of right shoulder    Shoulder arthritis    Chronic right shoulder pain 08/31/2021   History of arthroscopy of left shoulder 12/21/2016   GERD (gastroesophageal reflux disease) 05/25/2015   Agatston coronary artery calcium  score less than 100 02/08/2015   ED (erectile dysfunction) 02/08/2015   Pulmonary nodules 02/08/2015   Dilated aortic root (HCC) 02/08/2015   Chest pain 08/24/2011   Hyperlipidemia 08/24/2011   Elevated BP 08/24/2011   History of palpitations 12/16/2010   Past Medical History:  Diagnosis Date   GERD (gastroesophageal reflux disease)  Hyperlipidemia    Palpitations    Family History  Adopted: Yes  Problem Relation Age of Onset   Heart attack Paternal Grandfather    Past Surgical History:  Procedure Laterality Date   bicep re-attachment     NASAL SEPTOPLASTY W/ TURBINOPLASTY Bilateral 12/31/2023   Procedure: SEPTOPLASTY, NOSE, WITH NASAL TURBINATE REDUCTION;  Surgeon: Karis Clunes, MD;  Location: [facility];  Service: ENT;  Laterality: Bilateral;   NASAL SINUS SURGERY     shoulder arthroscopy Left 2018   SHOULDER ARTHROSCOPY Right 08/26/2020   SHOULDER ARTHROSCOPY WITH DEBRIDEMENT AND BICEP TENDON REPAIR Right 10/18/2021   Procedure: RIGHT SHOULDER ARTHROSCOPY, DEBRIDEMENT, BICEPS TENODESIS;  Surgeon: Addie Cordella Hamilton, MD;  Location: [facility];  Service: Orthopedics;  Laterality: Right;   Social History   Occupational History   Occupation: Fireman    CommentLexicographer  Tobacco Use   Smoking status: Never    Smokeless tobacco: Never  Vaping Use   Vaping status: Never Used  Substance and Sexual Activity   Alcohol use: No   Drug use: No   Sexual activity: Not on file

## 2024-06-03 NOTE — Progress Notes (Signed)
 Lower back pain  3/10 No radiation  Last injection 07/24/18 Wanting another injection  Takes Celebrex 

## 2024-06-05 DIAGNOSIS — J301 Allergic rhinitis due to pollen: Secondary | ICD-10-CM | POA: Diagnosis not present

## 2024-06-05 DIAGNOSIS — J3089 Other allergic rhinitis: Secondary | ICD-10-CM | POA: Diagnosis not present

## 2024-06-06 ENCOUNTER — Encounter: Payer: Self-pay | Admitting: Physical Medicine and Rehabilitation

## 2024-06-09 DIAGNOSIS — E785 Hyperlipidemia, unspecified: Secondary | ICD-10-CM | POA: Diagnosis not present

## 2024-06-09 DIAGNOSIS — Z6833 Body mass index (BMI) 33.0-33.9, adult: Secondary | ICD-10-CM | POA: Diagnosis not present

## 2024-06-09 DIAGNOSIS — E291 Testicular hypofunction: Secondary | ICD-10-CM | POA: Diagnosis not present

## 2024-06-09 DIAGNOSIS — R7303 Prediabetes: Secondary | ICD-10-CM | POA: Diagnosis not present

## 2024-06-15 ENCOUNTER — Ambulatory Visit
Admission: RE | Admit: 2024-06-15 | Discharge: 2024-06-15 | Disposition: A | Discharge status: Home / Self Care | Source: Ambulatory Visit | Attending: Physical Medicine and Rehabilitation | Admitting: Physical Medicine and Rehabilitation

## 2024-06-15 DIAGNOSIS — G8929 Other chronic pain: Secondary | ICD-10-CM

## 2024-06-15 DIAGNOSIS — M47817 Spondylosis without myelopathy or radiculopathy, lumbosacral region: Secondary | ICD-10-CM

## 2024-06-15 DIAGNOSIS — M47816 Spondylosis without myelopathy or radiculopathy, lumbar region: Secondary | ICD-10-CM

## 2024-06-20 NOTE — Telephone Encounter (Signed)
 Mistake.

## 2024-06-30 ENCOUNTER — Telehealth: Payer: Self-pay | Admitting: Radiology

## 2024-06-30 ENCOUNTER — Ambulatory Visit: Admitting: Physical Medicine and Rehabilitation

## 2024-06-30 ENCOUNTER — Encounter: Payer: Self-pay | Admitting: Physical Medicine and Rehabilitation

## 2024-06-30 DIAGNOSIS — M5416 Radiculopathy, lumbar region: Secondary | ICD-10-CM | POA: Diagnosis not present

## 2024-06-30 DIAGNOSIS — G8929 Other chronic pain: Secondary | ICD-10-CM | POA: Diagnosis not present

## 2024-06-30 DIAGNOSIS — M5442 Lumbago with sciatica, left side: Secondary | ICD-10-CM | POA: Diagnosis not present

## 2024-06-30 MED ORDER — DIAZEPAM 5 MG PO TABS: ORAL_TABLET | ORAL | 0 refills | Status: DC

## 2024-06-30 NOTE — Progress Notes (Unsigned)
 Pain Scale   Average Pain 2 Patient advising he has lower back pain and is here for MRI review        +Driver, -BT, -Dye Allergies.

## 2024-06-30 NOTE — Telephone Encounter (Signed)
 Submitted for PA on Valium  for procedure for Duwaine Pouch.

## 2024-06-30 NOTE — Progress Notes (Unsigned)
 Jim Graham - 68 y.o. male MRN [id number]  Date of birth: Oct 24, 1955  Office Visit Note: Visit Date: 06/30/2024 PCP: Loreli Kins, MD Referred by: Loreli Kins, MD  Subjective: Chief Complaint  Patient presents with   Lower Back - Pain   HPI: Jim Graham is a 68 y.o. male who comes in today for evaluation of chronic, worsening and severe left sided lower back pain, some referral to left hip region. Pain ongoing for several years, worsens with activity and movement. Patient is well known to us . He is here today for lumbar MRI review. He is fairly active, continues to exercise and lift weights multiple times a week. He describes his pain as sore and aching sensation, currently rates as 3 out of 10. Some relief of pain with home exercise regimen, rest and use of medications. Recent lumbar MRI imaging shows moderate multifactorial stenosis at L4-L5.  History of left S1 transforaminal epidural steroid injections in our office in 2017 and 2019. Good relief of pain with these injections. Patient denies focal weakness, numbness and tingling. No recent trauma or falls,      Review of Systems  Musculoskeletal:  Positive for back pain.  Neurological:  Negative for tingling, sensory change, focal weakness and weakness.  All other systems reviewed and are negative.  Otherwise per HPI.  Assessment & Plan: Visit Diagnoses:    ICD-10-CM   1. Chronic left-sided low back pain with left-sided sciatica  M54.42 Ambulatory referral to Physical Medicine Rehab   G89.29 Ambulatory referral to Orthopedic Surgery    2. Lumbar radiculopathy  M54.16 Ambulatory referral to Physical Medicine Rehab    Ambulatory referral to Orthopedic Surgery       Plan: Findings:  Chronic, worsening and severe left sided lower back pain, some referral to left hip region. Patient continues to have severe pain despite good conservative therapies such as home exercise regimen, rest and use of medications. Patients  clinical presentation and exam are consistent with lumbar radiculopathy. There is moderate multifactorial central canal stenosis at L4-L5. We discussed treatment plan in detail today. Next step is to perform diagnostic and hopefully therapeutic left L4-L5 interlaminar epidural steroid injection under fluoroscopic guidance. I also placed referral to our spine surgeon Dr. Ozell Ada to discuss surgical options. He can continue with Celebrex  once daily, I advised him that increasing to twice daily can lead to cardiac issues long term. He has no questions at this time. I did prescribe pre-procedure Valium  for him to take on day of injection. No red flag symptoms noted upon exam today.     Meds & Orders:  Meds ordered this encounter  Medications   diazepam  (VALIUM ) 5 MG tablet    Sig: Take one tablet by mouth with light food one hour prior to procedure.    Dispense:  1 tablet    Refill:  0    Orders Placed This Encounter  Procedures   Ambulatory referral to Physical Medicine Rehab   Ambulatory referral to Orthopedic Surgery    Follow-up: Return for Left L4-L5 interlaminar epidural steroid injection.   Procedures: No procedures performed      Clinical History: MRI LUMBAR SPINE WITHOUT CONTRAST   TECHNIQUE: Multiplanar, multisequence MR imaging of the lumbar spine was performed. No intravenous contrast was administered.   COMPARISON:  MRI 08/12/2018   FINDINGS: Segmentation:  5 lumbar type vertebral bodies.   Alignment:  No malalignment.   Vertebrae: No fracture or focal bone lesion. Resolution of previously seen  degenerative edema of the L5 and S1 vertebral bodies, present on the 2019 study. Marrow changes are now chronic without active edema.   Conus medullaris and cauda equina: Conus extends to the L1 level. Conus and cauda equina appear normal.   Paraspinal and other soft tissues: Negative   Disc levels:   Minimal non-compressive disc bulges from T11-12 through L2-3.  No compressive stenosis.   L3-4: Mild bulging of the disc. Facet and ligamentous hypertrophy. Mild stenosis of both lateral recesses but without visible neural compression.   L4-5: Chronic disc degeneration with loss of disc height. Endplate osteophytes and bulging of the disc. Facet and ligamentous hypertrophy, worse on the left. Moderate multifactorial stenosis. Some potential for neural compression in the lateral recesses, with stenosis slightly worse on the left than the right.   L5-S1: Chronic disc degeneration with loss of disc height. Endplate osteophytes and bulging of the disc more prominent towards the left. Facet degeneration and hypertrophy. Stenosis of the lateral recesses and neural foramina, left more than right. Some potential for neural compression at this level, particularly of the exiting left L5 nerve. This stenosis has worsened slightly since 2019.   IMPRESSION: 1. L3-4: Disc bulge. Facet and ligamentous hypertrophy. Mild stenosis of both lateral recesses but without visible neural compression. 2. L4-5: Chronic disc degeneration with loss of disc height. Endplate osteophytes and bulging of the disc. Facet and ligamentous hypertrophy, worse on the left. Moderate multifactorial stenosis. Some potential for neural compression in the lateral recesses, with stenosis slightly worse on the left than the right. 3. L5-S1: Chronic disc degeneration with loss of disc height. Endplate osteophytes and bulging of the disc more prominent towards the left. Facet degeneration and hypertrophy. Stenosis of the lateral recesses and neural foramina, left more than right. Some potential for neural compression at this level, particularly of the exiting left L5 nerve. This stenosis has worsened slightly since 2019.     Electronically Signed   By: Oneil Officer M.D.   On: 06/25/2024 15:29   He reports that he has never smoked. He has never used smokeless tobacco. No results for  input(s): HGBA1C, LABURIC in the last 8760 hours.  Objective:  VS:  HT:    WT:   BMI:     BP:   HR: bpm  TEMP: ( )  RESP:  Physical Exam Vitals and nursing note reviewed.  HENT:     Head: Normocephalic and atraumatic.     Right Ear: External ear normal.     Left Ear: External ear normal.     Nose: Nose normal.     Mouth/Throat:     Mouth: Mucous membranes are moist.  Eyes:     Extraocular Movements: Extraocular movements intact.  Cardiovascular:     Rate and Rhythm: Normal rate.     Pulses: Normal pulses.  Pulmonary:     Effort: Pulmonary effort is normal.  Abdominal:     General: Abdomen is flat. There is no distension.  Musculoskeletal:        General: Tenderness present.     Cervical back: Normal range of motion.     Comments: Patient rises from seated position to standing without difficulty. Good lumbar range of motion. No pain noted with facet loading. 5/5 strength noted with bilateral hip flexion, knee flexion/extension, ankle dorsiflexion/plantarflexion and EHL. No clonus noted bilaterally. No pain upon palpation of greater trochanters. No pain with internal/external rotation of bilateral hips. Sensation intact bilaterally. Negative slump test bilaterally. Ambulates without  aid, gait steady.     Skin:    General: Skin is warm and dry.     Capillary Refill: Capillary refill takes less than 2 seconds.  Neurological:     General: No focal deficit present.     Mental Status: He is alert and oriented to person, place, and time.  Psychiatric:        Mood and Affect: Mood normal.        Behavior: Behavior normal.     Ortho Exam  Imaging: No results found.  Past Medical/Family/Surgical/Social History: Medications & Allergies reviewed per EMR, new medications updated. Patient Active Problem List   Diagnosis Date Noted   Chronic rhinitis 12/07/2023   Deviated nasal septum 12/07/2023   Hypertrophy of nasal turbinates 12/07/2023   Other specified disorders of  eustachian tube, left ear 12/07/2023   Complete tear of right rotator cuff    Degenerative superior labral anterior-to-posterior (SLAP) tear of right shoulder    Shoulder arthritis    Chronic right shoulder pain 08/31/2021   History of arthroscopy of left shoulder 12/21/2016   GERD (gastroesophageal reflux disease) 05/25/2015   Agatston coronary artery calcium  score less than 100 02/08/2015   ED (erectile dysfunction) 02/08/2015   Pulmonary nodules 02/08/2015   Dilated aortic root (HCC) 02/08/2015   Chest pain 08/24/2011   Hyperlipidemia 08/24/2011   Elevated BP 08/24/2011   History of palpitations 12/16/2010   Past Medical History:  Diagnosis Date   GERD (gastroesophageal reflux disease)    Hyperlipidemia    Palpitations    Family History  Adopted: Yes  Problem Relation Age of Onset   Heart attack Paternal Grandfather    Past Surgical History:  Procedure Laterality Date   bicep re-attachment     NASAL SEPTOPLASTY W/ TURBINOPLASTY Bilateral 12/31/2023   Procedure: SEPTOPLASTY, NOSE, WITH NASAL TURBINATE REDUCTION;  Surgeon: Karis Clunes, MD;  Location: [facility];  Service: ENT;  Laterality: Bilateral;   NASAL SINUS SURGERY     shoulder arthroscopy Left 2018   SHOULDER ARTHROSCOPY Right 08/26/2020   SHOULDER ARTHROSCOPY WITH DEBRIDEMENT AND BICEP TENDON REPAIR Right 10/18/2021   Procedure: RIGHT SHOULDER ARTHROSCOPY, DEBRIDEMENT, BICEPS TENODESIS;  Surgeon: Addie Cordella Hamilton, MD;  Location: [facility];  Service: Orthopedics;  Laterality: Right;   Social History   Occupational History   Occupation: Fireman    CommentLexicographer  Tobacco Use   Smoking status: Never   Smokeless tobacco: Never  Vaping Use   Vaping status: Never Used  Substance and Sexual Activity   Alcohol use: No   Drug use: No   Sexual activity: Not on file

## 2024-07-01 ENCOUNTER — Telehealth: Payer: Self-pay | Admitting: Physical Medicine and Rehabilitation

## 2024-07-01 NOTE — Telephone Encounter (Signed)
 Amber from [employer] called and prior authorization for the Diazepam  is approved and effective 06/30/2024-06/30/2025  CB#[phone]  Option 5

## 2024-07-01 NOTE — Telephone Encounter (Signed)
 Amber from [employer] called and prior authorization for the Diazepam  is approved and effective 06/30/2024-06/30/2025  CB#[phone]  Option 5

## 2024-07-08 ENCOUNTER — Telehealth: Payer: Self-pay | Admitting: Physical Medicine and Rehabilitation

## 2024-07-08 NOTE — Telephone Encounter (Signed)
 Patient called and said he needs to reschedule for the 20th or the 21st of October. CB#[phone]

## 2024-07-28 ENCOUNTER — Encounter: Admitting: Physical Medicine and Rehabilitation

## 2024-07-30 ENCOUNTER — Ambulatory Visit (INDEPENDENT_AMBULATORY_CARE_PROVIDER_SITE_OTHER): Admitting: Otolaryngology

## 2024-07-30 ENCOUNTER — Encounter (INDEPENDENT_AMBULATORY_CARE_PROVIDER_SITE_OTHER): Payer: Self-pay | Admitting: Otolaryngology

## 2024-07-30 VITALS — BP 124/85 | HR 94 | Temp 97.5°F | Ht 70.0 in | Wt 229.0 lb

## 2024-07-30 DIAGNOSIS — J343 Hypertrophy of nasal turbinates: Secondary | ICD-10-CM

## 2024-07-30 DIAGNOSIS — J310 Chronic rhinitis: Secondary | ICD-10-CM

## 2024-07-30 MED ORDER — FLUTICASONE PROPIONATE 50 MCG/ACT NA SUSP: 2.0000 | Freq: Every day | NASAL | 10 refills | Status: AC

## 2024-07-30 NOTE — Progress Notes (Signed)
 Patient ID: Jim Graham, male   DOB: February 11, 1956, 68 y.o.   MRN: [id number]  Follow-up: Chronic nasal obstruction  HPI: The patient is a 68 year old male who returns today for his follow-up evaluation.  The patient has a history of chronic nasal obstruction.  He underwent septoplasty and turbinate reduction surgery in March 2025.  The patient returns today reporting significant improvement in his nasal breathing.  He has noted only mild nasal congestion during the fall allergy season.  He is currently using Navage nasal irrigation daily.  He denies any facial pain, fever, or visual change.  Exam: General: Communicates without difficulty, well nourished, no acute distress. Head: Normocephalic, no evidence injury, no tenderness, facial buttresses intact without stepoff. Face/sinus: No tenderness to palpation and percussion. Facial movement is normal and symmetric. Eyes: PERRL, EOMI. No scleral icterus, conjunctivae clear. Neuro: CN II exam reveals vision grossly intact.  No nystagmus at any point of gaze. Ears: Auricles well formed without lesions.  Ear canals are intact without mass or lesion.  No erythema or edema is appreciated.  The TMs are intact without fluid. Nose: External evaluation reveals normal support and skin without lesions.  Dorsum is intact.  Anterior rhinoscopy reveals congested mucosa over anterior aspect of inferior turbinates and intact septum.  No purulence noted. Oral:  Oral cavity and oropharynx are intact, symmetric, without erythema or edema.  Mucosa is moist without lesions. Neck: Full range of motion without pain.  There is no significant lymphadenopathy.  No masses palpable.  Thyroid  bed within normal limits to palpation.  Parotid glands and submandibular glands equal bilaterally without mass.  Trachea is midline. Neuro:  CN 2-12 grossly intact.   Assessment: 1.  Chronic rhinitis with nasal mucosal congestion. 2.  His septum and turbinates are well-healed.  His nasal  passageways are patent bilaterally.  Plan: 1.  The physical exam findings are reviewed with the patient. 2.  Continue with Flonase nasal spray and nasal saline irrigation as needed. 3.  The patient will return for reevaluation in 6 months.

## 2024-08-04 ENCOUNTER — Ambulatory Visit: Admitting: Physical Medicine and Rehabilitation

## 2024-08-04 ENCOUNTER — Other Ambulatory Visit: Payer: Self-pay

## 2024-08-04 ENCOUNTER — Telehealth: Payer: Self-pay | Admitting: Physical Medicine and Rehabilitation

## 2024-08-04 DIAGNOSIS — M5416 Radiculopathy, lumbar region: Secondary | ICD-10-CM

## 2024-08-04 MED ORDER — METHYLPREDNISOLONE ACETATE 40 MG/ML IJ SUSP
40.0000 mg | Freq: Once | INTRAMUSCULAR | Status: AC
  Administered 2024-08-04: 40 mg

## 2024-08-04 NOTE — Progress Notes (Deleted)
 -  BT, -Dye Allergies.

## 2024-08-04 NOTE — Telephone Encounter (Signed)
 Patient called and said he would have to reschedule because he don't have a driver. CB#[phone]

## 2024-08-05 NOTE — Progress Notes (Signed)
 Jim Graham - 68 y.o. male MRN [id number]  Date of birth: October 07, 1956  Office Visit Note: Visit Date: 08/04/2024 PCP: Loreli Kins, MD Referred by: Loreli Kins, MD  Subjective: Chief Complaint  Patient presents with   Lower Back - Pain   HPI:  Jim Graham is a 68 y.o. male who comes in today at the request of Duwaine Pouch, FNP for planned Left L4-5 Lumbar Interlaminar epidural steroid injection with fluoroscopic guidance.  The patient has failed conservative care including home exercise, medications, time and activity modification.  This injection will be diagnostic and hopefully therapeutic.  Please see requesting physician notes for further details and justification.   ROS Otherwise per HPI.  Assessment & Plan: Visit Diagnoses:    ICD-10-CM   1. Lumbar radiculopathy  M54.16 XR C-ARM NO REPORT    Epidural Steroid injection    methylPREDNISolone  acetate (DEPO-MEDROL ) injection 40 mg      Plan: No additional findings.   Meds & Orders:  Meds ordered this encounter  Medications   methylPREDNISolone  acetate (DEPO-MEDROL ) injection 40 mg    Orders Placed This Encounter  Procedures   XR C-ARM NO REPORT   Epidural Steroid injection    Follow-up: Return for visit to requesting provider as needed.   Procedures: No procedures performed  Lumbar Epidural Steroid Injection - Interlaminar Approach with Fluoroscopic Guidance  Patient: Jim Graham      Date of Birth: 12/18/1955 MRN: [id number] PCP: Loreli Kins, MD      Visit Date: 08/04/2024   Universal Protocol:     Consent Given By: the patient  Position: PRONE  Additional Comments: Vital signs were monitored before and after the procedure. Patient was prepped and draped in the usual sterile fashion. The correct patient, procedure, and site was verified.   Injection Procedure Details:   Procedure diagnoses: Lumbar radiculopathy [M54.16]   Meds Administered:  Meds ordered this encounter   Medications   methylPREDNISolone  acetate (DEPO-MEDROL ) injection 40 mg     Laterality: Left  Location/Site:  L4-5  Needle: 3.5 in., 20 ga. Tuohy  Needle Placement: Paramedian epidural  Findings:   -Comments: Excellent flow of contrast into the epidural space.  Procedure Details: Using a paramedian approach from the side mentioned above, the region overlying the inferior lamina was localized under fluoroscopic visualization and the soft tissues overlying this structure were infiltrated with 4 ml. of 1% Lidocaine  without Epinephrine . The Tuohy needle was inserted into the epidural space using a paramedian approach.   The epidural space was localized using loss of resistance along with counter oblique bi-planar fluoroscopic views.  After negative aspirate for air, blood, and CSF, a 2 ml. volume of Isovue -250 was injected into the epidural space and the flow of contrast was observed. Radiographs were obtained for documentation purposes.    The injectate was administered into the level noted above.   Additional Comments:  The patient tolerated the procedure well Dressing: 2 x 2 sterile gauze and Band-Aid    Post-procedure details: Patient was observed during the procedure. Post-procedure instructions were reviewed.  Patient left the clinic in stable condition.   Clinical History: MRI LUMBAR SPINE WITHOUT CONTRAST   TECHNIQUE: Multiplanar, multisequence MR imaging of the lumbar spine was performed. No intravenous contrast was administered.   COMPARISON:  MRI 08/12/2018   FINDINGS: Segmentation:  5 lumbar type vertebral bodies.   Alignment:  No malalignment.   Vertebrae: No fracture or focal bone lesion. Resolution of previously seen degenerative edema of  the L5 and S1 vertebral bodies, present on the 2019 study. Marrow changes are now chronic without active edema.   Conus medullaris and cauda equina: Conus extends to the L1 level. Conus and cauda equina appear  normal.   Paraspinal and other soft tissues: Negative   Disc levels:   Minimal non-compressive disc bulges from T11-12 through L2-3. No compressive stenosis.   L3-4: Mild bulging of the disc. Facet and ligamentous hypertrophy. Mild stenosis of both lateral recesses but without visible neural compression.   L4-5: Chronic disc degeneration with loss of disc height. Endplate osteophytes and bulging of the disc. Facet and ligamentous hypertrophy, worse on the left. Moderate multifactorial stenosis. Some potential for neural compression in the lateral recesses, with stenosis slightly worse on the left than the right.   L5-S1: Chronic disc degeneration with loss of disc height. Endplate osteophytes and bulging of the disc more prominent towards the left. Facet degeneration and hypertrophy. Stenosis of the lateral recesses and neural foramina, left more than right. Some potential for neural compression at this level, particularly of the exiting left L5 nerve. This stenosis has worsened slightly since 2019.   IMPRESSION: 1. L3-4: Disc bulge. Facet and ligamentous hypertrophy. Mild stenosis of both lateral recesses but without visible neural compression. 2. L4-5: Chronic disc degeneration with loss of disc height. Endplate osteophytes and bulging of the disc. Facet and ligamentous hypertrophy, worse on the left. Moderate multifactorial stenosis. Some potential for neural compression in the lateral recesses, with stenosis slightly worse on the left than the right. 3. L5-S1: Chronic disc degeneration with loss of disc height. Endplate osteophytes and bulging of the disc more prominent towards the left. Facet degeneration and hypertrophy. Stenosis of the lateral recesses and neural foramina, left more than right. Some potential for neural compression at this level, particularly of the exiting left L5 nerve. This stenosis has worsened slightly since 2019.     Electronically Signed    By: Oneil Officer M.D.   On: 06/25/2024 15:29     Objective:  VS:  HT:    WT:   BMI:     BP:   HR: bpm  TEMP: ( )  RESP:  Physical Exam Vitals and nursing note reviewed.  Constitutional:      General: He is not in acute distress.    Appearance: Normal appearance. He is not ill-appearing.  HENT:     Head: Normocephalic and atraumatic.     Right Ear: External ear normal.     Left Ear: External ear normal.     Nose: No congestion.  Eyes:     Extraocular Movements: Extraocular movements intact.  Cardiovascular:     Rate and Rhythm: Normal rate.     Pulses: Normal pulses.  Pulmonary:     Effort: Pulmonary effort is normal. No respiratory distress.  Abdominal:     General: There is no distension.     Palpations: Abdomen is soft.  Musculoskeletal:        General: No tenderness or signs of injury.     Cervical back: Neck supple.     Right lower leg: No edema.     Left lower leg: No edema.     Comments: Patient has good distal strength without clonus.  Skin:    Findings: No erythema or rash.  Neurological:     General: No focal deficit present.     Mental Status: He is alert and oriented to person, place, and time.     Sensory:  No sensory deficit.     Motor: No weakness or abnormal muscle tone.     Coordination: Coordination normal.  Psychiatric:        Mood and Affect: Mood normal.        Behavior: Behavior normal.      Imaging: XR C-ARM NO REPORT Result Date: 08/04/2024 Please see Notes tab for imaging impression.

## 2024-08-05 NOTE — Procedures (Signed)
 Lumbar Epidural Steroid Injection - Interlaminar Approach with Fluoroscopic Guidance  Patient: Jim Graham      Date of Birth: 07/14/1956 MRN: [id number] PCP: Loreli Kins, MD      Visit Date: 08/04/2024   Universal Protocol:     Consent Given By: the patient  Position: PRONE  Additional Comments: Vital signs were monitored before and after the procedure. Patient was prepped and draped in the usual sterile fashion. The correct patient, procedure, and site was verified.   Injection Procedure Details:   Procedure diagnoses: Lumbar radiculopathy [M54.16]   Meds Administered:  Meds ordered this encounter  Medications   methylPREDNISolone  acetate (DEPO-MEDROL ) injection 40 mg     Laterality: Left  Location/Site:  L4-5  Needle: 3.5 in., 20 ga. Tuohy  Needle Placement: Paramedian epidural  Findings:   -Comments: Excellent flow of contrast into the epidural space.  Procedure Details: Using a paramedian approach from the side mentioned above, the region overlying the inferior lamina was localized under fluoroscopic visualization and the soft tissues overlying this structure were infiltrated with 4 ml. of 1% Lidocaine  without Epinephrine . The Tuohy needle was inserted into the epidural space using a paramedian approach.   The epidural space was localized using loss of resistance along with counter oblique bi-planar fluoroscopic views.  After negative aspirate for air, blood, and CSF, a 2 ml. volume of Isovue -250 was injected into the epidural space and the flow of contrast was observed. Radiographs were obtained for documentation purposes.    The injectate was administered into the level noted above.   Additional Comments:  The patient tolerated the procedure well Dressing: 2 x 2 sterile gauze and Band-Aid    Post-procedure details: Patient was observed during the procedure. Post-procedure instructions were reviewed.  Patient left the clinic in stable  condition.

## 2024-08-06 ENCOUNTER — Ambulatory Visit: Admitting: Orthopedic Surgery

## 2024-08-06 ENCOUNTER — Other Ambulatory Visit (INDEPENDENT_AMBULATORY_CARE_PROVIDER_SITE_OTHER): Payer: Self-pay

## 2024-08-06 VITALS — BP 136/93 | HR 87 | Ht 70.0 in | Wt 229.0 lb

## 2024-08-06 DIAGNOSIS — M545 Low back pain, unspecified: Secondary | ICD-10-CM

## 2024-08-06 NOTE — Progress Notes (Signed)
 Orthopedic Spine Surgery Office Note  Assessment: Patient is a 68 y.o. male with low back pain and left-sided radicular type pain into the posterolateral thigh.  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  I told him that he does have a significant degenerative disc at L5/S1 with facet arthropathy that can cause stiffness.  Doing a fusion type surgery with known help with stiffness, it would only make him stiffer.  I also told him it would not be predictable with relief of back pain -A decompressive surgery at L4/5 could help with more of the radiating leg pain that he has developed at length but since he is doing well with injections, would not plan that surgery at this time - Patient is going to follow-up in 6 months, x-rays at next visit: AP/lateral/flex/ex lumbar  Patient expressed understanding of the plan and all questions were answered to the patient's satisfaction.   ___________________________________________________________________________   History:  Patient is a 68 y.o. male who presents today for lumbar spine.  Patient has had several years of low back pain.  He feels it in the lower lumbar region.  He attributes it to his years of more physical work.  He has more recently developed pain going into the posterolateral aspect of the left thigh.  He has been doing injections with Dr. Eldonna for several years.  Usually gets about 80% relief and it can last several years.  He is very active and enjoys running and weightlifting.   Weakness: Denies Symptoms of imbalance: Denies Paresthesias and numbness: Denies Bowel or bladder incontinence: Denies Saddle anesthesia: Denies  Treatments tried: lumbar steroid  injection  Review of systems: Denies fevers and chills, night sweats, unexplained weight loss, history of cancer, pain that wakes them at night  Past medical history: HLD  Allergies: statins, flomax   Past surgical history:  Bicep tendon repair Bilateral shoulder arthroscopy  Social history: Denies use of nicotine product (smoking, vaping, patches, smokeless) Alcohol use: Denies Denies recreational drug use   Physical Exam:  BMI of 32.9  General: no acute distress, appears stated age Neurologic: alert, answering questions appropriately, following commands Respiratory: unlabored breathing on room air, symmetric chest rise Psychiatric: appropriate affect, normal cadence to speech   MSK (spine):  -Strength exam      Left  Right EHL    5/5  5/5 TA    5/5  5/5 GSC    5/5  5/5 Knee extension  5/5  5/5 Hip flexion   5/5  5/5  -Sensory exam    Sensation intact to light touch in L3-S1 nerve distributions of bilateral lower extremities   Imaging: XRs of the lumbar spine from 08/06/2024 were independently reviewed and interpreted, showing disc height loss with anterior osteophyte formation at L5/S1.  No other significant degenerative changes seen.  No evidence of instability on flexion/extension views.  No fracture or dislocation seen.  MRI of the lumbar spine from 06/15/2024 was independently reviewed and interpreted, showing DDD at L5/S1.  Bilateral foraminal stenosis at L5/S1.  Central stenosis at L4/5.   Patient name: Jim Graham Patient MRN: [id number] Date of visit: 08/06/24

## 2024-08-18 ENCOUNTER — Ambulatory Visit: Payer: Medicare Other | Admitting: Orthopedic Surgery

## 2024-08-18 ENCOUNTER — Encounter: Payer: Self-pay | Admitting: Radiology

## 2024-08-26 DIAGNOSIS — Z01818 Encounter for other preprocedural examination: Secondary | ICD-10-CM

## 2024-08-26 NOTE — Progress Notes (Signed)
 08/28/2024 8:48 PM   [name] [birth year] [id number]  Referring provider: Loreli Kins, MD [address] [place],  [place] [address]  Urological history: 1. Hypogonadism - testosterone  level pending - hemoglobin/hematocrit pending - stopped testosterone  cypionate 200 mg/mL, 0.5 cc every 7 days - testosterone  gel   2. ED - tadalafil  5 mg daily - sidenafil 100 mg, on-demand-dosing  3. BPH with LU TS  - tamsulosin  0.4 mg daily, stopped   Chief Complaint  Patient presents with   Follow-up   Benign Prostatic Hypertrophy   HPI: GERAL COKER is a 68 y.o. man who presents today for follow up.   Previous records reviewed.   He has recently switched to testosterone  gel.  He denies new complaints of low libido, erectile dysfunction, fatigue, or mood changes.  No complaints of gynecomastia, visual changes, or thromboembolic symptoms.  Energy level, libido and overall sense of wellbeing being reported as stable/ improved compared to prior visit.    Testosterone  level pending   Hemoglobin/hematocrit pending   I PSS 6/0  He reports sensation of incomplete bladder emptying, urinary frequency, urinary intermittency,  urinary urgency, a weak urinary stream, nocturia x 1, and post void dribbling.   Patient denies any modifying or aggravating factors.  Patient denies any recent UTI's, gross hematuria, dysuria or suprapubic/flank pain.  Patient denies any fevers, chills, nausea or vomiting.    PSA  pending   BPH meds: Tamsulosin  0.4 mg daily  SHIM 11  He does not have confidence that he could get and keep an erection, his erections are not firm enough for penetrative intercourse, he has difficulty maintaining his erections,  and he is not finding intercourse satisfactory for him.  Patient still having spontaneous erections.  He denies any pain or curvature with erections.  He is not having issues with ejaculation.    Testosterone  level  pending  PMH: Past Medical History:  Diagnosis Date   GERD (gastroesophageal reflux disease)    Hyperlipidemia    Palpitations     Surgical History: Past Surgical History:  Procedure Laterality Date   bicep re-attachment     NASAL SEPTOPLASTY W/ TURBINOPLASTY Bilateral 12/31/2023   Procedure: SEPTOPLASTY, NOSE, WITH NASAL TURBINATE REDUCTION;  Surgeon: Karis Clunes, MD;  Location: [facility];  Service: ENT;  Laterality: Bilateral;   NASAL SINUS SURGERY     shoulder arthroscopy Left 2018   SHOULDER ARTHROSCOPY Right 08/26/2020   SHOULDER ARTHROSCOPY WITH DEBRIDEMENT AND BICEP TENDON REPAIR Right 10/18/2021   Procedure: RIGHT SHOULDER ARTHROSCOPY, DEBRIDEMENT, BICEPS TENODESIS;  Surgeon: Addie Cordella Hamilton, MD;  Location: [facility] OR;  Service: Orthopedics;  Laterality: Right;    Home Medications:  Allergies as of 08/28/2024       Reactions   Pravastatin    Other Reaction(s): leg pain   Simvastatin    Other Reaction(s): chest pain   Pitavastatin Palpitations   Tamsulosin  Itching        Medication List        Accurate as of August 28, 2024 11:59 PM. If you have any questions, ask your nurse or doctor.          STOP taking these medications    tamsulosin  0.4 MG Caps capsule Commonly known as: FLOMAX  Stopped by: Rim Thatch       TAKE these medications    B-D 3CC LUER-LOK SYR 23GX1 23G X 1 3 ML Misc Generic drug: SYRINGE-NEEDLE (DISP) 3 ML once a week.  celecoxib  200 MG capsule Commonly known as: CELEBREX  Take by mouth.   diazepam  5 MG tablet Commonly known as: VALIUM  Take one tablet by mouth with light food one hour prior to procedure.   fluticasone 50 MCG/ACT nasal spray Commonly known as: FLONASE Place 2 sprays into both nostrils daily.   propranolol 10 MG tablet Commonly known as: INDERAL Take 10 mg by mouth as needed (anxiety). PRN anxiety   rosuvastatin  10 MG tablet Commonly known as: CRESTOR  TAKE 1 TABLET BY MOUTH AT 1PM  AND 1 TAB AT DINNER.   sildenafil  20 MG tablet Commonly known as: Revatio  Take 1 to 5 tablets as needed 1 hour prior to intercourse   tadalafil  10 MG tablet Commonly known as: Cialis  Take 1 tablet (10 mg total) by mouth daily as needed for erectile dysfunction.   testosterone  cypionate 200 MG/ML injection Commonly known as: DEPOTESTOSTERONE CYPIONATE Inject 100 mg into the muscle every 7 (seven) days.   valACYclovir 500 MG tablet Commonly known as: VALTREX Take 500 mg by mouth 2 (two) times daily.        Allergies:  Allergies  Allergen Reactions   Pravastatin     Other Reaction(s): leg pain   Simvastatin     Other Reaction(s): chest pain   Pitavastatin Palpitations   Tamsulosin  Itching    Family History: Family History  Adopted: Yes  Problem Relation Age of Onset   Heart attack Paternal Grandfather     Social History: See HPI for pertinent social history  ROS: Pertinent ROS in HPI  Physical Exam: BP (!) 150/94   Pulse 100   Ht 5' 10 (1.778 m)   Wt 229 lb (103.9 kg)   BMI 32.86 kg/m   Constitutional:  Well nourished. Alert and oriented, No acute distress. HEENT: Esto AT, moist mucus membranes.  Trachea midline Cardiovascular: No clubbing, cyanosis, or edema. Respiratory: Normal respiratory effort, no increased work of breathing. Neurologic: Grossly intact, no focal deficits, moving all 4 extremities. Psychiatric: Normal mood and affect.   Laboratory Data: See EPIC and HPI  I have reviewed the labs.   Pertinent Imaging: N/A  Assessment & Plan:    1. BPH with LU TS - mild symptoms and he is delighted with his urinary symptoms  - PSA pending  - most bothersome symptoms are post void dribbling  - encouraged avoiding bladder irritants, fluid restriction before bedtime and timed voiding's - start Kegel exercises to strengthen pelvic muscles    2. Hypogonadism - continue testosterone  gel until labs are available and will adjust if necessary  3.  ED - tadalafil  10 mg daily - augment with sildenafil  20 mg, on-demand-dosing for intercourse  Return for Follow up pending labs.  These notes generated with voice recognition software. I apologize for typographical errors.  [name]  [facility] [address]  [address] [place], [place] [address] [phone]

## 2024-08-28 ENCOUNTER — Ambulatory Visit: Admitting: Urology

## 2024-08-28 ENCOUNTER — Other Ambulatory Visit: Payer: Self-pay

## 2024-08-28 VITALS — BP 150/94 | HR 100 | Ht 70.0 in | Wt 229.0 lb

## 2024-08-28 DIAGNOSIS — R350 Frequency of micturition: Secondary | ICD-10-CM

## 2024-08-28 DIAGNOSIS — N529 Male erectile dysfunction, unspecified: Secondary | ICD-10-CM | POA: Diagnosis not present

## 2024-08-28 DIAGNOSIS — E291 Testicular hypofunction: Secondary | ICD-10-CM | POA: Diagnosis not present

## 2024-08-28 DIAGNOSIS — N401 Enlarged prostate with lower urinary tract symptoms: Secondary | ICD-10-CM

## 2024-08-28 MED ORDER — SILDENAFIL CITRATE 20 MG PO TABS: ORAL_TABLET | ORAL | 3 refills | Status: AC

## 2024-08-28 NOTE — Patient Instructions (Addendum)
 You will need a serum testosterone , hemoglobin/hematocrit and PSA with your blood work.

## 2024-08-31 ENCOUNTER — Encounter: Payer: Self-pay | Admitting: Urology

## 2024-09-03 ENCOUNTER — Encounter: Admitting: Surgical

## 2024-09-04 ENCOUNTER — Telehealth: Payer: Self-pay

## 2024-09-04 NOTE — Telephone Encounter (Signed)
 Faxed lumber report

## 2024-09-04 NOTE — Telephone Encounter (Signed)
 Jim Graham with [name] would like x-ray report faxed to [phone].  Cb# [id number].  Please advise.  Thank you

## 2024-09-05 LAB — LAB REPORT - SCANNED
A1c: 5.6
EGFR: 76

## 2024-09-22 ENCOUNTER — Telehealth: Payer: Self-pay

## 2024-09-22 ENCOUNTER — Encounter: Payer: Self-pay | Admitting: Orthopedic Surgery

## 2024-09-22 ENCOUNTER — Ambulatory Visit: Admitting: Orthopedic Surgery

## 2024-09-22 DIAGNOSIS — M5416 Radiculopathy, lumbar region: Secondary | ICD-10-CM

## 2024-09-22 NOTE — Progress Notes (Signed)
 Office Visit Note   Patient: Jim Graham           Date of Birth: [birth year]           MRN: [id number] Visit Date: 09/22/2024 Requested by: Jim Kins, MD [address] [place],  [place] [address] PCP: Jim Kins, MD  Subjective: Chief Complaint  Patient presents with   Left Shoulder - Pain    HPI: Jim Graham is a 68 y.o. male who presents to the office reporting improvement in left shoulder pain.  Does have surgery scheduled for 09/30/2024 which was going to be arthroscopy with debridement and possible biceps tenodesis and evaluation of the rotator cuff.  MRI scan is reviewed.  Did have some moderate arthritis in the glenohumeral joint along with possible biceps tendon changes.  He is having worse sciatica lately affecting the left leg.  That pain does wake him at night but not in the shoulder.  He also reports some left leg pain which is radiating down the front of his leg.  Review of plain radiographs of the lumbar spine demonstrate possible mild to moderate arthritis in the left hip..                ROS: All systems reviewed are negative as they relate to the chief complaint within the history of present illness.  Patient denies fevers or chills.  Assessment & Plan: Visit Diagnoses:  1. Lumbar radiculopathy     Plan: Impression is left hip and leg pain which could be related to the back as left radiculopathy or potentially could be related to the hip.  We will refer him to Dr. Eldonna for further evaluation and management and he can inject whichever area he thinks would be more symptomatic for Jim Graham at this time.  Cancel shoulder surgery for now 39-month return for clinical recheck.  Follow-Up Instructions: Return in about 4 months (around 01/21/2025).   Orders:  Orders Placed This Encounter  Procedures   Ambulatory referral to Physical Medicine Rehab   No orders of the defined types were placed in this encounter.     Procedures: No procedures  performed   Clinical Data: No additional findings.  Objective: Vital Signs: There were no vitals taken for this visit.  Physical Exam:  Constitutional: Patient appears well-developed HEENT:  Head: Normocephalic Eyes:EOM are normal Neck: Normal range of motion Cardiovascular: Normal rate Pulmonary/chest: Effort normal Neurologic: Patient is alert Skin: Skin is warm Psychiatric: Patient has normal mood and affect  Ortho Exam: Ortho exam demonstrates slightly antalgic gait to the left.  Mild groin pain with internal/external Tatian of the leg on the left.  Shoulder range of motion is good with excellent rotator cuff strength.  Range of motion in that left shoulder 70/90/150.  Rotator cuff strength intact infraspinatus supraspinatus and subscap muscle testing.  No masses lymphadenopathy or skin changes noted in that shoulder girdle region with no tenderness to direct palpation of the [facility] joint.  Specialty Comments:  MRI LUMBAR SPINE WITHOUT CONTRAST   TECHNIQUE: Multiplanar, multisequence MR imaging of the lumbar spine was performed. No intravenous contrast was administered.   COMPARISON:  MRI 08/12/2018   FINDINGS: Segmentation:  5 lumbar type vertebral bodies.   Alignment:  No malalignment.   Vertebrae: No fracture or focal bone lesion. Resolution of previously seen degenerative edema of the L5 and S1 vertebral bodies, present on the 2019 study. Marrow changes are now chronic without active edema.   Conus medullaris  and cauda equina: Conus extends to the L1 level. Conus and cauda equina appear normal.   Paraspinal and other soft tissues: Negative   Disc levels:   Minimal non-compressive disc bulges from T11-12 through L2-3. No compressive stenosis.   L3-4: Mild bulging of the disc. Facet and ligamentous hypertrophy. Mild stenosis of both lateral recesses but without visible neural compression.   L4-5: Chronic disc degeneration with loss of disc height.  Endplate osteophytes and bulging of the disc. Facet and ligamentous hypertrophy, worse on the left. Moderate multifactorial stenosis. Some potential for neural compression in the lateral recesses, with stenosis slightly worse on the left than the right.   L5-S1: Chronic disc degeneration with loss of disc height. Endplate osteophytes and bulging of the disc more prominent towards the left. Facet degeneration and hypertrophy. Stenosis of the lateral recesses and neural foramina, left more than right. Some potential for neural compression at this level, particularly of the exiting left L5 nerve. This stenosis has worsened slightly since 2019.   IMPRESSION: 1. L3-4: Disc bulge. Facet and ligamentous hypertrophy. Mild stenosis of both lateral recesses but without visible neural compression. 2. L4-5: Chronic disc degeneration with loss of disc height. Endplate osteophytes and bulging of the disc. Facet and ligamentous hypertrophy, worse on the left. Moderate multifactorial stenosis. Some potential for neural compression in the lateral recesses, with stenosis slightly worse on the left than the right. 3. L5-S1: Chronic disc degeneration with loss of disc height. Endplate osteophytes and bulging of the disc more prominent towards the left. Facet degeneration and hypertrophy. Stenosis of the lateral recesses and neural foramina, left more than right. Some potential for neural compression at this level, particularly of the exiting left L5 nerve. This stenosis has worsened slightly since 2019.     Electronically Signed   By: Jim Graham M.D.   On: 06/25/2024 15:29  Imaging: No results found.   PMFS History: Patient Active Problem List   Diagnosis Date Noted   Chronic rhinitis 12/07/2023   Deviated nasal septum 12/07/2023   Hypertrophy of nasal turbinates 12/07/2023   Other specified disorders of eustachian tube, left ear 12/07/2023   Complete tear of right rotator cuff     Degenerative superior labral anterior-to-posterior (SLAP) tear of right shoulder    Shoulder arthritis    Chronic right shoulder pain 08/31/2021   History of arthroscopy of left shoulder 12/21/2016   GERD (gastroesophageal reflux disease) 05/25/2015   Agatston coronary artery calcium  score less than 100 02/08/2015   ED (erectile dysfunction) 02/08/2015   Pulmonary nodules 02/08/2015   Dilated aortic root 02/08/2015   Chest pain 08/24/2011   Hyperlipidemia 08/24/2011   Elevated BP 08/24/2011   History of palpitations 12/16/2010   Past Medical History:  Diagnosis Date   GERD (gastroesophageal reflux disease)    Hyperlipidemia    Palpitations     Family History  Adopted: Yes  Problem Relation Age of Onset   Heart attack Paternal Grandfather     Past Surgical History:  Procedure Laterality Date   bicep re-attachment     NASAL SEPTOPLASTY W/ TURBINOPLASTY Bilateral 12/31/2023   Procedure: SEPTOPLASTY, NOSE, WITH NASAL TURBINATE REDUCTION;  Surgeon: Karis Clunes, MD;  Location: [facility];  Service: ENT;  Laterality: Bilateral;   NASAL SINUS SURGERY     shoulder arthroscopy Left 2018   SHOULDER ARTHROSCOPY Right 08/26/2020   SHOULDER ARTHROSCOPY WITH DEBRIDEMENT AND BICEP TENDON REPAIR Right 10/18/2021   Procedure: RIGHT SHOULDER ARTHROSCOPY, DEBRIDEMENT, BICEPS TENODESIS;  Surgeon: Addie Cordella Hamilton, MD;  Location: [facility] OR;  Service: Orthopedics;  Laterality: Right;   Social History   Occupational History   Occupation: Fireman    CommentLexicographer  Tobacco Use   Smoking status: Never   Smokeless tobacco: Never  Vaping Use   Vaping status: Never Used  Substance and Sexual Activity   Alcohol use: No   Drug use: No   Sexual activity: Not on file

## 2024-09-22 NOTE — Telephone Encounter (Signed)
 Patient was seen today by Dr Dean-needing his surgery scheduled for 09/30/24 on his left shoulder to be cancelled.  This was per Dr Addie.

## 2024-09-29 NOTE — Progress Notes (Signed)
 Spoke with patient he states  surgery has been canceled I do not need it at this tim

## 2024-09-30 ENCOUNTER — Ambulatory Visit (HOSPITAL_COMMUNITY): Admit: 2024-09-30 | Admitting: Orthopedic Surgery

## 2024-09-30 DIAGNOSIS — Z01818 Encounter for other preprocedural examination: Secondary | ICD-10-CM

## 2024-09-30 SURGERY — ARTHROSCOPY, SHOULDER WITH DEBRIDEMENT
Anesthesia: General | Site: Shoulder | Laterality: Left

## 2024-10-01 ENCOUNTER — Ambulatory Visit: Admitting: Orthopedic Surgery

## 2024-10-02 ENCOUNTER — Encounter: Payer: Self-pay | Admitting: Physical Medicine and Rehabilitation

## 2024-10-02 ENCOUNTER — Ambulatory Visit: Admitting: Physical Medicine and Rehabilitation

## 2024-10-02 DIAGNOSIS — M79605 Pain in left leg: Secondary | ICD-10-CM

## 2024-10-02 DIAGNOSIS — R1032 Left lower quadrant pain: Secondary | ICD-10-CM | POA: Diagnosis not present

## 2024-10-02 NOTE — Progress Notes (Unsigned)
 Pain Scale   Average Pain 5 Patient advising he has lower back pain radiating to right side leg and pain is constant        +Driver, -BT, -Dye Allergies.

## 2024-10-02 NOTE — Progress Notes (Unsigned)
 Jim Graham - 68 y.o. male MRN [id number]  Date of birth: Aug 24, 1956  Office Visit Note: Visit Date: 10/02/2024 PCP: Loreli Kins, MD Referred by: Loreli Kins, MD  Subjective: Chief Complaint  Patient presents with   Lower Back - Pain   HPI: Jim Graham is a 68 y.o. male who comes in today for evaluation of chronic, worsening and severe left sided groin pain radiating down anterior leg to ankle. He is well known to us . He has also seen Dr. Georgina in the past. He states this pain is considerably different that prior issues. This new pain started in October about 4 days after undergoing left L4-L5 interlaminar epidural steroid in our office. He reports good relief of pain post injection for several days. His pain started after using leg press machine. He describes pain as intermittent, sore and aching, currently rates as 3 out of 10. His pain is most severe when sleeping, also reports pain with walking and getting in and out of car. Some relief of pain with home exercise regimen, rest and use of medications. He recently started therapy at [facility], reports some improvement in pain. Recent lumbar MRI imaging shows moderate multifactorial stenosis at L4-L5. Lateral recess and foraminal stenosis at L5-S1, left greater than right. 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 There is joint space narrowing and osteophytes to superolateral region of left hip. Patient denies focal weakness, numbness and tingling. No recent trauma or falls.      Review of Systems  Musculoskeletal:  Positive for joint pain.  Neurological:  Negative for tingling, sensory change, focal weakness and weakness.  All other systems reviewed and are negative.  Otherwise per HPI.  Assessment & Plan: Visit Diagnoses:    ICD-10-CM   1. Groin pain, left  R10.32 AMB referral to sports medicine    2. Pain in left leg  M79.605 AMB referral to sports medicine       Plan:  Findings:  Chronic, worsening and severe left sided groin pain radiating down anterior leg to ankle. Patient continues to have severe pain despite good conservative therapies such as home exercise regimen, rest and use of medications. Patients clinical presentation and exam are consistent with left hip etiology. Upon independent review, there is mild to moderate osteoarthritic changes and osteophyte formation to superolateral region of left hip. We discussed treatment plan in detail today. Next step is to perform diagnostic and hopefully therapeutic left intra-articular hip injection. He would like to have injection performed ASAP. I will place referral to my colleague Dr. Lonell Sprang, he can perform injection with ultrasound guidance. If good relief of pain with injection we would refer back to Dr. Addie for further evaluation of hip issues. He can continue to be active and [facility]. He can take Celebrex  twice a day while we are waiting on hip injection. No red flag symptoms noted upon exam today.     Meds & Orders: No orders of the defined types were placed in this encounter.   Orders Placed This Encounter  Procedures   AMB referral to sports medicine    Follow-up: Return for Left intra-articular hip injection with Dr. Sprang.   Procedures: No procedures performed      Clinical History: MRI LUMBAR SPINE WITHOUT CONTRAST   TECHNIQUE: Multiplanar, multisequence MR imaging of the lumbar spine was performed. No intravenous contrast was administered.   COMPARISON:  MRI 08/12/2018   FINDINGS: Segmentation:  5 lumbar type  vertebral bodies.   Alignment:  No malalignment.   Vertebrae: No fracture or focal bone lesion. Resolution of previously seen degenerative edema of the L5 and S1 vertebral bodies, present on the 2019 study. Marrow changes are now chronic without active edema.   Conus medullaris and cauda equina: Conus extends to the L1 level. Conus and cauda equina appear  normal.   Paraspinal and other soft tissues: Negative   Disc levels:   Minimal non-compressive disc bulges from T11-12 through L2-3. No compressive stenosis.   L3-4: Mild bulging of the disc. Facet and ligamentous hypertrophy. Mild stenosis of both lateral recesses but without visible neural compression.   L4-5: Chronic disc degeneration with loss of disc height. Endplate osteophytes and bulging of the disc. Facet and ligamentous hypertrophy, worse on the left. Moderate multifactorial stenosis. Some potential for neural compression in the lateral recesses, with stenosis slightly worse on the left than the right.   L5-S1: Chronic disc degeneration with loss of disc height. Endplate osteophytes and bulging of the disc more prominent towards the left. Facet degeneration and hypertrophy. Stenosis of the lateral recesses and neural foramina, left more than right. Some potential for neural compression at this level, particularly of the exiting left L5 nerve. This stenosis has worsened slightly since 2019.   IMPRESSION: 1. L3-4: Disc bulge. Facet and ligamentous hypertrophy. Mild stenosis of both lateral recesses but without visible neural compression. 2. L4-5: Chronic disc degeneration with loss of disc height. Endplate osteophytes and bulging of the disc. Facet and ligamentous hypertrophy, worse on the left. Moderate multifactorial stenosis. Some potential for neural compression in the lateral recesses, with stenosis slightly worse on the left than the right. 3. L5-S1: Chronic disc degeneration with loss of disc height. Endplate osteophytes and bulging of the disc more prominent towards the left. Facet degeneration and hypertrophy. Stenosis of the lateral recesses and neural foramina, left more than right. Some potential for neural compression at this level, particularly of the exiting left L5 nerve. This stenosis has worsened slightly since 2019.     Electronically Signed    By: Oneil Officer M.D.   On: 06/25/2024 15:29   He reports that he has never smoked. He has never used smokeless tobacco. No results for input(s): HGBA1C, LABURIC in the last 8760 hours.  Objective:  VS:  HT:    WT:   BMI:     BP:   HR: bpm  TEMP: ( )  RESP:  Physical Exam Vitals and nursing note reviewed.  HENT:     Head: Normocephalic and atraumatic.     Right Ear: External ear normal.     Left Ear: External ear normal.     Nose: Nose normal.     Mouth/Throat:     Mouth: Mucous membranes are moist.  Eyes:     Extraocular Movements: Extraocular movements intact.  Cardiovascular:     Rate and Rhythm: Normal rate.     Pulses: Normal pulses.  Pulmonary:     Effort: Pulmonary effort is normal.  Abdominal:     General: Abdomen is flat. There is no distension.  Musculoskeletal:        General: Tenderness present.     Cervical back: Normal range of motion.     Comments: Patient rises from seated position to standing without difficulty. Good lumbar range of motion. No pain noted with facet loading. 5/5 strength noted with bilateral hip flexion, knee flexion/extension, ankle dorsiflexion/plantarflexion and EHL. No clonus noted bilaterally. No pain upon  palpation of greater trochanters. Pain noted with internal rotation of left hip, positive Stinchfield on the left. Sensation intact bilaterally. Negative slump test bilaterally. Ambulates without aid, gait steady.     Skin:    General: Skin is warm and dry.     Capillary Refill: Capillary refill takes less than 2 seconds.  Neurological:     General: No focal deficit present.     Mental Status: He is alert and oriented to person, place, and time.  Psychiatric:        Mood and Affect: Mood normal.        Behavior: Behavior normal.     Ortho Exam  Imaging: No results found.  Past Medical/Family/Surgical/Social History: Medications & Allergies reviewed per EMR, new medications updated. Patient Active Problem List    Diagnosis Date Noted   Chronic rhinitis 12/07/2023   Deviated nasal septum 12/07/2023   Hypertrophy of nasal turbinates 12/07/2023   Other specified disorders of eustachian tube, left ear 12/07/2023   Complete tear of right rotator cuff    Degenerative superior labral anterior-to-posterior (SLAP) tear of right shoulder    Shoulder arthritis    Chronic right shoulder pain 08/31/2021   History of arthroscopy of left shoulder 12/21/2016   GERD (gastroesophageal reflux disease) 05/25/2015   Agatston coronary artery calcium  score less than 100 02/08/2015   ED (erectile dysfunction) 02/08/2015   Pulmonary nodules 02/08/2015   Dilated aortic root 02/08/2015   Chest pain 08/24/2011   Hyperlipidemia 08/24/2011   Elevated BP 08/24/2011   History of palpitations 12/16/2010   Past Medical History:  Diagnosis Date   GERD (gastroesophageal reflux disease)    Hyperlipidemia    Palpitations    Family History  Adopted: Yes  Problem Relation Age of Onset   Heart attack Paternal Grandfather    Past Surgical History:  Procedure Laterality Date   bicep re-attachment     NASAL SEPTOPLASTY W/ TURBINOPLASTY Bilateral 12/31/2023   Procedure: SEPTOPLASTY, NOSE, WITH NASAL TURBINATE REDUCTION;  Surgeon: Karis Clunes, MD;  Location: [facility];  Service: ENT;  Laterality: Bilateral;   NASAL SINUS SURGERY     shoulder arthroscopy Left 2018   SHOULDER ARTHROSCOPY Right 08/26/2020   SHOULDER ARTHROSCOPY WITH DEBRIDEMENT AND BICEP TENDON REPAIR Right 10/18/2021   Procedure: RIGHT SHOULDER ARTHROSCOPY, DEBRIDEMENT, BICEPS TENODESIS;  Surgeon: Addie Cordella Hamilton, MD;  Location: [facility] OR;  Service: Orthopedics;  Laterality: Right;   Social History   Occupational History   Occupation: Fireman    CommentLexicographer  Tobacco Use   Smoking status: Never   Smokeless tobacco: Never  Vaping Use   Vaping status: Never Used  Substance and Sexual Activity   Alcohol use: No   Drug use: No   Sexual  activity: Not on file

## 2024-10-03 ENCOUNTER — Other Ambulatory Visit: Payer: Self-pay

## 2024-10-03 ENCOUNTER — Encounter: Payer: Self-pay | Admitting: Sports Medicine

## 2024-10-03 ENCOUNTER — Ambulatory Visit: Admitting: Sports Medicine

## 2024-10-03 DIAGNOSIS — M25552 Pain in left hip: Secondary | ICD-10-CM | POA: Diagnosis not present

## 2024-10-03 DIAGNOSIS — M5442 Lumbago with sciatica, left side: Secondary | ICD-10-CM

## 2024-10-03 DIAGNOSIS — G8929 Other chronic pain: Secondary | ICD-10-CM

## 2024-10-03 DIAGNOSIS — M1612 Unilateral primary osteoarthritis, left hip: Secondary | ICD-10-CM

## 2024-10-03 MED ORDER — LIDOCAINE HCL 1 % IJ SOLN
4.0000 mL | INTRAMUSCULAR | Status: AC | PRN
  Administered 2024-10-03: 4 mL

## 2024-10-03 MED ORDER — METHYLPREDNISOLONE ACETATE 40 MG/ML IJ SUSP
80.0000 mg | INTRAMUSCULAR | Status: AC | PRN
  Administered 2024-10-03: 80 mg via INTRA_ARTICULAR

## 2024-10-03 NOTE — Progress Notes (Signed)
 "  Jim Graham - 68 y.o. male MRN [id number]  Date of birth: Dec 06, 1955  Office Visit Note: Visit Date: 10/03/2024 PCP: Loreli Kins, MD Referred by: Loreli Kins, MD  Subjective: Chief Complaint  Patient presents with   Left Hip - Pain   HPI: Jim Graham is a pleasant 68 y.o. male who presents today for chronic low back pain with more recent left hip/groin pain.  He is a very fit and active individual.  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 Works out often.  Anterior hip and groin pain began in October about 4 days after undergoing left L4-L5 interlaminar epidural steroid in our office. He reports good relief of pain post injection for several days. His pain started after using leg press machine. He describes pain as intermittent, sore and aching, currently rates as 3 out of 10. His pain is most severe when sleeping, also reports pain with walking and getting in and out of car. Some relief of pain with home exercise regimen, rest and use of medications. He recently started therapy at [facility], reports some improvement in pain.  Pertinent ROS were reviewed with the patient and found to be negative unless otherwise specified above in HPI.   Assessment & Plan: Visit Diagnoses:  1. Unilateral primary osteoarthritis, left hip   2. Pain in left hip   3. Chronic left-sided low back pain with left-sided sciatica    Plan: Impression is chronic left hip and leg pain with notable underlying lumbar DDD and history of radiculopathy, but symptoms today are moreso emanating from the intra-articular hip joint.  Previous x-rays were reviewed which do show at least mild arthritic change and narrowing within the superior lateral aspect of the hip joint.  Discussed with Gillermo for both diagnostic and hopefully therapeutic purposes proceeding with ultrasound-guided left intra-articular hip injection, patient tolerated well.  Advised on postinjection protocol.  He may  continue his Celebrex  100 mg once to twice daily in the interim.  I would like him to see how he does over the next 1 to 2 weeks to see what degree and where his pain is relieved.  If for some reason he is still having radicular symptoms going down the leg, he will notify Megan Williams/Dr. Eldonna to evaluate the spine further.  I would like to see him back irregardless in about 1 month to reevaluate the hip.   Follow-up: Return in about 1 month (around 11/03/2024) for L-hip.   Meds & Orders: No orders of the defined types were placed in this encounter.   Orders Placed This Encounter  Procedures   Large Joint Inj: L hip joint   US  Guided Needle Placement - No Linked Charges     Procedures: Large Joint Inj: L hip joint on 10/03/2024 3:18 PM Indications: pain Details: 22 G 3.5 in needle, ultrasound-guided anterior approach Medications: 4 mL lidocaine  1 %; 80 mg methylPREDNISolone  acetate 40 MG/ML Outcome: tolerated well, no immediate complications  Procedure: US -guided intra-articular hip injection, Left After discussion on risks/benefits/indications and informed verbal consent was obtained, a timeout was performed. Patient was lying supine on exam table. The hip was cleaned with betadine  and alcohol swabs . Then utilizing ultrasound guidance, the patient's femoral head and neck junction was identified and subsequently injected with 4:2 lidocaine :depomedrol via an in-plane approach with ultrasound visualization of the injectate administered into the hip joint. Patient tolerated procedure well without immediate complications.  Procedure, treatment alternatives, risks and benefits explained, specific risks discussed. Consent  was given by the patient. Immediately prior to procedure a time out was called to verify the correct patient, procedure, equipment, support staff and site/side marked as required. Patient was prepped and draped in the usual sterile fashion.          Clinical History: MRI  LUMBAR SPINE WITHOUT CONTRAST   TECHNIQUE: Multiplanar, multisequence MR imaging of the lumbar spine was performed. No intravenous contrast was administered.   COMPARISON:  MRI 08/12/2018   FINDINGS: Segmentation:  5 lumbar type vertebral bodies.   Alignment:  No malalignment.   Vertebrae: No fracture or focal bone lesion. Resolution of previously seen degenerative edema of the L5 and S1 vertebral bodies, present on the 2019 study. Marrow changes are now chronic without active edema.   Conus medullaris and cauda equina: Conus extends to the L1 level. Conus and cauda equina appear normal.   Paraspinal and other soft tissues: Negative   Disc levels:   Minimal non-compressive disc bulges from T11-12 through L2-3. No compressive stenosis.   L3-4: Mild bulging of the disc. Facet and ligamentous hypertrophy. Mild stenosis of both lateral recesses but without visible neural compression.   L4-5: Chronic disc degeneration with loss of disc height. Endplate osteophytes and bulging of the disc. Facet and ligamentous hypertrophy, worse on the left. Moderate multifactorial stenosis. Some potential for neural compression in the lateral recesses, with stenosis slightly worse on the left than the right.   L5-S1: Chronic disc degeneration with loss of disc height. Endplate osteophytes and bulging of the disc more prominent towards the left. Facet degeneration and hypertrophy. Stenosis of the lateral recesses and neural foramina, left more than right. Some potential for neural compression at this level, particularly of the exiting left L5 nerve. This stenosis has worsened slightly since 2019.   IMPRESSION: 1. L3-4: Disc bulge. Facet and ligamentous hypertrophy. Mild stenosis of both lateral recesses but without visible neural compression. 2. L4-5: Chronic disc degeneration with loss of disc height. Endplate osteophytes and bulging of the disc. Facet and ligamentous hypertrophy, worse  on the left. Moderate multifactorial stenosis. Some potential for neural compression in the lateral recesses, with stenosis slightly worse on the left than the right. 3. L5-S1: Chronic disc degeneration with loss of disc height. Endplate osteophytes and bulging of the disc more prominent towards the left. Facet degeneration and hypertrophy. Stenosis of the lateral recesses and neural foramina, left more than right. Some potential for neural compression at this level, particularly of the exiting left L5 nerve. This stenosis has worsened slightly since 2019.     Electronically Signed   By: Oneil Officer M.D.   On: 06/25/2024 15:29  He reports that he has never smoked. He has never used smokeless tobacco. No results for input(s): HGBA1C, LABURIC in the last 8760 hours.  Objective:    Physical Exam  Gen: Well-appearing, in no acute distress; non-toxic CV: Well-perfused. Warm.  Resp: Breathing unlabored on room air; no wheezing. Psych: Fluid speech in conversation; appropriate affect; normal thought process  Ortho Exam - Left hip: There is fluid logroll with very minimal restriction with internal > external ROM. + Markedly positive FADIR test, equivocal FABER test.  Positive Stinchfield test.  Imaging:  *I did independently review and interpret the lumbar spine x-ray from 08/06/2024 with attention to the AP pelvis to evaluate the hip joint.  The left hip does have joint space narrowing over the superior lateral aspect more so.  There is mild to mild to moderate arthritic change underlying  as well as degree of subchondral sclerosis over the acetabular rim.  There is a mildly elongated femoral head neck on the left side.  There is no advanced arthropathy however.  Past Medical/Family/Surgical/Social History: Medications & Allergies reviewed per EMR, new medications updated. Patient Active Problem List   Diagnosis Date Noted   Chronic rhinitis 12/07/2023   Deviated nasal septum  12/07/2023   Hypertrophy of nasal turbinates 12/07/2023   Other specified disorders of eustachian tube, left ear 12/07/2023   Complete tear of right rotator cuff    Degenerative superior labral anterior-to-posterior (SLAP) tear of right shoulder    Shoulder arthritis    Chronic right shoulder pain 08/31/2021   History of arthroscopy of left shoulder 12/21/2016   GERD (gastroesophageal reflux disease) 05/25/2015   Agatston coronary artery calcium  score less than 100 02/08/2015   ED (erectile dysfunction) 02/08/2015   Pulmonary nodules 02/08/2015   Dilated aortic root 02/08/2015   Chest pain 08/24/2011   Hyperlipidemia 08/24/2011   Elevated BP 08/24/2011   History of palpitations 12/16/2010   Past Medical History:  Diagnosis Date   GERD (gastroesophageal reflux disease)    Hyperlipidemia    Palpitations    Family History  Adopted: Yes  Problem Relation Age of Onset   Heart attack Paternal Grandfather    Past Surgical History:  Procedure Laterality Date   bicep re-attachment     NASAL SEPTOPLASTY W/ TURBINOPLASTY Bilateral 12/31/2023   Procedure: SEPTOPLASTY, NOSE, WITH NASAL TURBINATE REDUCTION;  Surgeon: Karis Clunes, MD;  Location: [facility];  Service: ENT;  Laterality: Bilateral;   NASAL SINUS SURGERY     shoulder arthroscopy Left 2018   SHOULDER ARTHROSCOPY Right 08/26/2020   SHOULDER ARTHROSCOPY WITH DEBRIDEMENT AND BICEP TENDON REPAIR Right 10/18/2021   Procedure: RIGHT SHOULDER ARTHROSCOPY, DEBRIDEMENT, BICEPS TENODESIS;  Surgeon: Addie Cordella Hamilton, MD;  Location: [facility];  Service: Orthopedics;  Laterality: Right;   Social History   Occupational History   Occupation: Fireman    CommentLexicographer  Tobacco Use   Smoking status: Never   Smokeless tobacco: Never  Vaping Use   Vaping status: Never Used  Substance and Sexual Activity   Alcohol use: No   Drug use: No   Sexual activity: Not on file   "

## 2024-10-07 ENCOUNTER — Ambulatory Visit: Admitting: Physical Medicine and Rehabilitation

## 2024-10-08 ENCOUNTER — Telehealth: Payer: Self-pay | Admitting: Sports Medicine

## 2024-10-08 NOTE — Telephone Encounter (Signed)
 Pt called saying that he is having a lot of trouble with his left hip. He's walking with a limp know and the pain is getting worse. Please advise. Call back number is [phone].

## 2024-10-13 ENCOUNTER — Telehealth: Payer: Self-pay | Admitting: Physical Medicine and Rehabilitation

## 2024-10-13 ENCOUNTER — Other Ambulatory Visit: Payer: Self-pay | Admitting: Physical Medicine and Rehabilitation

## 2024-10-13 ENCOUNTER — Encounter: Admitting: Surgical

## 2024-10-13 ENCOUNTER — Ambulatory Visit: Admitting: Surgical

## 2024-10-13 ENCOUNTER — Telehealth: Payer: Self-pay | Admitting: Sports Medicine

## 2024-10-13 DIAGNOSIS — M5416 Radiculopathy, lumbar region: Secondary | ICD-10-CM

## 2024-10-13 DIAGNOSIS — M25552 Pain in left hip: Secondary | ICD-10-CM

## 2024-10-13 MED ORDER — DICLOFENAC SODIUM 75 MG PO TBEC: 75.0000 mg | DELAYED_RELEASE_TABLET | Freq: Two times a day (BID) | ORAL | 0 refills | Status: AC

## 2024-10-13 NOTE — Telephone Encounter (Signed)
 Pt request another back injection from Dr Eldonna

## 2024-10-13 NOTE — Telephone Encounter (Signed)
 Pt states he was given a left hip injection by Dr Burnetta on 10/03/24 and now today he can not walk and request a call back ASAP

## 2024-10-14 ENCOUNTER — Telehealth: Payer: Self-pay | Admitting: Surgical

## 2024-10-14 ENCOUNTER — Other Ambulatory Visit

## 2024-10-14 ENCOUNTER — Encounter: Payer: Self-pay | Admitting: Surgical

## 2024-10-14 NOTE — Telephone Encounter (Signed)
 Pt called back stated that he has an MRI appt on 10/21/2024 but we weren't able to get him in to go over the results until 11/05/2024.  Pt was placed on the wait list but would like to see if he could get something sooner due to him being in a lot of hip pain.

## 2024-10-14 NOTE — Telephone Encounter (Signed)
 Urgent Message ,Pt called stating that [facility] called to set an appt for today but they are asking for Pre auth now. Please send so pt can change MRI appt to today. Please call pt when it has been se. Pt number is [phone]

## 2024-10-14 NOTE — Progress Notes (Signed)
 "  Office Visit Note   Patient: Jim Graham           Date of Birth: 04-Dec-1955           MRN: [id number] Visit Date: 10/13/2024 Requested by: Loreli Kins, MD [address] [place],  [place] [address] PCP: Loreli Kins, MD  Subjective: Chief Complaint  Patient presents with   Lower Back - Pain   Left Leg - Pain    HPI: Jim Graham is a 68 y.o. male who presents to the office reporting left hip and leg pain.  Patient states that he has history of back pain.  Had somewhat recent injection with Dr. Eldonna which was left L4-L5 lumbar ESI on 08/04/2024.  He feels like this helped with his back pain but caused increased leg pain.  He states that he had no leg pain prior to the injection but there are multiple notes prior to the injection demonstrating that patient did report left leg radicular pain.  He recently saw Duwaine Pouch, NP and was referred for left hip intra-articular injection by Dr. Burnetta.  This was done on 10/03/2024 and he has had significantly increased left hip pain ever since this injection.  He describes primarily groin and anterior hip pain that will radiate down to the knee along the IT band.  He is a very active individual and tries to workout daily and these aches and pains are getting in the way of his activity level.  He has been taking Celebrex  twice daily and Tylenol  which she takes chronically.  He denies any red flag symptoms.  No weakness or giving out of the leg.  His workout routine in terms of his leg exercises consists of using a crosstrainer and doing weight stack machines with leg extension and leg flexion but no specific gluteal strengthening exercises or compounds movements such as deadlifts or squat, etc.                ROS: All systems reviewed are negative as they relate to the chief complaint within the history of present illness.  Patient denies fevers or chills.  Assessment & Plan: Visit Diagnoses:  1. Pain in left hip   2.  Lumbar radiculopathy     Plan: Impression is 68 year old male who has history of left hip arthritis that has not responded to recent injection.  Also has MRI scan of the lumbar spine from mid-to-late 2025 demonstrating multifactorial stenosis that could to be contributing to a lot of his symptoms.  We had lengthy discussion about possible pain generators in his hip.  Think that it would be appropriate to obtain MRI of the left hip to evaluate for any other pathology that contributes to groin or anterior hip pain aside from the existing hip arthritis that is obvious on his lumbar spine radiographs.  We will also try and repeat the left L4-L5 epidural steroid injection he had with Dr. Eldonna and see how this affects his current symptoms.  Another thought would be this may be related to iliotibial band syndrome or overall gluteal weakness given his lack of active internal rotation of the left hip relative to the right hip and the distribution of his symptoms that is reproduced with palpation over the TFL.  Plan to try shockwave therapy in the meantime while we obtain left hip MRI with Dr. Burnetta to target ASIS and the course of the TFL and see if this relieves any of his symptoms to any  extent.  Follow-Up Instructions: No follow-ups on file.   Orders:  Orders Placed This Encounter  Procedures   MR Hip Left w/o contrast   Ambulatory referral to Physical Medicine Rehab   Meds ordered this encounter  Medications   diclofenac  (VOLTAREN ) 75 MG EC tablet    Sig: Take 1 tablet (75 mg total) by mouth 2 (two) times daily.    Dispense:  60 tablet    Refill:  0      Procedures: No procedures performed   Clinical Data: No additional findings.  Objective: Vital Signs: There were no vitals taken for this visit.  Physical Exam:  Constitutional: Patient appears well-developed HEENT:  Head: Normocephalic Eyes:EOM are normal Neck: Normal range of motion Cardiovascular: Normal  rate Pulmonary/chest: Effort normal Neurologic: Patient is alert Skin: Skin is warm Psychiatric: Patient has normal mood and affect  Ortho Exam: Ortho exam demonstrates left hip with tenderness over ASIS moderately and AIIS mildly.  No significant trochanteric tenderness in the lateral or posterior facet of the trochanter.  He does have excellent internal rotation strength of his right hip with about 30 degrees of passive internal rotation while he is seated.  Comparatively, he has about 15 to 20 degrees of passive internal rotation with the left hip and is unable to actively maintain this in an internally rotated position without pain.  No effusion in the left knee.  He has excellent and intact strength rated 5/5 of dorsiflexion, plantarflexion, quad, hamstring, hip flexion.  Does have pain reproduced with FADIR sign.  Has tenderness along the distribution of TFL that reproduces pain radiating down the IT band.  Negative straight leg raise.  No clonus noted bilaterally.  2+ deep tendon reflexes of patellar tendon and Achilles tendon reflexes bilaterally.  No tenderness over the SI joint of the left hemipelvis.  Specialty Comments:  MRI LUMBAR SPINE WITHOUT CONTRAST   TECHNIQUE: Multiplanar, multisequence MR imaging of the lumbar spine was performed. No intravenous contrast was administered.   COMPARISON:  MRI 08/12/2018   FINDINGS: Segmentation:  5 lumbar type vertebral bodies.   Alignment:  No malalignment.   Vertebrae: No fracture or focal bone lesion. Resolution of previously seen degenerative edema of the L5 and S1 vertebral bodies, present on the 2019 study. Marrow changes are now chronic without active edema.   Conus medullaris and cauda equina: Conus extends to the L1 level. Conus and cauda equina appear normal.   Paraspinal and other soft tissues: Negative   Disc levels:   Minimal non-compressive disc bulges from T11-12 through L2-3. No compressive stenosis.   L3-4: Mild  bulging of the disc. Facet and ligamentous hypertrophy. Mild stenosis of both lateral recesses but without visible neural compression.   L4-5: Chronic disc degeneration with loss of disc height. Endplate osteophytes and bulging of the disc. Facet and ligamentous hypertrophy, worse on the left. Moderate multifactorial stenosis. Some potential for neural compression in the lateral recesses, with stenosis slightly worse on the left than the right.   L5-S1: Chronic disc degeneration with loss of disc height. Endplate osteophytes and bulging of the disc more prominent towards the left. Facet degeneration and hypertrophy. Stenosis of the lateral recesses and neural foramina, left more than right. Some potential for neural compression at this level, particularly of the exiting left L5 nerve. This stenosis has worsened slightly since 2019.   IMPRESSION: 1. L3-4: Disc bulge. Facet and ligamentous hypertrophy. Mild stenosis of both lateral recesses but without visible neural compression. 2. L4-5: Chronic  disc degeneration with loss of disc height. Endplate osteophytes and bulging of the disc. Facet and ligamentous hypertrophy, worse on the left. Moderate multifactorial stenosis. Some potential for neural compression in the lateral recesses, with stenosis slightly worse on the left than the right. 3. L5-S1: Chronic disc degeneration with loss of disc height. Endplate osteophytes and bulging of the disc more prominent towards the left. Facet degeneration and hypertrophy. Stenosis of the lateral recesses and neural foramina, left more than right. Some potential for neural compression at this level, particularly of the exiting left L5 nerve. This stenosis has worsened slightly since 2019.     Electronically Signed   By: Oneil Officer M.D.   On: 06/25/2024 15:29  Imaging: No results found.   PMFS History: Patient Active Problem List   Diagnosis Date Noted   Chronic rhinitis 12/07/2023    Deviated nasal septum 12/07/2023   Hypertrophy of nasal turbinates 12/07/2023   Other specified disorders of eustachian tube, left ear 12/07/2023   Complete tear of right rotator cuff    Degenerative superior labral anterior-to-posterior (SLAP) tear of right shoulder    Shoulder arthritis    Chronic right shoulder pain 08/31/2021   History of arthroscopy of left shoulder 12/21/2016   GERD (gastroesophageal reflux disease) 05/25/2015   Agatston coronary artery calcium  score less than 100 02/08/2015   ED (erectile dysfunction) 02/08/2015   Pulmonary nodules 02/08/2015   Dilated aortic root 02/08/2015   Chest pain 08/24/2011   Hyperlipidemia 08/24/2011   Elevated BP 08/24/2011   History of palpitations 12/16/2010   Past Medical History:  Diagnosis Date   GERD (gastroesophageal reflux disease)    Hyperlipidemia    Palpitations     Family History  Adopted: Yes  Problem Relation Age of Onset   Heart attack Paternal Grandfather     Past Surgical History:  Procedure Laterality Date   bicep re-attachment     NASAL SEPTOPLASTY W/ TURBINOPLASTY Bilateral 12/31/2023   Procedure: SEPTOPLASTY, NOSE, WITH NASAL TURBINATE REDUCTION;  Surgeon: Karis Clunes, MD;  Location: [facility];  Service: ENT;  Laterality: Bilateral;   NASAL SINUS SURGERY     shoulder arthroscopy Left 2018   SHOULDER ARTHROSCOPY Right 08/26/2020   SHOULDER ARTHROSCOPY WITH DEBRIDEMENT AND BICEP TENDON REPAIR Right 10/18/2021   Procedure: RIGHT SHOULDER ARTHROSCOPY, DEBRIDEMENT, BICEPS TENODESIS;  Surgeon: Addie Cordella Hamilton, MD;  Location: [facility];  Service: Orthopedics;  Laterality: Right;   Social History   Occupational History   Occupation: Fireman    CommentLexicographer  Tobacco Use   Smoking status: Never   Smokeless tobacco: Never  Vaping Use   Vaping status: Never Used  Substance and Sexual Activity   Alcohol use: No   Drug use: No   Sexual activity: Not on file        "

## 2024-10-15 NOTE — Telephone Encounter (Signed)
 Patient is scheduled for MRI on 10/21/2024. He would like Luke to call with results. He wants Dr. Burnetta to know that he is feeling popping in the back and hip when working with [facility] so it is not as tight as it was. He is still waiting to hear from [place] about an injection. I advise, insurances are changing beginning of the year and at times we have to wait so that we can get approval. He thinks his insurance is just going to get better this year.  I advised someone would call him once we have authorization.  He requests Dr. Burnetta, Herlene, Dr. Addie, and Dr. Vernetta get together to figure out what is going on with him.  I will hold message until he has MRI so Herlene can call.  Dr. Brooks-=FYI on the popping of the hip and back.

## 2024-10-17 ENCOUNTER — Encounter: Payer: Self-pay | Admitting: Orthopedic Surgery

## 2024-10-21 ENCOUNTER — Inpatient Hospital Stay: Admission: RE | Admit: 2024-10-21 | Discharge: 2024-10-21 | Attending: Surgical

## 2024-10-21 DIAGNOSIS — M25552 Pain in left hip: Secondary | ICD-10-CM

## 2024-10-24 ENCOUNTER — Other Ambulatory Visit (HOSPITAL_COMMUNITY): Payer: Self-pay

## 2024-10-24 ENCOUNTER — Encounter: Payer: Self-pay | Admitting: Internal Medicine

## 2024-10-24 ENCOUNTER — Ambulatory Visit: Admitting: Internal Medicine

## 2024-10-24 ENCOUNTER — Ambulatory Visit
Admission: RE | Admit: 2024-10-24 | Discharge: 2024-10-24 | Disposition: A | Source: Ambulatory Visit | Attending: Internal Medicine | Admitting: Internal Medicine

## 2024-10-24 VITALS — BP 136/82 | HR 70 | Resp 16 | Ht 70.0 in | Wt 210.0 lb

## 2024-10-24 DIAGNOSIS — T466X5D Adverse effect of antihyperlipidemic and antiarteriosclerotic drugs, subsequent encounter: Secondary | ICD-10-CM | POA: Diagnosis not present

## 2024-10-24 DIAGNOSIS — M791 Myalgia, unspecified site: Secondary | ICD-10-CM

## 2024-10-24 DIAGNOSIS — E785 Hyperlipidemia, unspecified: Secondary | ICD-10-CM | POA: Insufficient documentation

## 2024-10-24 DIAGNOSIS — I4891 Unspecified atrial fibrillation: Secondary | ICD-10-CM | POA: Diagnosis not present

## 2024-10-24 DIAGNOSIS — I2510 Atherosclerotic heart disease of native coronary artery without angina pectoris: Secondary | ICD-10-CM

## 2024-10-24 DIAGNOSIS — I77810 Thoracic aortic ectasia: Secondary | ICD-10-CM | POA: Insufficient documentation

## 2024-10-24 DIAGNOSIS — I517 Cardiomegaly: Secondary | ICD-10-CM | POA: Diagnosis not present

## 2024-10-24 DIAGNOSIS — K219 Gastro-esophageal reflux disease without esophagitis: Secondary | ICD-10-CM | POA: Insufficient documentation

## 2024-10-24 DIAGNOSIS — R9431 Abnormal electrocardiogram [ECG] [EKG]: Secondary | ICD-10-CM | POA: Diagnosis not present

## 2024-10-24 DIAGNOSIS — I080 Rheumatic disorders of both mitral and aortic valves: Secondary | ICD-10-CM | POA: Diagnosis not present

## 2024-10-24 LAB — ECHOCARDIOGRAM COMPLETE
Calc EF: 56.7 %
Height: 70 in
S' Lateral: 2.7 cm
Single Plane A2C EF: 50.4 %
Single Plane A4C EF: 60.3 %
Weight: 3360 [oz_av]

## 2024-10-24 MED ORDER — APIXABAN 5 MG PO TABS
5.0000 mg | ORAL_TABLET | Freq: Two times a day (BID) | ORAL | 1 refills | Status: AC
  Filled 2024-10-24: qty 60, 30d supply, fill #0
  Filled 2024-11-19: qty 60, 30d supply, fill #1

## 2024-10-24 NOTE — Patient Instructions (Addendum)
 Medication Instructions:  Stop: Aspirin  Start: Apixaban  (Eliquis ) 5 mg, one tablet, two times daily   Testing/Procedures: Your physician has requested that you have an echocardiogram. Echocardiography is a painless test that uses sound waves to create images of your heart. It provides your doctor with information about the size and shape of your heart and how well your hearts chambers and valves are working. This procedure takes approximately one hour. There are no restrictions for this procedure. Please do NOT wear cologne, perfume, aftershave, or lotions (deodorant is allowed). Please arrive 15 minutes prior to your appointment time.  Please note: We ask at that you not bring children with you during ultrasound (echo/ vascular) testing. Due to room size and safety concerns, children are not allowed in the ultrasound rooms during exams. Our front office staff cannot provide observation of children in our lobby area while testing is being conducted. An adult accompanying a patient to their appointment will only be allowed in the ultrasound room at the discretion of the ultrasound technician under special circumstances. We apologize for any inconvenience.   Follow-Up: At [facility], you and your health needs are our priority.  As part of our continuing mission to provide you with exceptional heart care, our providers are all part of one team.  This team includes your primary Cardiologist (physician) and Advanced Practice Providers or APPs (Physician Assistants and Nurse Practitioners) who all work together to provide you with the care you need, when you need it.  Your next appointment:    11/26/2024 at 11:00 am at the [facility] Location ([id number])  Provider:   Vinie JAYSON Maxcy, MD

## 2024-10-24 NOTE — Progress Notes (Signed)
" °  Echocardiogram 2D Echocardiogram has been performed.  Koleen KANDICE Popper, RDCS 10/24/2024, 2:21 PM "

## 2024-10-24 NOTE — Progress Notes (Signed)
 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 He is also on chronic suppressive therapy for HSV. He was praised evaluated for atypical chest pain and palpitations in 2012. He underwent an exercise echocardiogram which was negative for ischemia. He also wore monitor for. At time. Symptoms improved and were related to stress. He does not know his medical history secondary to being adopted. Recently he's had more discomfort mostly in his left shoulder and some in the left upper chest but no evidence for substernal chest pain. He's also been having worsening symptoms which she attributes to reflux. He was switched from Nexium to Dexilant. In addition, he notes that when he takes Zantac he has marked improvement in his symptoms. 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  The pleasure see Jim Graham back in the office today. Overall he is doing well denies any further chest pain or shortness of breath. In fact she's managed to lose about 20 pounds since his last office visit. He says he's on the [employer]. This is a combination of caloric restriction, hCG, other vitamins and appetite suppressants. He also continues to exercise. He had a lipid profile performed which showed total cholesterol 140, glyceride 66, HL 43, LDL 84. He's currently on Crestor  5 mg daily. The CT scan of his coronary Arteries was performed which demonstrated a low coronary artery calcium  score of 46, but was located in the proximal to mid LAD. The aortic root was mildly dilated 4.0 cm. An over read by [facility] radiology indicated several small subcentimeter pulmonary nodules, the largest  being 5 mm. A repeat CT scan was recommended in one year. He is not a smoker and at lower risk for bronchogenic carcinoma. In addition, today he reported some recent erectile dysfunction, which was unrelated to her follow-up. He is asking about Viagra .  Jim Graham returns today for follow-up. Over the weekend he had an episode where he developed pain on the left and right sides of his chest laterally that radiated down his arms. There is no central chest discomfort. It seemed to occurred fairly recently after eating. He did take a Zantac and went to the emergency room. He said he waited outside of the emergency room and his symptoms resolved and he ultimately was never seen. He then started taking his Dexilant again. He has noted some improvement in his symptoms but is not completely resolved. He says he recently saw his gastroenterologist but was not having any symptoms at the time. He continues to exercise at a significant rate without any chest pain or shortness of breath.  Jim Graham returns today for follow-up. He reports some occasional left shoulder pain but is able to exercise at a high rate without any chest pain or worsening shortness of breath. He exercises fairly regularly. He recently thought he was having side effects from his Crestor  discontinued that however the symptoms did not go away and he restarted his medicine. He is due for repeat lipid profile. He is also due for one year follow-up of his last chest CT which demonstrated several small sub-pulmonary nodules. There was also a mildly dilated aortic root to 4.0  cm.  04/23/2017  Jim Graham was seen today in follow-up. He underwent a recent repeat CT which showed no evidence of aortic dilatation and no change in his small subpulmonary nodules. No further CT was recommended. He denies any chest pain or worsening shortness of breath. He continues to exercise regularly. 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 We recently received labs from his primary care provider which shows a total cholesterol of 149, temperature glyceride 88, HDL-C 40, LDL-C 91 and non-HDL-C of 890. I review the results today including his 10 year and 74 year MESA and pulled cohort risk assessments given his coronary artery calcium . He is at 6.5 and 7.5% risk respectively based on those databases and has a long-term risk of about 36% of developing a hard cardiovascular event. With this in mind his LDL-C is a goal less than 899, however his non-HDL-C is at goal <130. We discussed that based on current data if he were to be very aggressive about his medical therapy he may want to try to drive his LDL-C less than 70 or at least get his non-HDL-C less than 100. We may accomplish this with a small increase in his Crestor .  06/07/2018  Jim Graham seen today in follow-up.  Overall seems to be doing well.  He denies any chest pain or shortness of breath.  He exercises regularly.  Recently gained some weight combination of both muscle mass and he feels like he may be more sedentary.  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  Recent labs in April 2019 showed total cholesterol 158, HDL 45, LDL 87 and triglycerides 132.  EKG sinus rhythm at 84.  04/28/2020  Jim Graham is seen today in follow-up.  He seems to be doing very well.  He is exercising more regularly.  He had gained some weight during the pandemic but is managed to get it off and generally is feeling much better.  His lipids have improved significantly.  LDL cholesterol is around 70.  EKG is normal.  His blood pressure is well controlled.  09/22/2022  Jim Graham is seen today in follow-up.  He is overall without complaints.  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  He did have prior dilation of the ascending aorta seen on a CT scan back in 2016 however  subsequently this was not again visualized.  His calcium  score at that time was 46 which was 56 percentile for age and sex matched controls.  Blood pressure has been well-controlled.  EKG today is normal with some nonspecific T wave changes.  He remains on low-dose aspirin and rosuvastatin .  Recent labs showed total cholesterol 138, HDL 42, triglycerides 103 and LDL 78.  10/23/2023  Jim Graham is seen today in follow-up.  Overall he is without complaints.  He denies any worsening chest pain or shortness of breath.  He does have some anxiety about his health.  Namely he has some concerns about the increase in his calcium  score that occurred over several years.  I did review that again with him.  His score again in January 2024 was 151 which was 62nd percentile.  The ascending aorta did measure larger at 43 mm.  I had recommended follow-up which is due this month.  I did recommend an increase in his statin up to 20 mg rosuvastatin .  His last lipid showed good control with an LDL of 70, total cholesterol 133, triglycerides 116 and HDL 42.  This was in July but also repeat labs in November showed total cholesterol 140, HDL 40, triglycerides 124 and LDL 77.  He does exercise quite a bit including weight lifting.  On supplemental testosterone .  10/24/2024  Jim Graham is seen today in follow-up.  He says he is doing pretty well.  He is lost a lot of weight recently using compounded semaglutide through [facility] sky [facility].  He also has been continuing to exercise.  He had a decrease in his testosterone  when switching from injection to topical preparation and recently got approved to go back on that.  He tells me he could not tolerate his statin and has already stopped 2 other statins and therefore is intolerant.  He has been on ezetimibe and he reported that his neighbor actually provided him with some Nexletol.  The cost is too high to sustain and he is considering an alternative.  We talked today about possibly getting  him on Repatha as an alternative.  He said insurance may be more favorable for this.  In addition an EKG was routinely performed today.  This shows what is new onset atrial fibrillation with a heart rate of 70.  I did demonstrate the EKG with him today.  He has an Scientist, Physiological and I helped him set that up in order to take an EKG which we performed in the office with a watch and this does show atrial fibrillation as well.  His CHA2DS2-VASc score is  CHA2DS2-VASc Score = 1  The patient's score is based upon: CHF History: 0 HTN History: 0 Diabetes History: 0 Stroke History: 0 Vascular Disease History: 0 Age Score: 1 Gender Score: 0      PMHx:  Past Medical History:  Diagnosis Date   GERD (gastroesophageal reflux disease)    Hyperlipidemia    Palpitations     Past Surgical History:  Procedure Laterality Date   bicep re-attachment     NASAL SEPTOPLASTY W/ TURBINOPLASTY Bilateral 12/31/2023   Procedure: SEPTOPLASTY, NOSE, WITH NASAL TURBINATE REDUCTION;  Surgeon: Karis Clunes, MD;  Location: [facility];  Service: ENT;  Laterality: Bilateral;   NASAL SINUS SURGERY     shoulder arthroscopy Left 2018   SHOULDER ARTHROSCOPY Right 08/26/2020   SHOULDER ARTHROSCOPY WITH DEBRIDEMENT AND BICEP TENDON REPAIR Right 10/18/2021   Procedure: RIGHT SHOULDER ARTHROSCOPY, DEBRIDEMENT, BICEPS TENODESIS;  Surgeon: Addie Cordella Hamilton, MD;  Location: [facility] OR;  Service: Orthopedics;  Laterality: Right;    FAMHx:  Family History  Adopted: Yes  Problem Relation Age of Onset   Heart attack Paternal Grandfather     SOCHx:   reports that he has never smoked. He has never used smokeless tobacco. He reports that he does not drink alcohol and does not use drugs.  ALLERGIES:  Allergies  Allergen Reactions   Pravastatin     Other Reaction(s): leg pain   Simvastatin     Other Reaction(s): chest pain   Pitavastatin Palpitations   Tamsulosin  Itching    ROS: Pertinent items noted in HPI and  remainder of comprehensive ROS otherwise negative.  HOME MEDS: Current Outpatient Medications  Medication Sig Dispense Refill   apixaban  (ELIQUIS ) 5 MG TABS tablet Take 1 tablet (5 mg total) by mouth 2 (two) times daily. 180 tablet 1   celecoxib  (CELEBREX ) 200 MG capsule Take by mouth.     diazepam  (VALIUM ) 5 MG  tablet Take one tablet by mouth with light food one hour prior to procedure. 1 tablet 0   diclofenac  (VOLTAREN ) 75 MG EC tablet Take 1 tablet (75 mg total) by mouth 2 (two) times daily. 60 tablet 0   fluticasone  (FLONASE ) 50 MCG/ACT nasal spray Place 2 sprays into both nostrils daily. 16 g 10   pantoprazole (PROTONIX) 40 MG tablet Take 40 mg by mouth as needed (acid reflux).     propranolol (INDERAL) 10 MG tablet Take 10 mg by mouth as needed (anxiety). PRN anxiety     sildenafil  (REVATIO ) 20 MG tablet Take 1 to 5 tablets as needed 1 hour prior to intercourse 90 tablet 3   SYRINGE-NEEDLE, DISP, 3 ML (B-D 3CC LUER-LOK SYR 23GX1) 23G X 1 3 ML MISC once a week.     tadalafil  (CIALIS ) 10 MG tablet Take 1 tablet (10 mg total) by mouth daily as needed for erectile dysfunction. 90 tablet 3   testosterone  cypionate (DEPOTESTOSTERONE CYPIONATE) 200 MG/ML injection Inject 100 mg into the muscle every 7 (seven) days.     valACYclovir (VALTREX) 500 MG tablet Take 500 mg by mouth 2 (two) times daily.     ZETIA 10 MG tablet Take 10 mg by mouth daily.     No current facility-administered medications for this visit.    LABS/IMAGING: No results found for this or any previous visit (from the past 48 hours). No results found.  VITALS: BP 136/82 (BP Location: Right Arm, Patient Position: Sitting, Cuff Size: Large)   Pulse 70   Resp 16   Ht 5' 10 (1.778 m)   Wt 210 lb (95.3 kg)   SpO2 98%   BMI 30.13 kg/m   EXAM: General appearance: alert and no distress Lungs: clear to auscultation bilaterally Heart: irregularly irregular rhythm Extremities: extremities normal, atraumatic, no cyanosis  or edema Neurologic: Grossly normal   EKG: EKG Interpretation Date/Time:  Friday October 24 2024 09:22:29 EST Ventricular Rate:  76 PR Interval:    QRS Duration:  96 QT Interval:  378 QTC Calculation: 425 R Axis:   50  Text Interpretation: Atrial fibrillation Nonspecific T wave abnormality When compared with ECG of 23-Oct-2023 15:24, Atrial fibrillation has replaced Sinus rhythm Confirmed by Mona Kent [phone]) on 10/24/2024 9:34:10 AM    ASSESSMENT: New onset atrial fibrillation-CHA2DS2-VASc score 1 (age) Statin intolerant-myalgias Atypical left upper chest/shoulder pain - low coronary calcium  score 46 (2016) -> increased to 151, 62nd percentile (10/2022) GERD Dyslipidemia Erectile dysfunction Mildly dilated aortic root to 4.0 cm > increased to 4.3 cm (10/2022) Subcentimeter pulmonary nodules - stable, do not require follow-up Low testosterone   PLAN: 1.   Jim Graham is a new onset atrial fibrillation.  He is cardiac unaware of this.  Rate is controlled on no beta-blockers.  He does have as needed propranolol but rarely takes it.  He is on low-dose aspirin.  I advised him to stop that and we will start him on Eliquis  5 mg twice daily.  I helped him set up his [employer] Watch and have encouraged him to intermittently do an EKG to see if he remains in atrial fibrillation.  We will plan a follow-up to see if he remains in A-fib and then schedule cardioversion after a month of anticoagulation.  Additionally, he is intolerant to statins.  He has tried 3 different statins and has been on ezetimibe.  His LDL cholesterol remains elevated at 93 in November 2025.  He may benefit from Repatha.  He had tried  Nexletol but could not get it covered with insurance.  I would recommend continuing ezetimibe and we reach out for prior authorization for Repatha.  I suggested that he start this after we are able to achieve restoring sinus rhythm.  Follow-up with me in 1 month to repeat an EKG and determine if  he needs a cardioversion.  Vinie KYM Maxcy, MD, [facility], FNLA, FACP  [place]  [facility]  Medical Director of the [facility]  Card[facility]ovascular [facility] Reduction [facility] Diplomate of the American Board of Clinical Lipidology Attending Cardiologist  Direct Dial: [phone]  Fax: [phone]  Website:  [email or web address] Vinie BROCKS Ormond Lazo 10/24/2024, 10:23 AM "

## 2024-10-27 ENCOUNTER — Ambulatory Visit: Payer: Self-pay | Admitting: Internal Medicine

## 2024-10-29 ENCOUNTER — Ambulatory Visit: Admitting: Physical Medicine and Rehabilitation

## 2024-10-29 ENCOUNTER — Other Ambulatory Visit: Payer: Self-pay

## 2024-10-29 VITALS — BP 142/90 | HR 83

## 2024-10-29 DIAGNOSIS — M25552 Pain in left hip: Secondary | ICD-10-CM | POA: Diagnosis not present

## 2024-10-29 DIAGNOSIS — M5416 Radiculopathy, lumbar region: Secondary | ICD-10-CM

## 2024-10-29 DIAGNOSIS — M1612 Unilateral primary osteoarthritis, left hip: Secondary | ICD-10-CM

## 2024-10-29 MED ORDER — METHYLPREDNISOLONE ACETATE 40 MG/ML IJ SUSP: 40.0000 mg | Freq: Once | INTRAMUSCULAR | Status: AC

## 2024-10-29 NOTE — Progress Notes (Signed)
 Patient comes in for left S1 TF. Takes Diclofenac  and Tylenol . Would like for Dr. Eldonna to review MRI Left hip.      Pain Scale   Average Pain 5    +BT- Eliquis , -Dye Allergies.

## 2024-10-29 NOTE — Patient Instructions (Addendum)
 Jim Graham

## 2024-11-03 ENCOUNTER — Ambulatory Visit: Admitting: Sports Medicine

## 2024-11-04 ENCOUNTER — Ambulatory Visit: Admitting: Orthopedic Surgery

## 2024-11-05 ENCOUNTER — Encounter: Payer: Self-pay | Admitting: Orthopedic Surgery

## 2024-11-05 ENCOUNTER — Ambulatory Visit: Admitting: Orthopedic Surgery

## 2024-11-05 DIAGNOSIS — M25552 Pain in left hip: Secondary | ICD-10-CM

## 2024-11-05 DIAGNOSIS — M1612 Unilateral primary osteoarthritis, left hip: Secondary | ICD-10-CM

## 2024-11-05 MED ORDER — TRAMADOL HCL 50 MG PO TABS: ORAL_TABLET | ORAL | 0 refills | Status: AC

## 2024-11-05 NOTE — Progress Notes (Signed)
 "  Office Visit Note   Patient: Jim Graham           Date of Birth: 1955/11/02           MRN: [id number] Visit Date: 11/05/2024 Requested by: Loreli Kins, MD [address] [place],  [place] [address] PCP: Loreli Kins, MD  Subjective: Chief Complaint  Patient presents with   Other    Follow up to review scan    HPI: Jim Graham is a 69 y.o. male who presents to the office reporting left hip pain.  Here to follow-up and review his MRI scan.  Patient states his left hip is livable for a while.  New mattress has helped him some.  He has new onset atrial fibrillation and is on Eliquis .  Seeing his cardiologist for possible consideration of cardioversion in early February.  Since he was last seen he has had an MRI scan which is reviewed with the patient.  Does show significant left hip arthritis along with a cyst along the anterior aspect of the femoral neck consistent with intra-articular cyst.  Which has extended out along the femoral neck..                ROS: All systems reviewed are negative as they relate to the chief complaint within the history of present illness.  Patient denies fevers or chills.  Assessment & Plan: Visit Diagnoses:  1. Pain in left hip   2. Unilateral primary osteoarthritis, left hip     Plan: Impression is significant left hip arthritis.  Baseball season is coming up.  Yassir would like to be functional for that part of his season.  I think would be good for him from an elective joint replacement standpoint to get his cardioversion done and to be off Eliquis  before he has his hip replacement done.  I will have him follow-up with Dr. Vernetta who can coordinate with Dr. Mona who is his cardiologist.  Tramadol  refilled as he really should not be taking a lot of diclofenac  with the Eliquis .  Follow-Up Instructions: No follow-ups on file.   Orders:  Orders Placed This Encounter  Procedures   Ambulatory referral to Orthopedic Surgery    Meds ordered this encounter  Medications   traMADol  (ULTRAM ) 50 MG tablet    Sig: 1 po q hs prn pain    Dispense:  30 tablet    Refill:  0      Procedures: No procedures performed   Clinical Data: No additional findings.  Objective: Vital Signs: There were no vitals taken for this visit.  Physical Exam:  Constitutional: Patient appears well-developed HEENT:  Head: Normocephalic Eyes:EOM are normal Neck: Normal range of motion Cardiovascular: Normal rate Pulmonary/chest: Effort normal Neurologic: Patient is alert Skin: Skin is warm Psychiatric: Patient has normal mood and affect  Ortho Exam: Ortho exam demonstrates Trendelenburg gait to the left.  There is some pain with internal/external rotation of that left hip.  Hip flexion abduction adduction strength is intact.  No muscle atrophy in the legs.  Pedal pulses palpable.  Ankle dorsiflexion intact.  Specialty Comments:  MRI LUMBAR SPINE WITHOUT CONTRAST   TECHNIQUE: Multiplanar, multisequence MR imaging of the lumbar spine was performed. No intravenous contrast was administered.   COMPARISON:  MRI 08/12/2018   FINDINGS: Segmentation:  5 lumbar type vertebral bodies.   Alignment:  No malalignment.   Vertebrae: No fracture or focal bone lesion. Resolution of previously seen degenerative edema of the  L5 and S1 vertebral bodies, present on the 2019 study. Marrow changes are now chronic without active edema.   Conus medullaris and cauda equina: Conus extends to the L1 level. Conus and cauda equina appear normal.   Paraspinal and other soft tissues: Negative   Disc levels:   Minimal non-compressive disc bulges from T11-12 through L2-3. No compressive stenosis.   L3-4: Mild bulging of the disc. Facet and ligamentous hypertrophy. Mild stenosis of both lateral recesses but without visible neural compression.   L4-5: Chronic disc degeneration with loss of disc height. Endplate osteophytes and bulging of  the disc. Facet and ligamentous hypertrophy, worse on the left. Moderate multifactorial stenosis. Some potential for neural compression in the lateral recesses, with stenosis slightly worse on the left than the right.   L5-S1: Chronic disc degeneration with loss of disc height. Endplate osteophytes and bulging of the disc more prominent towards the left. Facet degeneration and hypertrophy. Stenosis of the lateral recesses and neural foramina, left more than right. Some potential for neural compression at this level, particularly of the exiting left L5 nerve. This stenosis has worsened slightly since 2019.   IMPRESSION: 1. L3-4: Disc bulge. Facet and ligamentous hypertrophy. Mild stenosis of both lateral recesses but without visible neural compression. 2. L4-5: Chronic disc degeneration with loss of disc height. Endplate osteophytes and bulging of the disc. Facet and ligamentous hypertrophy, worse on the left. Moderate multifactorial stenosis. Some potential for neural compression in the lateral recesses, with stenosis slightly worse on the left than the right. 3. L5-S1: Chronic disc degeneration with loss of disc height. Endplate osteophytes and bulging of the disc more prominent towards the left. Facet degeneration and hypertrophy. Stenosis of the lateral recesses and neural foramina, left more than right. Some potential for neural compression at this level, particularly of the exiting left L5 nerve. This stenosis has worsened slightly since 2019.     Electronically Signed   By: Oneil Officer M.D.   On: 06/25/2024 15:29  Imaging: No results found.   PMFS History: Patient Active Problem List   Diagnosis Date Noted   Chronic rhinitis 12/07/2023   Deviated nasal septum 12/07/2023   Hypertrophy of nasal turbinates 12/07/2023   Other specified disorders of eustachian tube, left ear 12/07/2023   Complete tear of right rotator cuff    Degenerative superior labral  anterior-to-posterior (SLAP) tear of right shoulder    Shoulder arthritis    Chronic right shoulder pain 08/31/2021   History of arthroscopy of left shoulder 12/21/2016   GERD (gastroesophageal reflux disease) 05/25/2015   Agatston coronary artery calcium  score less than 100 02/08/2015   ED (erectile dysfunction) 02/08/2015   Pulmonary nodules 02/08/2015   Dilated aortic root 02/08/2015   Chest pain 08/24/2011   Hyperlipidemia 08/24/2011   Elevated BP 08/24/2011   History of palpitations 12/16/2010   Past Medical History:  Diagnosis Date   GERD (gastroesophageal reflux disease)    Hyperlipidemia    Palpitations     Family History  Adopted: Yes  Problem Relation Age of Onset   Heart attack Paternal Grandfather     Past Surgical History:  Procedure Laterality Date   bicep re-attachment     NASAL SEPTOPLASTY W/ TURBINOPLASTY Bilateral 12/31/2023   Procedure: SEPTOPLASTY, NOSE, WITH NASAL TURBINATE REDUCTION;  Surgeon: Karis Clunes, MD;  Location: [facility];  Service: ENT;  Laterality: Bilateral;   NASAL SINUS SURGERY     shoulder arthroscopy Left 2018   SHOULDER ARTHROSCOPY Right 08/26/2020  SHOULDER ARTHROSCOPY WITH DEBRIDEMENT AND BICEP TENDON REPAIR Right 10/18/2021   Procedure: RIGHT SHOULDER ARTHROSCOPY, DEBRIDEMENT, BICEPS TENODESIS;  Surgeon: Addie Cordella Hamilton, MD;  Location[facility] OR;  Service: Orthopedics;  Laterality: Right;   Social History   Occupational History   Occupation: Fireman    CommentLexicographer  Tobacco Use   Smoking status: Never   Smokeless tobacco: Never  Vaping Use   Vaping status: Never Used  Substance and Sexual Activity   Alcohol use: No   Drug use: No   Sexual activity: Not on file        "

## 2024-11-09 ENCOUNTER — Encounter: Payer: Self-pay | Admitting: Orthopedic Surgery

## 2024-11-09 ENCOUNTER — Encounter: Payer: Self-pay | Admitting: Physical Medicine and Rehabilitation

## 2024-11-09 NOTE — Progress Notes (Signed)
 "  Jim Graham - 69 y.o. male MRN [id number]  Date of birth: 02-17-1956  Office Visit Note: Visit Date: 10/29/2024 PCP: Loreli Kins, MD Referred by: Loreli Kins, MD  Subjective: Chief Complaint  Patient presents with   Lower Back - Pain   Left Hip - Pain   HPI: Jim Graham is a 69 y.o. male who comes in today for evaluation and management of left low back and hip and thigh pain.  He continues to have some symptoms corresponding with his lower back which has been a chronic issue but also now more of a issue with his left hip.  By way of review he has significant increase in pain with left hip intra-articular injection with ultrasound guidance by Dr. Burnetta.  Subsequent MRI of his hip and spine were completed.  I went over these with him today in brief.  His hip is much worse now than his back he is and I do think he is gena need a hip replacement.  We discussed this and decided not to complete any type of back injection at this point as I do feel like most of his pain is from his hip.  Unfortunately he is on Eliquis  now with recent onset of atrial fibrillation.  He is pretty active guy he will follow-up with Dr. Addie and possibly Dr. Vernetta.     ROS Otherwise per HPI.  Assessment & Plan: Visit Diagnoses:    ICD-10-CM   1. Pain in left hip  M25.552     2. Lumbar radiculopathy  M54.16 Epidural Steroid injection    methylPREDNISolone  acetate (DEPO-MEDROL ) injection 40 mg    CANCELED: XR C-ARM NO REPORT    3. Unilateral primary osteoarthritis, left hip  M16.12        Plan: No additional findings.   Meds & Orders:  Meds ordered this encounter  Medications   methylPREDNISolone  acetate (DEPO-MEDROL ) injection 40 mg    Orders Placed This Encounter  Procedures   Epidural Steroid injection    Follow-up: No follow-ups on file.   Procedures: No procedures performed      Clinical History: MRI LUMBAR SPINE WITHOUT CONTRAST   TECHNIQUE: Multiplanar,  multisequence MR imaging of the lumbar spine was performed. No intravenous contrast was administered.   COMPARISON:  MRI 08/12/2018   FINDINGS: Segmentation:  5 lumbar type vertebral bodies.   Alignment:  No malalignment.   Vertebrae: No fracture or focal bone lesion. Resolution of previously seen degenerative edema of the L5 and S1 vertebral bodies, present on the 2019 study. Marrow changes are now chronic without active edema.   Conus medullaris and cauda equina: Conus extends to the L1 level. Conus and cauda equina appear normal.   Paraspinal and other soft tissues: Negative   Disc levels:   Minimal non-compressive disc bulges from T11-12 through L2-3. No compressive stenosis.   L3-4: Mild bulging of the disc. Facet and ligamentous hypertrophy. Mild stenosis of both lateral recesses but without visible neural compression.   L4-5: Chronic disc degeneration with loss of disc height. Endplate osteophytes and bulging of the disc. Facet and ligamentous hypertrophy, worse on the left. Moderate multifactorial stenosis. Some potential for neural compression in the lateral recesses, with stenosis slightly worse on the left than the right.   L5-S1: Chronic disc degeneration with loss of disc height. Endplate osteophytes and bulging of the disc more prominent towards the left. Facet degeneration and hypertrophy. Stenosis of the lateral recesses and neural foramina, left more than  right. Some potential for neural compression at this level, particularly of the exiting left L5 nerve. This stenosis has worsened slightly since 2019.   IMPRESSION: 1. L3-4: Disc bulge. Facet and ligamentous hypertrophy. Mild stenosis of both lateral recesses but without visible neural compression. 2. L4-5: Chronic disc degeneration with loss of disc height. Endplate osteophytes and bulging of the disc. Facet and ligamentous hypertrophy, worse on the left. Moderate multifactorial stenosis. Some  potential for neural compression in the lateral recesses, with stenosis slightly worse on the left than the right. 3. L5-S1: Chronic disc degeneration with loss of disc height. Endplate osteophytes and bulging of the disc more prominent towards the left. Facet degeneration and hypertrophy. Stenosis of the lateral recesses and neural foramina, left more than right. Some potential for neural compression at this level, particularly of the exiting left L5 nerve. This stenosis has worsened slightly since 2019.     Electronically Signed   By: Oneil Officer M.D.   On: 06/25/2024 15:29   He reports that he has never smoked. He has never used smokeless tobacco. No results for input(s): HGBA1C, LABURIC in the last 8760 hours.  Objective:  VS:  HT:    WT:   BMI:     BP:(!) 142/90  HR:83bpm  TEMP: ( )  RESP:  Physical Exam Vitals and nursing note reviewed.  Constitutional:      General: He is not in acute distress.    Appearance: Normal appearance. He is not ill-appearing.  HENT:     Head: Normocephalic and atraumatic.     Right Ear: External ear normal.     Left Ear: External ear normal.     Nose: No congestion.  Eyes:     Extraocular Movements: Extraocular movements intact.  Cardiovascular:     Rate and Rhythm: Normal rate.     Pulses: Normal pulses.  Pulmonary:     Effort: Pulmonary effort is normal. No respiratory distress.  Abdominal:     General: There is no distension.     Palpations: Abdomen is soft.  Musculoskeletal:        General: No tenderness or signs of injury.     Cervical back: Neck supple.     Right lower leg: No edema.     Left lower leg: No edema.     Comments: Patient has good distal strength without clonus.  He has an antalgic gait on the left.  Pain with hip rotation internal and external.  Some pain going from sit to stand.  Skin:    Findings: No erythema or rash.  Neurological:     General: No focal deficit present.     Mental Status: He is alert  and oriented to person, place, and time.     Cranial Nerves: No cranial nerve deficit.     Sensory: No sensory deficit.     Motor: No weakness or abnormal muscle tone.     Coordination: Coordination normal.     Gait: Gait abnormal.  Psychiatric:        Mood and Affect: Mood normal.        Behavior: Behavior normal.     Ortho Exam  Imaging: No results found.  Past Medical/Family/Surgical/Social History: Medications & Allergies reviewed per EMR, new medications updated. Patient Active Problem List   Diagnosis Date Noted   Chronic rhinitis 12/07/2023   Deviated nasal septum 12/07/2023   Hypertrophy of nasal turbinates 12/07/2023   Other specified disorders of eustachian tube, left ear 12/07/2023  Complete tear of right rotator cuff    Degenerative superior labral anterior-to-posterior (SLAP) tear of right shoulder    Shoulder arthritis    Chronic right shoulder pain 08/31/2021   History of arthroscopy of left shoulder 12/21/2016   GERD (gastroesophageal reflux disease) 05/25/2015   Agatston coronary artery calcium  score less than 100 02/08/2015   ED (erectile dysfunction) 02/08/2015   Pulmonary nodules 02/08/2015   Dilated aortic root 02/08/2015   Chest pain 08/24/2011   Hyperlipidemia 08/24/2011   Elevated BP 08/24/2011   History of palpitations 12/16/2010   Past Medical History:  Diagnosis Date   GERD (gastroesophageal reflux disease)    Hyperlipidemia    Palpitations    Family History  Adopted: Yes  Problem Relation Age of Onset   Heart attack Paternal Grandfather    Past Surgical History:  Procedure Laterality Date   bicep re-attachment     NASAL SEPTOPLASTY W/ TURBINOPLASTY Bilateral 12/31/2023   Procedure: SEPTOPLASTY, NOSE, WITH NASAL TURBINATE REDUCTION;  Surgeon: Karis Clunes, MD;  Location: [facility];  Service: ENT;  Laterality: Bilateral;   NASAL SINUS SURGERY     shoulder arthroscopy Left 2018   SHOULDER ARTHROSCOPY Right 08/26/2020    SHOULDER ARTHROSCOPY WITH DEBRIDEMENT AND BICEP TENDON REPAIR Right 10/18/2021   Procedure: RIGHT SHOULDER ARTHROSCOPY, DEBRIDEMENT, BICEPS TENODESIS;  Surgeon: Addie Cordella Hamilton, MD;  Location: [facility] OR;  Service: Orthopedics;  Laterality: Right;   Social History   Occupational History   Occupation: Fireman    CommentLexicographer  Tobacco Use   Smoking status: Never   Smokeless tobacco: Never  Vaping Use   Vaping status: Never Used  Substance and Sexual Activity   Alcohol use: No   Drug use: No   Sexual activity: Not on file    "

## 2024-11-19 ENCOUNTER — Other Ambulatory Visit: Payer: Self-pay

## 2024-11-19 ENCOUNTER — Other Ambulatory Visit (HOSPITAL_COMMUNITY): Payer: Self-pay

## 2024-11-19 ENCOUNTER — Ambulatory Visit: Admitting: Orthopaedic Surgery

## 2024-11-19 DIAGNOSIS — M25552 Pain in left hip: Secondary | ICD-10-CM | POA: Diagnosis not present

## 2024-11-19 DIAGNOSIS — M1612 Unilateral primary osteoarthritis, left hip: Secondary | ICD-10-CM

## 2024-11-19 NOTE — Progress Notes (Signed)
 The patient is a very active 69 year old gentleman last seen in the past for shoulder and referred him to my partner Dr. Addie.  He had been dealing with lumbar spine issues and had a left L4-L5 epidural ESI back in October with Dr. Eldonna.  He had been having enough left hip pain that he was sent for MRI of the left hip which showed significant cartilage wear and subchondral edema in the weightbearing surface of the femoral head and acetabulum on the left side.  Dr. Addie has sent him back our way to discuss the potential at some point for a left total hip arthroplasty to treat the pain from his osteoarthritis of that left hip.  Dr. Addie had gone over the MRI studies with the patient and I also reviewed these MRI studies today.  He is on Eliquis  and does have an appointment upcoming with Dr. Mona with cardiology to discuss a cardiac intervention.  The patient would like to proceed with a hip replacement as he is cleared from a cardiology standpoint for proceeding with a left hip replacement.  His left hip pain is daily and it is detrimentally affecting his mobility, his quality of life and his actives daily living.  I was able to review his medications and past medical history within epic.  He is also on weight loss medication and knows that he would need to be off of blood thinners at least 3 days prior to surgery and off of any weight loss medications at least a week prior to surgery.  On exam he does have slight tender lumbar gait.  There is stiffness with rotation of his left hip and pain in the groin.  A standing AP pelvis shows severe end-stage arthritis of the left hip with flattening of the femoral head and bone-on-bone wear with joint space loss and osteophytes around the hip.  This is confirmed with a MRI of his left hip that also shows significant subchondral edema throughout the weightbearing surface of the femoral head and neck as well as the acetabulum.  I did show him a hip replacement model and  discussed in length and detail hip replacement surgery.  We discussed the risks and benefits of surgery and what to expect from an intraoperative and postoperative standpoint.  I went over his films with him and gave him a handout about hip replacement surgery.  He does have our surgery scheduler's card.  As soon as we have some correspondence in terms of when we can proceed with an elective left total hip arthroplasty to treat his severe left hip arthritis and pain we can schedule him.  We will need guidance from cardiology as to timing for scheduling this surgery.  Patient understands that as well.

## 2024-11-21 NOTE — Telephone Encounter (Signed)
 Pt has been scheduled for DCCV with Dr. Jeffrie on 12/01/24.  He has an appointment with Dr. Mona on 11/26/24 and at that time his H&P will be updated and he can get instructions, EKG and labs.  Mychart message to patient to let him know.

## 2024-11-26 ENCOUNTER — Ambulatory Visit (HOSPITAL_BASED_OUTPATIENT_CLINIC_OR_DEPARTMENT_OTHER): Admitting: Internal Medicine

## 2024-12-01 ENCOUNTER — Encounter (HOSPITAL_COMMUNITY): Payer: Self-pay

## 2024-12-01 ENCOUNTER — Ambulatory Visit (HOSPITAL_COMMUNITY): Admit: 2024-12-01 | Admitting: Cardiology

## 2025-01-21 ENCOUNTER — Ambulatory Visit: Admitting: Orthopedic Surgery

## 2025-01-28 ENCOUNTER — Ambulatory Visit (INDEPENDENT_AMBULATORY_CARE_PROVIDER_SITE_OTHER): Admitting: Otolaryngology

## 2025-02-04 ENCOUNTER — Ambulatory Visit: Admitting: Orthopedic Surgery
